# Patient Record
Sex: Male | Born: 1940 | Race: Black or African American | Hispanic: No | Smoking: Former smoker
Health system: Southern US, Community
[De-identification: ages and names within clinical notes are randomized; demographics above are authoritative.]

## PROBLEM LIST (undated history)

## (undated) DIAGNOSIS — H409 Unspecified glaucoma: Secondary | ICD-10-CM

## (undated) DIAGNOSIS — F79 Unspecified intellectual disabilities: Secondary | ICD-10-CM

## (undated) DIAGNOSIS — H3400 Transient retinal artery occlusion, unspecified eye: Secondary | ICD-10-CM

## (undated) DIAGNOSIS — I10 Essential (primary) hypertension: Secondary | ICD-10-CM

## (undated) DIAGNOSIS — D472 Monoclonal gammopathy: Secondary | ICD-10-CM

## (undated) DIAGNOSIS — D518 Other vitamin B12 deficiency anemias: Secondary | ICD-10-CM

## (undated) DIAGNOSIS — N259 Disorder resulting from impaired renal tubular function, unspecified: Secondary | ICD-10-CM

## (undated) DIAGNOSIS — D638 Anemia in other chronic diseases classified elsewhere: Secondary | ICD-10-CM

## (undated) DIAGNOSIS — J984 Other disorders of lung: Secondary | ICD-10-CM

## (undated) HISTORY — DX: Transient retinal artery occlusion, unspecified eye: H34.00

## (undated) HISTORY — DX: Monoclonal gammopathy: D47.2

## (undated) HISTORY — DX: Other disorders of lung: J98.4

## (undated) HISTORY — DX: Essential (primary) hypertension: I10

## (undated) HISTORY — DX: Unspecified glaucoma: H40.9

## (undated) HISTORY — DX: Anemia in other chronic diseases classified elsewhere: D63.8

## (undated) HISTORY — DX: Unspecified intellectual disabilities: F79

## (undated) HISTORY — DX: Disorder resulting from impaired renal tubular function, unspecified: N25.9

## (undated) HISTORY — DX: Other vitamin B12 deficiency anemias: D51.8

---
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
## 1998-11-12 ENCOUNTER — Ambulatory Visit (HOSPITAL_BASED_OUTPATIENT_CLINIC_OR_DEPARTMENT_OTHER): Admission: RE | Admit: 1998-11-12 | Discharge: 1998-11-12 | Payer: Self-pay | Admitting: Oral Surgery

## 2005-03-24 ENCOUNTER — Ambulatory Visit: Payer: Self-pay | Admitting: Internal Medicine

## 2005-04-14 ENCOUNTER — Ambulatory Visit: Payer: Self-pay | Admitting: Internal Medicine

## 2005-04-24 ENCOUNTER — Ambulatory Visit: Payer: Self-pay | Admitting: Internal Medicine

## 2005-04-29 ENCOUNTER — Ambulatory Visit: Payer: Self-pay | Admitting: Hospitalist

## 2005-06-30 ENCOUNTER — Ambulatory Visit: Payer: Self-pay | Admitting: Internal Medicine

## 2005-06-30 ENCOUNTER — Ambulatory Visit (HOSPITAL_COMMUNITY): Admission: RE | Admit: 2005-06-30 | Discharge: 2005-06-30 | Payer: Self-pay | Admitting: Internal Medicine

## 2005-07-21 ENCOUNTER — Ambulatory Visit: Payer: Self-pay | Admitting: Internal Medicine

## 2005-09-01 ENCOUNTER — Ambulatory Visit: Payer: Self-pay | Admitting: Internal Medicine

## 2006-02-17 ENCOUNTER — Ambulatory Visit: Payer: Self-pay | Admitting: Internal Medicine

## 2006-05-08 DIAGNOSIS — F79 Unspecified intellectual disabilities: Secondary | ICD-10-CM

## 2006-05-08 DIAGNOSIS — N184 Chronic kidney disease, stage 4 (severe): Secondary | ICD-10-CM

## 2006-05-08 DIAGNOSIS — I10 Essential (primary) hypertension: Secondary | ICD-10-CM | POA: Insufficient documentation

## 2006-05-08 DIAGNOSIS — K649 Unspecified hemorrhoids: Secondary | ICD-10-CM | POA: Insufficient documentation

## 2006-05-08 DIAGNOSIS — D638 Anemia in other chronic diseases classified elsewhere: Secondary | ICD-10-CM

## 2006-05-08 DIAGNOSIS — H409 Unspecified glaucoma: Secondary | ICD-10-CM | POA: Insufficient documentation

## 2006-05-08 DIAGNOSIS — N259 Disorder resulting from impaired renal tubular function, unspecified: Secondary | ICD-10-CM

## 2006-05-08 HISTORY — DX: Disorder resulting from impaired renal tubular function, unspecified: N25.9

## 2006-05-08 HISTORY — DX: Unspecified intellectual disabilities: F79

## 2006-05-08 HISTORY — DX: Anemia in other chronic diseases classified elsewhere: D63.8

## 2006-05-08 HISTORY — DX: Essential (primary) hypertension: I10

## 2006-05-08 HISTORY — DX: Unspecified glaucoma: H40.9

## 2006-10-27 ENCOUNTER — Telehealth: Payer: Self-pay | Admitting: *Deleted

## 2007-01-05 ENCOUNTER — Ambulatory Visit: Payer: Self-pay | Admitting: Internal Medicine

## 2007-01-05 DIAGNOSIS — R011 Cardiac murmur, unspecified: Secondary | ICD-10-CM

## 2007-01-06 ENCOUNTER — Encounter (INDEPENDENT_AMBULATORY_CARE_PROVIDER_SITE_OTHER): Payer: Self-pay | Admitting: Internal Medicine

## 2007-01-12 ENCOUNTER — Encounter (INDEPENDENT_AMBULATORY_CARE_PROVIDER_SITE_OTHER): Payer: Self-pay | Admitting: Internal Medicine

## 2007-01-12 DIAGNOSIS — D518 Other vitamin B12 deficiency anemias: Secondary | ICD-10-CM

## 2007-01-12 HISTORY — DX: Other vitamin B12 deficiency anemias: D51.8

## 2007-01-14 ENCOUNTER — Telehealth: Payer: Self-pay | Admitting: *Deleted

## 2007-01-14 ENCOUNTER — Encounter: Payer: Self-pay | Admitting: *Deleted

## 2007-01-14 ENCOUNTER — Ambulatory Visit: Payer: Self-pay | Admitting: Internal Medicine

## 2007-01-15 ENCOUNTER — Ambulatory Visit: Payer: Self-pay | Admitting: Hospitalist

## 2007-01-18 ENCOUNTER — Ambulatory Visit: Payer: Self-pay | Admitting: Internal Medicine

## 2007-01-19 ENCOUNTER — Ambulatory Visit: Payer: Self-pay | Admitting: Internal Medicine

## 2007-01-20 ENCOUNTER — Ambulatory Visit: Payer: Self-pay | Admitting: Internal Medicine

## 2007-01-21 ENCOUNTER — Ambulatory Visit: Payer: Self-pay | Admitting: Internal Medicine

## 2007-01-22 ENCOUNTER — Ambulatory Visit: Payer: Self-pay | Admitting: Internal Medicine

## 2007-01-29 ENCOUNTER — Ambulatory Visit: Payer: Self-pay | Admitting: Hospitalist

## 2007-02-05 ENCOUNTER — Ambulatory Visit: Payer: Self-pay | Admitting: Infectious Diseases

## 2007-02-12 ENCOUNTER — Ambulatory Visit: Payer: Self-pay | Admitting: *Deleted

## 2007-02-19 ENCOUNTER — Ambulatory Visit: Payer: Self-pay | Admitting: Infectious Diseases

## 2007-02-26 ENCOUNTER — Ambulatory Visit: Payer: Self-pay | Admitting: Internal Medicine

## 2007-05-19 ENCOUNTER — Encounter (INDEPENDENT_AMBULATORY_CARE_PROVIDER_SITE_OTHER): Payer: Self-pay | Admitting: Internal Medicine

## 2007-05-19 ENCOUNTER — Ambulatory Visit: Payer: Self-pay | Admitting: Internal Medicine

## 2007-05-19 DIAGNOSIS — R259 Unspecified abnormal involuntary movements: Secondary | ICD-10-CM | POA: Insufficient documentation

## 2007-05-19 LAB — CONVERTED CEMR LAB
Calcium: 8.8 mg/dL (ref 8.4–10.5)
Glucose, Bld: 95 mg/dL (ref 70–99)
MCHC: 33.7 g/dL (ref 30.0–36.0)
MCV: 92.8 fL (ref 78.0–100.0)
Platelets: 197 10*3/uL (ref 150–400)
Potassium: 4.3 meq/L (ref 3.5–5.3)
RBC: 3.61 M/uL — ABNORMAL LOW (ref 4.22–5.81)
RDW: 13 % (ref 11.5–15.5)
Sodium: 144 meq/L (ref 135–145)
TSH: 1.296 microintl units/mL (ref 0.350–5.50)
Vitamin B-12: >2000 pg/mL — ABNORMAL HIGH (ref 211–911)

## 2007-06-18 ENCOUNTER — Ambulatory Visit: Payer: Self-pay | Admitting: Internal Medicine

## 2007-07-19 ENCOUNTER — Ambulatory Visit: Payer: Self-pay | Admitting: *Deleted

## 2007-07-22 ENCOUNTER — Encounter (INDEPENDENT_AMBULATORY_CARE_PROVIDER_SITE_OTHER): Payer: Self-pay | Admitting: Internal Medicine

## 2007-07-22 ENCOUNTER — Ambulatory Visit: Payer: Self-pay | Admitting: *Deleted

## 2007-07-22 LAB — CONVERTED CEMR LAB
BUN: 23 mg/dL (ref 6–23)
Chloride: 106 meq/L (ref 96–112)
Cholesterol: 150 mg/dL (ref 0–200)
HDL: 52 mg/dL (ref >39)
Potassium: 4.6 meq/L (ref 3.5–5.3)
Triglycerides: 58 mg/dL (ref <150)
VLDL: 12 mg/dL (ref 0–40)

## 2007-08-05 ENCOUNTER — Ambulatory Visit: Payer: Self-pay | Admitting: Infectious Disease

## 2007-08-17 ENCOUNTER — Ambulatory Visit: Payer: Self-pay | Admitting: Internal Medicine

## 2007-08-17 ENCOUNTER — Encounter (INDEPENDENT_AMBULATORY_CARE_PROVIDER_SITE_OTHER): Payer: Self-pay | Admitting: Internal Medicine

## 2007-08-17 LAB — CONVERTED CEMR LAB
HDL: 48 mg/dL (ref >39)
LDL Cholesterol: 90 mg/dL (ref 0–99)
Total CHOL/HDL Ratio: 3

## 2007-09-01 ENCOUNTER — Encounter (INDEPENDENT_AMBULATORY_CARE_PROVIDER_SITE_OTHER): Payer: Self-pay | Admitting: Internal Medicine

## 2007-09-01 ENCOUNTER — Ambulatory Visit (HOSPITAL_COMMUNITY): Admission: RE | Admit: 2007-09-01 | Discharge: 2007-09-01 | Payer: Self-pay | Admitting: Infectious Disease

## 2007-09-01 ENCOUNTER — Ambulatory Visit: Payer: Self-pay | Admitting: Infectious Disease

## 2007-09-01 DIAGNOSIS — R05 Cough: Secondary | ICD-10-CM

## 2007-09-01 DIAGNOSIS — R059 Cough, unspecified: Secondary | ICD-10-CM | POA: Insufficient documentation

## 2007-09-01 LAB — CONVERTED CEMR LAB
BUN: 22 mg/dL (ref 6–23)
CO2: 24 meq/L (ref 19–32)
Chloride: 109 meq/L (ref 96–112)
Glucose, Bld: 89 mg/dL (ref 70–99)
Lymphocytes Relative: 51 % — ABNORMAL HIGH (ref 12–46)
Lymphs Abs: 1.8 10*3/uL (ref 0.7–4.0)
Monocytes Relative: 5 % (ref 3–12)
Neutrophils Relative %: 40 % — ABNORMAL LOW (ref 43–77)
Platelets: 225 10*3/uL (ref 150–400)
Potassium: 4.7 meq/L (ref 3.5–5.3)
RBC: 3.77 M/uL — ABNORMAL LOW (ref 4.22–5.81)
Sodium: 145 meq/L (ref 135–145)
WBC: 3.5 10*3/uL — ABNORMAL LOW (ref 4.0–10.5)

## 2007-09-15 ENCOUNTER — Ambulatory Visit: Payer: Self-pay | Admitting: *Deleted

## 2007-09-15 ENCOUNTER — Encounter (INDEPENDENT_AMBULATORY_CARE_PROVIDER_SITE_OTHER): Payer: Self-pay | Admitting: Internal Medicine

## 2007-10-15 ENCOUNTER — Ambulatory Visit: Payer: Self-pay | Admitting: Internal Medicine

## 2007-10-15 ENCOUNTER — Encounter (INDEPENDENT_AMBULATORY_CARE_PROVIDER_SITE_OTHER): Payer: Self-pay | Admitting: Internal Medicine

## 2007-10-15 DIAGNOSIS — R911 Solitary pulmonary nodule: Secondary | ICD-10-CM | POA: Insufficient documentation

## 2007-10-15 DIAGNOSIS — J984 Other disorders of lung: Secondary | ICD-10-CM

## 2007-10-15 DIAGNOSIS — D472 Monoclonal gammopathy: Secondary | ICD-10-CM

## 2007-10-15 HISTORY — DX: Other disorders of lung: J98.4

## 2007-10-15 HISTORY — DX: Monoclonal gammopathy: D47.2

## 2007-10-15 LAB — CONVERTED CEMR LAB
BUN: 29 mg/dL — ABNORMAL HIGH (ref 6–23)
CO2: 25 meq/L (ref 19–32)
Chloride: 107 meq/L (ref 96–112)
Potassium: 4.8 meq/L (ref 3.5–5.3)

## 2007-10-16 ENCOUNTER — Ambulatory Visit (HOSPITAL_COMMUNITY): Admission: RE | Admit: 2007-10-16 | Discharge: 2007-10-16 | Payer: Self-pay | Admitting: Internal Medicine

## 2007-10-18 ENCOUNTER — Encounter (INDEPENDENT_AMBULATORY_CARE_PROVIDER_SITE_OTHER): Payer: Self-pay | Admitting: Internal Medicine

## 2007-10-22 ENCOUNTER — Encounter: Admission: RE | Admit: 2007-10-22 | Discharge: 2007-10-22 | Payer: Self-pay | Admitting: Internal Medicine

## 2007-10-26 ENCOUNTER — Ambulatory Visit: Payer: Self-pay | Admitting: Oncology

## 2007-11-10 LAB — CBC WITH DIFFERENTIAL/PLATELET
BASO%: 0.6 % (ref 0.0–2.0)
EOS%: 3.4 % (ref 0.0–7.0)
Eosinophils Absolute: 0.1 10*3/uL (ref 0.0–0.5)
LYMPH%: 48 % (ref 14.0–48.0)
MCH: 32 pg (ref 28.0–33.4)
MCHC: 33.6 g/dL (ref 32.0–35.9)
MCV: 95.4 fL (ref 81.6–98.0)
MONO%: 6.4 % (ref 0.0–13.0)
NEUT#: 1.5 10*3/uL (ref 1.5–6.5)
RBC: 3.63 10*6/uL — ABNORMAL LOW (ref 4.20–5.71)
RDW: 12.3 % (ref 11.2–14.6)

## 2007-11-10 LAB — ERYTHROCYTE SEDIMENTATION RATE: Sed Rate: 16 mm/hr (ref 0–20)

## 2007-11-12 LAB — COMPREHENSIVE METABOLIC PANEL
ALT: 13 U/L (ref 0–53)
AST: 14 U/L (ref 0–37)
Alkaline Phosphatase: 68 U/L (ref 39–117)
Creatinine, Ser: 1.72 mg/dL — ABNORMAL HIGH (ref 0.40–1.50)
Sodium: 146 mEq/L — ABNORMAL HIGH (ref 135–145)
Total Bilirubin: 0.4 mg/dL (ref 0.3–1.2)
Total Protein: 6.4 g/dL (ref 6.0–8.3)

## 2007-11-12 LAB — SPEP & IFE WITH QIG
Alpha-1-Globulin: 4.2 % (ref 2.9–4.9)
Alpha-2-Globulin: 9.3 % (ref 7.1–11.8)
Beta 2: 11.2 % — ABNORMAL HIGH (ref 3.2–6.5)
Gamma Globulin: 13.2 % (ref 11.1–18.8)
IgG (Immunoglobin G), Serum: 1040 mg/dL (ref 694–1618)
IgM, Serum: 38 mg/dL — ABNORMAL LOW (ref 60–263)
M-Spike, %: 0.29 g/dL

## 2007-11-12 LAB — BETA 2 MICROGLOBULIN, SERUM: Beta-2 Microglobulin: 2.16 mg/L — ABNORMAL HIGH (ref 1.01–1.73)

## 2007-11-12 LAB — KAPPA/LAMBDA LIGHT CHAINS: Kappa:Lambda Ratio: 0.59 (ref 0.26–1.65)

## 2007-11-24 ENCOUNTER — Ambulatory Visit: Payer: Self-pay | Admitting: Internal Medicine

## 2007-11-24 ENCOUNTER — Encounter (INDEPENDENT_AMBULATORY_CARE_PROVIDER_SITE_OTHER): Payer: Self-pay | Admitting: Internal Medicine

## 2007-11-24 LAB — CONVERTED CEMR LAB
BUN: 27 mg/dL — ABNORMAL HIGH (ref 6–23)
Glucose, Bld: 96 mg/dL (ref 70–99)
Potassium: 5 meq/L (ref 3.5–5.3)

## 2007-11-24 LAB — UIFE/LIGHT CHAINS/TP QN, 24-HR UR
Albumin, U: DETECTED
Alpha 2, Urine: DETECTED — AB
Beta, Urine: DETECTED — AB
Free Lambda Lt Chains,Ur: 1.2 mg/dL — ABNORMAL HIGH (ref 0.08–1.01)
Gamma Globulin, Urine: DETECTED — AB
Total Protein, Urine-Ur/day: 81 mg/d (ref 10–140)
Volume, Urine: 700 mL

## 2007-12-24 ENCOUNTER — Encounter (INDEPENDENT_AMBULATORY_CARE_PROVIDER_SITE_OTHER): Payer: Self-pay | Admitting: Internal Medicine

## 2007-12-24 ENCOUNTER — Ambulatory Visit: Payer: Self-pay | Admitting: Internal Medicine

## 2007-12-24 DIAGNOSIS — H3400 Transient retinal artery occlusion, unspecified eye: Secondary | ICD-10-CM

## 2007-12-24 HISTORY — DX: Transient retinal artery occlusion, unspecified eye: H34.00

## 2008-01-04 ENCOUNTER — Ambulatory Visit: Payer: Self-pay | Admitting: Surgery

## 2008-01-04 ENCOUNTER — Ambulatory Visit (HOSPITAL_COMMUNITY): Admission: RE | Admit: 2008-01-04 | Discharge: 2008-01-04 | Payer: Self-pay | Admitting: Internal Medicine

## 2008-01-04 ENCOUNTER — Encounter (INDEPENDENT_AMBULATORY_CARE_PROVIDER_SITE_OTHER): Payer: Self-pay | Admitting: Internal Medicine

## 2008-03-09 ENCOUNTER — Encounter (INDEPENDENT_AMBULATORY_CARE_PROVIDER_SITE_OTHER): Payer: Self-pay | Admitting: Internal Medicine

## 2008-03-16 ENCOUNTER — Ambulatory Visit: Payer: Self-pay | Admitting: Internal Medicine

## 2008-03-16 ENCOUNTER — Encounter (INDEPENDENT_AMBULATORY_CARE_PROVIDER_SITE_OTHER): Payer: Self-pay | Admitting: *Deleted

## 2008-03-16 DIAGNOSIS — I499 Cardiac arrhythmia, unspecified: Secondary | ICD-10-CM | POA: Insufficient documentation

## 2008-03-16 LAB — CONVERTED CEMR LAB
BUN: 26 mg/dL — ABNORMAL HIGH (ref 6–23)
Calcium: 9.2 mg/dL (ref 8.4–10.5)
Glucose, Bld: 93 mg/dL (ref 70–99)
Sodium: 141 meq/L (ref 135–145)
TSH: 2.154 microintl units/mL (ref 0.350–4.50)

## 2008-03-31 ENCOUNTER — Ambulatory Visit: Payer: Self-pay | Admitting: Internal Medicine

## 2008-04-12 ENCOUNTER — Encounter (INDEPENDENT_AMBULATORY_CARE_PROVIDER_SITE_OTHER): Payer: Self-pay | Admitting: Internal Medicine

## 2008-04-12 ENCOUNTER — Ambulatory Visit: Payer: Self-pay | Admitting: Internal Medicine

## 2008-04-13 LAB — CONVERTED CEMR LAB
Calcium: 8.8 mg/dL (ref 8.4–10.5)
Chloride: 106 meq/L (ref 96–112)
Creatinine, Ser: 1.96 mg/dL — ABNORMAL HIGH (ref 0.40–1.50)

## 2008-05-03 ENCOUNTER — Ambulatory Visit: Payer: Self-pay | Admitting: Internal Medicine

## 2008-05-03 ENCOUNTER — Encounter (INDEPENDENT_AMBULATORY_CARE_PROVIDER_SITE_OTHER): Payer: Self-pay | Admitting: Internal Medicine

## 2008-05-03 LAB — CONVERTED CEMR LAB
BUN: 26 mg/dL — ABNORMAL HIGH (ref 6–23)
Chloride: 107 meq/L (ref 96–112)
Glucose, Bld: 93 mg/dL (ref 70–99)
Hemoglobin: 11.2 g/dL — ABNORMAL LOW (ref 13.0–17.0)
Potassium: 5.1 meq/L (ref 3.5–5.3)
RBC: 3.63 M/uL — ABNORMAL LOW (ref 4.22–5.81)

## 2008-05-11 ENCOUNTER — Ambulatory Visit (HOSPITAL_COMMUNITY): Admission: RE | Admit: 2008-05-11 | Discharge: 2008-05-11 | Payer: Self-pay | Admitting: Internal Medicine

## 2008-05-23 ENCOUNTER — Ambulatory Visit (HOSPITAL_BASED_OUTPATIENT_CLINIC_OR_DEPARTMENT_OTHER): Payer: 59 | Admitting: Oncology

## 2008-05-24 ENCOUNTER — Encounter (INDEPENDENT_AMBULATORY_CARE_PROVIDER_SITE_OTHER): Payer: Self-pay | Admitting: Internal Medicine

## 2008-05-24 ENCOUNTER — Ambulatory Visit: Payer: Self-pay | Admitting: Internal Medicine

## 2008-05-29 LAB — CONVERTED CEMR LAB
CO2: 26 meq/L (ref 19–32)
Calcium: 8.8 mg/dL (ref 8.4–10.5)
Creatinine, Ser: 1.6 mg/dL — ABNORMAL HIGH (ref 0.40–1.50)
Glucose, Bld: 94 mg/dL (ref 70–99)

## 2008-07-14 ENCOUNTER — Encounter (INDEPENDENT_AMBULATORY_CARE_PROVIDER_SITE_OTHER): Payer: Self-pay | Admitting: Internal Medicine

## 2008-10-03 ENCOUNTER — Emergency Department (HOSPITAL_COMMUNITY): Admission: EM | Admit: 2008-10-03 | Discharge: 2008-10-03 | Payer: Self-pay | Admitting: Family Medicine

## 2009-02-05 ENCOUNTER — Ambulatory Visit: Payer: Self-pay | Admitting: Internal Medicine

## 2009-02-05 LAB — CONVERTED CEMR LAB
ALT: 9 units/L (ref 0–53)
AST: 15 units/L (ref 0–37)
CO2: 27 meq/L (ref 19–32)
Cholesterol: 126 mg/dL (ref 0–200)
Creatinine, Ser: 1.56 mg/dL — ABNORMAL HIGH (ref 0.40–1.50)
HDL: 44 mg/dL (ref >39)
Total Bilirubin: 0.5 mg/dL (ref 0.3–1.2)
Total CHOL/HDL Ratio: 2.9
VLDL: 13 mg/dL (ref 0–40)

## 2009-02-06 ENCOUNTER — Ambulatory Visit: Payer: Self-pay | Admitting: Internal Medicine

## 2009-02-08 ENCOUNTER — Ambulatory Visit (HOSPITAL_COMMUNITY): Admission: RE | Admit: 2009-02-08 | Discharge: 2009-02-08 | Payer: Self-pay | Admitting: Internal Medicine

## 2009-02-19 ENCOUNTER — Ambulatory Visit: Payer: Self-pay | Admitting: Internal Medicine

## 2009-02-19 LAB — CONVERTED CEMR LAB: Crypto Ag: NEGATIVE

## 2009-03-06 ENCOUNTER — Ambulatory Visit: Payer: Self-pay | Admitting: Infectious Disease

## 2009-03-13 ENCOUNTER — Ambulatory Visit: Payer: Self-pay | Admitting: Internal Medicine

## 2009-03-13 DIAGNOSIS — J82 Pulmonary eosinophilia, not elsewhere classified: Secondary | ICD-10-CM

## 2009-03-13 DIAGNOSIS — J8289 Other pulmonary eosinophilia, not elsewhere classified: Secondary | ICD-10-CM | POA: Insufficient documentation

## 2009-04-10 ENCOUNTER — Ambulatory Visit: Payer: Self-pay | Admitting: Internal Medicine

## 2009-04-10 ENCOUNTER — Telehealth (INDEPENDENT_AMBULATORY_CARE_PROVIDER_SITE_OTHER): Payer: Self-pay | Admitting: *Deleted

## 2009-04-12 ENCOUNTER — Encounter: Payer: Self-pay | Admitting: Internal Medicine

## 2009-04-13 ENCOUNTER — Ambulatory Visit: Payer: Self-pay | Admitting: Internal Medicine

## 2009-04-24 ENCOUNTER — Ambulatory Visit: Payer: Self-pay | Admitting: Internal Medicine

## 2009-04-25 ENCOUNTER — Encounter (INDEPENDENT_AMBULATORY_CARE_PROVIDER_SITE_OTHER): Payer: Self-pay | Admitting: Internal Medicine

## 2009-04-25 LAB — CONVERTED CEMR LAB
BUN: 21 mg/dL (ref 6–23)
Chloride: 105 meq/L (ref 96–112)
Creatinine, Ser: 1.56 mg/dL — ABNORMAL HIGH (ref 0.40–1.50)
Glucose, Bld: 106 mg/dL — ABNORMAL HIGH (ref 70–99)

## 2009-04-30 ENCOUNTER — Ambulatory Visit: Payer: Self-pay | Admitting: Internal Medicine

## 2009-05-16 ENCOUNTER — Ambulatory Visit: Payer: Self-pay | Admitting: Internal Medicine

## 2009-05-22 ENCOUNTER — Encounter (INDEPENDENT_AMBULATORY_CARE_PROVIDER_SITE_OTHER): Payer: Self-pay | Admitting: Internal Medicine

## 2009-05-22 ENCOUNTER — Ambulatory Visit (HOSPITAL_COMMUNITY): Admission: RE | Admit: 2009-05-22 | Discharge: 2009-05-22 | Payer: Self-pay | Admitting: Internal Medicine

## 2009-05-22 ENCOUNTER — Ambulatory Visit: Payer: Self-pay | Admitting: Internal Medicine

## 2009-08-24 ENCOUNTER — Encounter (INDEPENDENT_AMBULATORY_CARE_PROVIDER_SITE_OTHER): Payer: Self-pay | Admitting: *Deleted

## 2009-10-12 ENCOUNTER — Ambulatory Visit: Payer: Self-pay | Admitting: Internal Medicine

## 2009-10-12 ENCOUNTER — Telehealth (INDEPENDENT_AMBULATORY_CARE_PROVIDER_SITE_OTHER): Payer: Self-pay | Admitting: *Deleted

## 2009-10-13 ENCOUNTER — Encounter: Payer: Self-pay | Admitting: Internal Medicine

## 2009-10-17 ENCOUNTER — Encounter: Payer: Self-pay | Admitting: Internal Medicine

## 2010-03-29 ENCOUNTER — Ambulatory Visit: Payer: Self-pay | Admitting: Internal Medicine

## 2010-04-15 ENCOUNTER — Ambulatory Visit: Payer: Self-pay | Admitting: Internal Medicine

## 2010-05-19 ENCOUNTER — Encounter: Payer: Self-pay | Admitting: Internal Medicine

## 2010-05-26 LAB — CONVERTED CEMR LAB
Albumin ELP: 58 % (ref 55.8–66.1)
Albumin, U: DETECTED %
Alpha 1, Urine: DETECTED % — AB
Alpha 2, Urine: DETECTED % — AB
Anti Nuclear Antibody(ANA): NEGATIVE
Free Lambda Lt Chains,Ur: 1.54 mg/dL — ABNORMAL HIGH (ref 0.08–1.01)
Gamma Globulin: 12.8 % (ref 11.1–18.8)
IgA: 558 mg/dL — ABNORMAL HIGH (ref 68–378)
IgM, Serum: 37 mg/dL — ABNORMAL LOW (ref 60–263)
Total Protein, Serum Electrophoresis: 6.5 g/dL (ref 6.0–8.3)

## 2010-05-30 NOTE — Assessment & Plan Note (Signed)
Summary: FLU SHOT/CH  Nurse Visit   Allergies: No Known Drug Allergies  Orders Added: 1)  Admin 1st Vaccine [90471] 2)  Flu Vaccine 57yrs + [id number] Flu Vaccine Consent Questions     Do you have a history of severe allergic reactions to this vaccine? no    Any prior history of allergic reactions to egg and/or gelatin? no    Do you have a sensitivity to the preservative Thimersol? no    Do you have a past history of Guillan-Barre Syndrome? no    Do you currently have an acute febrile illness? no    Have you ever had a severe reaction to latex? no    Vaccine information given and explained to patient? yes    Are you currently pregnant? no    Lot Number:[id number]   Exp Date:10/26/2010   Manufacturer: [employer]    Site Given  right Deltoid IM Vaccine 76yrs + [id number] .opcflu

## 2010-05-30 NOTE — Assessment & Plan Note (Signed)
Summary: Pulmonary/ acute ext ov for ? uri with hfa teaching   Copy to:  Dr. Grier Rocher Primary Provider/Referring Provider:  Flo Shanks  CC:  Acute visit.  Pt c/o prod cough with clear/yellow sputum x 1 wk.  He also c/o HA since cough started as well as pain in sides.  .  History of Present Illness: 14 yobm quit smoking "years ago" with chronic cavitary changes in the LUL since at least 09/23/08.  March 13, 2009 cc cough x months to point of choking, minimal yellow mucus.  No h/o hemoptysis. rec stop lisinopril Start benicar 40 mg one daily in place of Lisonopril   April 13, 2009 1 month followup with cxr.  Pt states that cough resolved  w/in 3 days of stopping lisinopril.   April 30, 2009 Acute visit.  Pt c/o prod cough with clear/yellow sputum x 1 wk.  He also c/o HA since cough started as well as pain in sides and anteriorly with coughing only.  Pt denies any significant sore throat, dysphagia, itching, sneezing,  nasal congestion or excess or purulent nasal  secretions,  fever, chills, sweats, unintended wt loss, pleuritic or exertional cp, hempoptysis, change in activity tolerance  orthopnea pnd or leg swelling.  Pt also denies any obvious fluctuation in symptoms with weather or environmental change or other alleviating or aggravating factors except albuterol helps some.   Current Medications (verified): 1)  Anacin 81 Mg  Tbec (Aspirin) .... Take 1 Pill By Mouth Daily. 2)  Ventolin Hfa 108 (90 Base) Mcg/act  Aers (Albuterol Sulfate) .... Take 2 Puffs Inhalation As Needed For Any Respiratory Dificulty. 3)  Travatan Z 0.004 % Soln (Travoprost) .... Place One Drop in Each Eye At Bedtime 4)  Dorzolamide-Timolol 2-0.5 % Soln (Dorzolamide-Timolol) .... Put One Drop Into Each Eye Twice A Day 5)  Benicar 40 Mg  Tabs (Olmesartan Medoxomil) .... One Tablet By Mouth Daily  Allergies (verified): No Known Drug Allergies  Past History:  Past Medical History: Allergic  rhinitis Hypertension    - ACE D/C March 13, 2009 (cough) > much better Renal insufficiency, chronic, mild  Mental retardation Internal hemorrhoids by colonoscopy 2004 Glaucoma Anemia of chronic disease per workup 2005 Cerumen impaction LUL cavitary density since 09/23/08.........................................Marland KitchenWert f/u    - f/u cxr April 13, 2009 no change  Vital Signs:  Patient profile:   70 year old male Weight:      122.38 pounds O2 Sat:      99 % on Room air Temp:     97.9 degrees F oral Pulse rate:   123 / minute BP sitting:   122 / 80  (left arm)  Vitals Entered By: Tilden Dome (April 30, 2009 4:28 PM)  O2 Flow:  Room air CC: Acute visit.  Pt c/o prod cough with clear/yellow sputum x 1 wk.  He also c/o HA since cough started as well as pain in sides.     Physical Exam  Additional Exam:  thin amb bm  childlike affect harsh cough wt 123 March 13, 2009 vs 118 2008  >  128 March 28, 2009 > 122 April 30, 2009   HEENT: edentulous, nl  turbinates, and orophanx. Nl external ear canals without cough reflex NECK :  without JVD/Nodes/TM/ nl carotid upstrokes bilaterally LUNGS: no acc muscle use,  mild  insp and exp rhonchi diffusely CV:  RRR  no s3 or murmur or increase in P2, no edema  ABD:  soft and nontender  with nl excursion in the supine position. No bruits or organomegaly, bowel sounds nl MS:  warm without deformities, calf tenderness, cyanosis or clubbing      Impression & Recommendations:  Problem # 1:  COUGH (ICD-786.2)  I spent extra time with the patient today explaining optimal mdi  technique.  This improved from  25-75%.  Believe there is a mild asthmatic component here triggered by uri  Orders: Est. Patient Level IV [id number])  Problem # 2:  PULMONARY INFILTRATE INCLUDES (EOSINOPHILIA) (ICD-518.3)  Will empirircally rx with 5 days Levaquin then return for cxr  Orders: Est. Patient Level IV [id number])  Medications Added to Medication List  This Visit: 1)  Levaquin 750 Mg Tabs (Levofloxacin) .... One tablet by mouth daily 2)  Prednisone 10 Mg Tabs (Prednisone) .... 4 each am x 2days, 2x2days, 1x2days and stop  Patient Instructions: 1)  Levaquin 750 one daily x 5 days 2)  Proaire  = Ventolin 2 puffs every 4 hours as needed cough, congestion short of breath wheezing 3)  mucinex dm per bottle instead of robitussin when cough is bad 4)  Please schedule a follow-up appointment in 2 weeks with cxr, return sooner if condition worsens Prescriptions: PREDNISONE 10 MG  TABS (PREDNISONE) 4 each am x 2days, 2x2days, 1x2days and stop  #14 x 0   Entered and Authorized by:   Tanda Rockers MD   Signed by:   Tanda Rockers MD on 05/01/2009   Method used:   Electronically to        [facility] (retail)       [place], [place]  [address]       Ph: [phone]       Fax: [phone]   RxID:   [id number] LEVAQUIN 750 MG  TABS (LEVOFLOXACIN) One tablet by mouth daily  #5 x 0   Entered and Authorized by:   Tanda Rockers MD   Signed by:   Tanda Rockers MD on 05/01/2009   Method used:   Electronically to        [facility] (retail)       [address]       [place], [place]  [address]       Ph: [phone]       Fax: [phone]   RxID:   [id number]

## 2010-05-30 NOTE — Letter (Signed)
Summary: Colonoscopy Date Change Letter  [facility]  [place], [place] [address]   Phone: [phone]  Fax: [phone]      August 24, 2009 MRN: [id number]   [place], [place]  [address]   Dear Jared Woodward,   Previously you were recommended to have a repeat colonoscopy around this time. Your chart was recently reviewed by Dr. Norberto Sorenson T. Fuller Plan of [facility]. Follow up colonoscopy is now recommended in May 2014. This revised recommendation is based on current, nationally recognized guidelines for colorectal cancer screening and polyp surveillance. These guidelines are endorsed by the [place] Task Force on Colorectal Cancer as well as numerous other major medical organizations.  Please understand that our recommendation assumes that you do not have any new symptoms such as bleeding, a change in bowel habits, anemia, or significant abdominal discomfort. If you do have any concerning GI symptoms or want to discuss the guideline recommendations, please call to arrange an office visit at your earliest convenience. Otherwise we will keep you in our reminder system and contact you 1-2 months prior to the date listed above to schedule your next colonoscopy.  Thank you,  Norberto Sorenson T. Fuller Plan, M.D.  [facility] [phone]

## 2010-05-30 NOTE — Assessment & Plan Note (Signed)
Summary: Pulmonary/ f/u cavitary mass/ cough   Visit Type:  Follow-up Copy to:  Dr. Grier Rocher Primary Provider/Referring Provider:  Flo Shanks  CC:  Pt here for follow up. Pt c/o seeing a trace of blood on tissue in the mornings when cleaning his nose.  History of Present Illness: 35 yobm quit smoking "years ago" with chronic cavitary changes in the LUL since at least 09/24/2007  March 13, 2009 cc cough x months to point of choking, minimal yellow mucus.  No h/o hemoptysis. rec stop lisinopril Start benicar 40 mg one daily in place of Lisonopril   April 13, 2009 1 month followup with cxr.  Pt states that cough resolved  w/in 3 days of stopping lisinopril.   April 30, 2009 Acute visit.  Pt c/o prod cough with clear/yellow sputum x 1 wk.  He also c/o HA since cough started as well as pain in sides and anteriorly with coughing only. rec Levaquin 750 one daily x 5 days Proaire  = Ventolin 2 puffs every 4 hours as needed cough,   May 16, 2009 Pt here for follow up. Pt c/o seeing a trace of blood on tissue in the mornings when cleaning his nose.  no sob. off inhalers for 24hours.  Pt denies any significant sore throat, dysphagia, itching, sneezing,  nasal congestion or excess secretions,  fever, chills, sweats, unintended wt loss, pleuritic or exertional cp, hempoptysis, change in activity tolerance  orthopnea pnd or leg swelling. Pt also denies any obvious fluctuation in symptoms with weather or environmental change or other alleviating or aggravating factors.       Current Medications (verified): 1)  Anacin 81 Mg  Tbec (Aspirin) .... Take 1 Pill By Mouth Daily. 2)  Ventolin Hfa 108 (90 Base) Mcg/act  Aers (Albuterol Sulfate) .... Take 2 Puffs Inhalation As Needed For Any Respiratory Dificulty. 3)  Travatan Z 0.004 % Soln (Travoprost) .... Place One Drop in Each Eye At Bedtime 4)  Dorzolamide-Timolol 2-0.5 % Soln (Dorzolamide-Timolol) .... Put One Drop Into Each Eye Twice A  Day 5)  Benicar 40 Mg  Tabs (Olmesartan Medoxomil) .... One Tablet By Mouth Daily 6)  Mucinex Dm Maximum Strength 60-1200 Mg Xr12h-Tab (Dextromethorphan-Guaifenesin) .... Take 1 Tablet By Mouth Every Morning  Allergies (verified): No Known Drug Allergies  Past History:  Past Medical History: Allergic rhinitis Hypertension    - ACE D/C March 13, 2009 (cough) > much better Renal insufficiency, chronic, mild  Mental retardation Internal hemorrhoids by colonoscopy 2004 Glaucoma Anemia of chronic disease per workup 2005 Cerumen impaction LUL cavitary density since 09/24/07........................................Marland KitchenWert f/u    - f/u cxr April 13, 2009 no change. no change January19, 2011       Vital Signs:  Patient profile:   70 year old male Height:      67 inches Weight:      126.13 pounds O2 Sat:      98 % on Room air Temp:     97.6 degrees F oral Pulse rate:   56 / minute BP sitting:   132 / 74  (left arm) Cuff size:   regular  Vitals Entered By: Iran Planas CMA (May 16, 2009 9:20 AM)  O2 Flow:  Room air CC: Pt here for follow up. Pt c/o seeing a trace of blood on tissue in the mornings when cleaning his nose Comments Medications reviewed with patient Pt contact number verified Iran Planas CMA  May 16, 2009 9:24 AM    Physical Exam  Additional Exam:  thin amb bm  childlike affect harsh cough wt 123 March 13, 2009 vs 118 2008  >  128 March 28, 2009 > 122 April 30, 2009 > 126 May 16, 2009   HEENT: edentulous, nl  turbinates, and orophanx. Nl external ear canals without cough reflex NECK :  without JVD/Nodes/TM/ nl carotid upstrokes bilaterally LUNGS: no acc muscle use,  mild  insp and exp rhonchi diffusely CV:  RRR  no s3 or murmur or increase in P2, no edema  ABD:  soft and nontender with nl excursion in the supine position. No bruits or organomegaly, bowel sounds nl MS:  warm without deformities, calf tenderness, cyanosis or  clubbing      Impression & Recommendations:  Problem # 1:  PULMONARY INFILTRATE INCLUDES (EOSINOPHILIA) (ICD-518.3)  Stable radiographically for almost 2 years now so rec conservative f/u  Discussed in detail all the  indications, usual  risks and alternatives  relative to the benefits with patient who agrees to proceed with conservative f/u  Orders: Est. Patient Level III [id number])  Problem # 2:  COUGH (ICD-786.2)  The most common causes of chronic cough in immunocompetent adults include: upper airway cough syndrome (UACS), previously referred to as postnasal drip syndrome,  caused by variety of rhinosinus conditions; (2) asthma; (3) GERD; (4) chronic bronchitis from cigarette smoking or other inhaled environmental irritants; (5) nonasthmatic eosinophilic bronchitis; and (6) bronchiectasis. These conditions, singly or in combination, have accounted for up to 94% of the causes of chronic cough in prospective studies.  completely resolved,  maintain off ace, doubt chronic asthma or Beta 2 dependence at this point, use as needed   Each maintenance medication was reviewed in detail including most importantly the difference between maintenance and as needed and under what circumstances the prns are to be used. See instructions for specific recommendations   Orders: Est. Patient Level III [id number])  Medications Added to Medication List This Visit: 1)  Mucinex Dm Maximum Strength 60-1200 Mg Xr12h-tab (Dextromethorphan-guaifenesin) .... Take 1 tablet by mouth every morning  Patient Instructions: 1)  ok to use ventolin if needed 2)  Please schedule a follow-up appointment in 3 months, sooner if needed with any old cxr's from [facility] in hand   Immunization History:  Pneumovax Immunization History:    Pneumovax:  historical (01/26/2009)

## 2010-05-30 NOTE — Assessment & Plan Note (Addendum)
Summary: Pulmonary/ ext f/u with ? asthma from timolol hfa 75% p coaching   Copy to:  Dr. Grier Rocher Primary Provider/Referring Provider:  Raphael Gibney Pokharel  CC:  Cough- the same.  History of Present Illness: 36  yobm quit smoking "years ago" with chronic cavitary changes in the LUL since at least 09/24/2007  March 13, 2009 cc cough x months to point of choking, minimal yellow mucus.  No h/o hemoptysis. rec stop lisinopril Start benicar 40 mg one daily in place of Lisonopril   April 13, 2009 1 month followup with cxr.  Pt states that cough resolved  w/in 3 days of stopping lisinopril.   April 30, 2009 Acute visit.  Pt c/o prod cough with clear/yellow sputum x 1 wk.  He also c/o HA since cough started as well as pain in sides and anteriorly with coughing only. rec Levaquin 750 one daily x 5 days Proaire  = Ventolin 2 puffs every 4 hours as needed cough,   May 16, 2009 Pt here for follow up. Pt c/o seeing a trace of blood on tissue in the mornings when cleaning his nose.  no sob. off inhalers for 24hours rec he just use saba as needed.   October 12, 2009 ov Followup.  Pt states that his breathing is fine/ rec f/u every 6 months rec  April 15, 2010 ov cc cough x one week. not able to spit it out.  rx robitussin dm. on timolol eyedrops. Pt denies any significant sore throat, dysphagia, itching, sneezing,  nasal congestion or excess secretions,  fever, chills, sweats, unintended wt loss, pleuritic or exertional cp, hempoptysis, change in activity tolerance  orthopnea pnd or leg swelling. Pt also denies any obvious fluctuation in symptoms with weather or environmental change or other alleviating or aggravating factors.       Current Medications (verified): 1)  Anacin 81 Mg  Tbec (Aspirin) .... Take 1 Pill By Mouth Daily. 2)  Ventolin Hfa 108 (90 Base) Mcg/act  Aers (Albuterol Sulfate) .... Take 2 Puffs Inhalation As Needed For Any Respiratory Dificulty. 3)  Travatan Z 0.004 % Soln  (Travoprost) .... Place One Drop in Each Eye At Bedtime 4)  Dorzolamide-Timolol 2-0.5 % Soln (Dorzolamide-Timolol) .... Put One Drop Into Each Eye Twice A Day 5)  Benicar 40 Mg  Tabs (Olmesartan Medoxomil) .... One Tablet By Mouth Daily 6)  Mucinex Dm Maximum Strength 60-1200 Mg Xr12h-Tab (Dextromethorphan-Guaifenesin) .... Take 1 Tablet By Mouth Every Morning  Allergies (verified): No Known Drug Allergies  Past History:  Past Medical History: Allergic rhinitis ? asthma    - HFA 75% p coaching April 15, 2010  Hypertension    - ACE D/C March 13, 2009 (chronic cough resolved) Renal insufficiency, chronic, mild  Mental retardation Internal hemorrhoids by colonoscopy 2004 Glaucoma..................................................................................Marland KitchenGroat     - rec change timolol to betoptic April 15, 2010  Anemia of chronic disease per workup 2005 Cerumen impaction LUL cavitary density since 09/24/07.........................................Marland KitchenWert f/u    - f/u cxr April 13, 2009 no change. no change 10/12/2009 > repeat April 15, 2010 no change      Vital Signs:  Patient profile:   70 year old male Weight:      123 pounds BMI:     19.33 O2 Sat:      98 % on Room air Temp:     98.1 degrees F oral Pulse rate:   70 / minute BP sitting:   158 / 84  (left arm)  Vitals Entered  ByTilden Dome (April 15, 2010 9:43 AM)  O2 Flow:  Room air  Physical Exam  Additional Exam:  thin amb bm  childlike  affect wt 123 March 13, 2009 vs 118 2008  >  128 March 28, 2009 >  123 April 15, 2010  HEENT: edentulous, nl  turbinates, and orophanx. Nl external ear canals without cough reflex NECK :  without JVD/Nodes/TM/ nl carotid upstrokes bilaterally LUNGS: no acc muscle use,  mild  insp and exp rhonchi diffusely CV:  RRR  no s3 or murmur or increase in P2, no edema  ABD:  soft and nontender with nl excursion in the supine position. No bruits or organomegaly,  bowel sounds nl MS:  warm without deformities, calf tenderness, cyanosis or clubbing      CXR  Procedure date:  04/15/2010  Findings:      02/08/2009  Findings: Focal bolus/emphysematous changes are seen centered in the left mid to upper lung similar appearance compared to the prior chest CT.  A background of emphysema/hyperexpansion is again noted. No new pulmonary nodules are seen but prominent nipple shadows are noted bilaterally.  The aortic contour and heart size is unchanged. The upper abdomen and osseous structures are unremarkable.  IMPRESSION: Unchanged bulla/emphysematous changes seen in the left mid and upper lung.  No new findings  Impression & Recommendations:  Problem # 1:  PULMONARY NODULE (ICD-518.89)  No change by serial cxrs  Problem # 2:  COUGH (ICD-786.2)    DDX of  difficult airways managment all start with A and  include Adherence, Ace Inhibitors, Acid Reflux, Active Sinus Disease, Alpha 1 Antitripsin deficiency, Anxiety masquerading as Airways dz,  ABPA,  allergy(esp in young), Aspiration (esp in elderly), Adverse effects of DPI,  Active smokers, plus one B  = Beta blocker use (especially Timolol)  I spent extra time with the patient today explaining optimal mdi  technique.  This improved from  50-75% but hopefully won't need saba off timolol.  ? acid reflux:  See instructions for specific recommendations   Orders: Est. Patient Level IV [id number])  Problem # 3:  GLAUCOMA (ICD-365.9)  defer rx to Dr Katy Fitch but rec d/c timolol in favor of betoptic  Orders: Est. Patient Level IV [id number])  Other Orders: T-2 View CXR ([id number])  Patient Instructions: 1)  Call your eye doctor for subsitute for timolol eyedrops (these tend to make you wheeze) 2)  Prilosec before bfast and pepcid 20 mg at bedtime as long as coughing ( reflux is to cough what oxygen is to fire)  3)  GERD (REFLUX)  is a common cause of respiratory symptoms. It commonly presents without  heartburn and can be treated with medication, but also with lifestyle changes including avoidance of late meals, excessive alcohol, smoking cessation, and avoid fatty foods, chocolate, peppermint, colas, red wine, and acidic juices such as orange juice. NO MINT OR MENTHOL PRODUCTS SO NO COUGH DROPS  4)  USE SUGARLESS CANDY INSTEAD (jolley ranchers)  5)  NO OIL BASED VITAMINS  6)  Ventolin can be used every 4 hours if needed but not average more than twice a week (same drug basically as xopenex samples) 7)  Return to office in 3 months, sooner if needed

## 2010-05-30 NOTE — Assessment & Plan Note (Signed)
Summary: Pulmonary/ f/u cavity, no cough off ace   Copy to:  Dr. Grier Rocher Primary Provider/Referring Provider:  Flo Shanks  CC:  Followup.  Pt states that his breathing is well and denies any complaints today.Jared Woodward  History of Present Illness: 70 yobm quit smoking "years ago" with chronic cavitary changes in the LUL since at least 09/24/2007  March 13, 2009 cc cough x months to point of choking, minimal yellow mucus.  No h/o hemoptysis. rec stop lisinopril Start benicar 40 mg one daily in place of Lisonopril   April 13, 2009 1 month followup with cxr.  Pt states that cough resolved  w/in 3 days of stopping lisinopril.   April 30, 2009 Acute visit.  Pt c/o prod cough with clear/yellow sputum x 1 wk.  He also c/o HA since cough started as well as pain in sides and anteriorly with coughing only. rec Levaquin 750 one daily x 5 days Proaire  = Ventolin 2 puffs every 4 hours as needed cough,   May 16, 2009 Pt here for follow up. Pt c/o seeing a trace of blood on tissue in the mornings when cleaning his nose.  no sob. off inhalers for 24hours rec he just use saba as needed.   October 12, 2009 ov Followup.  Pt states that his breathing is well and denies any complaints today. Pt denies any significant sore throat, dysphagia, itching, sneezing,  nasal congestion or excess secretions,  fever, chills, sweats, unintended wt loss, pleuritic or exertional cp, hempoptysis, change in activity tolerance  orthopnea pnd or leg swelling   Current Medications (verified): 1)  Anacin 81 Mg  Tbec (Aspirin) .... Take 1 Pill By Mouth Daily. 2)  Ventolin Hfa 108 (90 Base) Mcg/act  Aers (Albuterol Sulfate) .... Take 2 Puffs Inhalation As Needed For Any Respiratory Dificulty. 3)  Travatan Z 0.004 % Soln (Travoprost) .... Place One Drop in Each Eye At Bedtime 4)  Dorzolamide-Timolol 2-0.5 % Soln (Dorzolamide-Timolol) .... Put One Drop Into Each Eye Twice A Day 5)  Benicar 40 Mg  Tabs (Olmesartan Medoxomil)  .... One Tablet By Mouth Daily 6)  Mucinex Dm Maximum Strength 60-1200 Mg Xr12h-Tab (Dextromethorphan-Guaifenesin) .... Take 1 Tablet By Mouth Every Morning  Allergies (verified): No Known Drug Allergies  Past History:  Past Medical History: Allergic rhinitis Hypertension    - ACE D/C March 13, 2009 (cough) > much better Renal insufficiency, chronic, mild  Mental retardation Internal hemorrhoids by colonoscopy 2004 Glaucoma Anemia of chronic disease per workup 2005 Cerumen impaction LUL cavitary density since 09/24/07.........................................Jared KitchenWert f/u    - f/u cxr April 13, 2009 no change. no change 10/12/2009      Vital Signs:  Patient profile:   70 year old male Weight:      123 pounds O2 Sat:      98 % on Room air Temp:     98.2 degrees F oral Pulse rate:   61 / minute BP sitting:   110 / 72  (left arm)  Vitals Entered By: Tilden Dome (October 12, 2009 11:10 AM)  O2 Flow:  Room air  Physical Exam  Additional Exam:  thin amb bm  childlike  affect wt 123 March 13, 2009 vs 118 2008  >  128 March 28, 2009 > 122 April 30, 2009 > 126 May 16, 2009  > 123 October 15, 2009  HEENT: edentulous, nl  turbinates, and orophanx. Nl external ear canals without cough reflex NECK :  without JVD/Nodes/TM/ nl carotid  upstrokes bilaterally LUNGS: no acc muscle use,  mild  insp and exp rhonchi diffusely CV:  RRR  no s3 or murmur or increase in P2, no edema  ABD:  soft and nontender with nl excursion in the supine position. No bruits or organomegaly, bowel sounds nl MS:  warm without deformities, calf tenderness, cyanosis or clubbing      CXR  Procedure date:  10/12/2009  Findings:      Mild emphysematous changes with again identified area of cavitation versus bullous formation in the lateral left mid lung, approximately 5.8 x 4.1 cm, question minimally larger than on previous exam.  Impression & Recommendations:  Problem # 1:  PULMONARY INFILTRATE  INCLUDES (EOSINOPHILIA) (ICD-518.3)  Stable radiographically for almost 2 years now so rec conservative f/u  Discussed in detail all the  indications, usual  risks and alternatives  relative to the benefits with patient who agrees to proceed with conservative f/u  Problem # 2:  HYPERTENSION (ICD-401.9)  His updated medication list for this problem includes:    Benicar 40 Mg Tabs (Olmesartan medoxomil) ..... One tablet by mouth daily  should be able to treat with one half dose benicar at this point  Other Orders: T-2 View CXR ([id number]) Est. Patient Level III [id number])  Patient Instructions: 1)  ok to take benicar 40 mg one half with option of a generic product like generic cozaar if your insurance will pay  2)  Return to office in 6 months, sooner if needed

## 2010-05-30 NOTE — Progress Notes (Signed)
----   Converted from flag ---- ---- 04/13/2009 10:05 AM, Tanda Rockers MD wrote: check to be sure he's had a f/u cxr, otherwise schedule one ------------------------------  cxr will be obtained at [facility] today [place]  October 12, 2009 11:12 AM

## 2010-05-30 NOTE — Assessment & Plan Note (Signed)
Summary: CHECKUP/SB.   Vital Signs:  Patient profile:   70 year old male Height:      67 inches (170.18 cm) Weight:      125.2 pounds (56.91 kg) BMI:     19.68 Temp:     97.0 degrees F (36.11 degrees C) oral Pulse rate:   63 / minute BP sitting:   137 / 78  (left arm)  Vitals Entered By: Hilda Blades Ditzler RN (May 22, 2009 11:11 AM) Is Patient Diabetic? No Pain Assessment Patient in pain? no      Nutritional Status BMI of < 19 = underweight Nutritional Status Detail appetite good  Have you ever been in a relationship where you felt threatened, hurt or afraid?denies   Does patient need assistance? Functional Status Self care Ambulation Normal Comments Relative with him. Ck-up.   Primary Care Provider:  Raphael Gibney Pokharel   History of Present Illness: This is a  year old man with past medical history of   Allergic rhinitis Hypertension    - ACE D/C March 13, 2009 (cough) > much better Renal insufficiency, chronic, mild  Mental retardation Internal hemorrhoids by colonoscopy 2004 Glaucoma Anemia of chronic disease per workup 2005 Cerumen impaction LUL cavitary density since 09/24/07........................................Marland KitchenWert f/u    - f/u cxr April 13, 2009 no change. no change January19, 2011       He is here for a check up.  Has no complaints.  Depression History:      The patient denies a depressed mood most of the day and a diminished interest in his usual daily activities.         Preventive Screening-Counseling & Management  Alcohol-Tobacco     Smoking Status: quit     Year Quit: long time ago  Caffeine-Diet-Exercise     Does Patient Exercise: yes     Type of exercise: RIDE BIKE     Times/week: 7  Current Medications (verified): 1)  Anacin 81 Mg  Tbec (Aspirin) .... Take 1 Pill By Mouth Daily. 2)  Ventolin Hfa 108 (90 Base) Mcg/act  Aers (Albuterol Sulfate) .... Take 2 Puffs Inhalation As Needed For Any Respiratory Dificulty. 3)  Travatan  Z 0.004 % Soln (Travoprost) .... Place One Drop in Each Eye At Bedtime 4)  Dorzolamide-Timolol 2-0.5 % Soln (Dorzolamide-Timolol) .... Put One Drop Into Each Eye Twice A Day 5)  Benicar 40 Mg  Tabs (Olmesartan Medoxomil) .... One Tablet By Mouth Daily 6)  Mucinex Dm Maximum Strength 60-1200 Mg Xr12h-Tab (Dextromethorphan-Guaifenesin) .... Take 1 Tablet By Mouth Every Morning  Allergies (verified): No Known Drug Allergies  Review of Systems       per hpi  Physical Exam  General:  alert and underweight appearing.   Head:  normocephalic and atraumatic.   Eyes:  vision grossly intact, pupils equal, pupils round, and pupils reactive to light.   Mouth:  pharynx pink and moist.   Lungs:  normal respiratory effort and normal breath sounds.   Heart:  normal rate and no murmur.  irregular rhythem. Pulses:  +1 Extremities:  no edema Neurologic:  alert & oriented X3, cranial nerves II-XII intact, strength normal in all extremities, and gait normal.   Psych:  Oriented X3, memory intact for recent and remote, poor eye contact, slightly anxious, and easily distracted.     Impression & Recommendations:  Problem # 1:  ANEMIA, VITAMIN B12 DEFICIENCY NEC (ICD-281.1) Just had lab work.  Not anemic, MCV is normal.  No neuropathy.   Hgb:  11.2 (05/03/2008)   Hct: 34.3 (05/03/2008)   Platelets: 216 (05/03/2008) RBC: 3.63 (05/03/2008)   RDW: 13.3 (05/03/2008)   WBC: 4.0 (05/03/2008) MCV: 94.5 (05/03/2008)   MCHC: 32.7 (05/03/2008) B12: 328 (05/03/2008)   TSH: 2.064 (02/05/2009)  Problem # 2:  HYPERTENSION (ICD-401.9) BP is close to goal.  No changes.  His updated medication list for this problem includes:    Benicar 40 Mg Tabs (Olmesartan medoxomil) ..... One tablet by mouth daily  BP today: 137/78 Prior BP: 132/74 (05/16/2009)  Prior 10 Yr Risk Heart Disease: 11 % (05/03/2008)  Labs Reviewed: K+: 4.9 (04/25/2009) Creat: : 1.56 (04/25/2009)   Chol: 126 (02/05/2009)   HDL: 44 (02/05/2009)    LDL: 69 (02/05/2009)   TG: 65 (02/05/2009)  Problem # 3:  RENAL INSUFFICIENCY (ICD-588.9) recent labs with stable Cr  Problem # 4:  CARDIAC ARRHYTHMIA (ICD-427.9)  EKG today shows occasional PVC's.  Same as prior.   His updated medication list for this problem includes:    Anacin 81 Mg Tbec (Aspirin) .Marland Kitchen... Take 1 pill by mouth daily.  Orders: 12 Lead EKG (12 Lead EKG)  Problem # 5:  COUGH (ICD-786.2) resolved off of ACE  Problem # 6:  Preventive Health Care (ICD-V70.0)  Complete Medication List: 1)  Anacin 81 Mg Tbec (Aspirin) .... Take 1 pill by mouth daily. 2)  Ventolin Hfa 108 (90 Base) Mcg/act Aers (Albuterol sulfate) .... Take 2 puffs inhalation as needed for any respiratory dificulty. 3)  Travatan Z 0.004 % Soln (Travoprost) .... Place one drop in each eye at bedtime 4)  Dorzolamide-timolol 2-0.5 % Soln (Dorzolamide-timolol) .... Put one drop into each eye twice a day 5)  Benicar 40 Mg Tabs (Olmesartan medoxomil) .... One tablet by mouth daily 6)  Mucinex Dm Maximum Strength 60-1200 Mg Xr12h-tab (Dextromethorphan-guaifenesin) .... Take 1 tablet by mouth every morning  Patient Instructions: 1)  Your blood pressure is great today!  Continue taking benicar as directed. 2)  Please schedule a follow-up appointment in 3 months. Prescriptions: BENICAR 40 MG  TABS (OLMESARTAN MEDOXOMIL) One tablet by mouth daily  #30 x 6   Entered and Authorized by:   Myrtis Ser MD   Signed by:   Myrtis Ser MD on 05/22/2009   Method used:   Electronically to        [facility] (retail)       [address]       [place], [place]  [address]       Ph: [phone]       Fax: [phone]   RxID:   [id number]    Prevention & Chronic Care Immunizations   Influenza vaccine: Fluvax Non-MCR  (02/05/2009)    Tetanus booster: Not documented    Pneumococcal vaccine: Historical  (01/26/2009)    H. zoster vaccine: Not documented  Colorectal  Screening   Hemoccult: Not documented    Colonoscopy: Not documented   Colonoscopy action/deferral: GI referral  (05/22/2009)  Other Screening   PSA: Not documented  Reports requested:   Last colonoscopy report requested.  Smoking status: quit  (05/22/2009)  Lipids   Total Cholesterol: 126  (02/05/2009)   LDL: 69  (02/05/2009)   LDL Direct: Not documented   HDL: 44  (02/05/2009)   Triglycerides: 65  (02/05/2009)  Hypertension   Last Blood Pressure: 137 / 78  (05/22/2009)   Serum creatinine: 1.56  (04/25/2009)   Serum potassium 4.9  (04/25/2009)  Hypertension flowsheet reviewed?: Yes   Progress toward BP goal: Unchanged  Self-Management Support :   Personal Goals (by the next clinic visit) :      Personal blood pressure goal: 130/80  (02/05/2009)   Patient will work on the following items until the next clinic visit to reach self-care goals:     Medications and monitoring: take my medicines every day, bring all of my medications to every visit  (05/22/2009)     Eating: eat more vegetables, use fresh or frozen vegetables, eat fruit for snacks and desserts, limit or avoid alcohol  (05/22/2009)     Activity: take a 30 minute walk every day, park at the far end of the parking lot  (05/22/2009)    Hypertension self-management support: Written self-care plan  (02/19/2009)   Nursing Instructions: GI referral for screening colonoscopy (see order) Request report of last colonoscopy

## 2010-09-10 NOTE — Letter (Signed)
March 31, 2008    Lajean Saver, MD  [facility]  [address] [place],  [address]   RE:  DAIMIAN, BOOTH  MRN:  [id number]  /  DOB:  02-13-1941   Dear Dr. Hart Carwin:   It was a pleasure seeing Mr. Donate today in regards to his PVCs.  I  had the pleasure of seeing him 8 or 9 years ago for PVCs.  He is unaware  of palpitations.  He does have an occasional flutter.  He has no  complaints of chest pain or exercise intolerance.  He has had no syncope  or presyncope.   I should note that his history though is perhaps somewhat unreliable.  Every time when I asked him the question about the duration of anything,  the answer was 30 minutes.   His evaluation in the past included an echo done by [place] 8 years ago, that  was normal.   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   The other issue of which I do not really have an explanation is his  description of transient loss of vision.  He states he was standing  at  the dishwasher at work, he suddenly lost his vision and then it came  back.  We tried a variety of ways of trying to understand the duration  of this and I failed despite multiple attempts.  I was able to assure  myself that he stood standing for the duration of the episode and that  he did not feel the need to sit down with it.   PAST MEDICAL HISTORY:  Quite legion and as noted on your information  includes:  1. Pulmonary nodules.  2. Paraprotein monoclonal.  3. Idiopathic tremor.  4. Anemia.  5. Glaucoma.  6. Mental retardation.  7. Renal insufficiency.  8. Hypertension.   MEDICATIONS:  Currently include Ventolin, lisinopril, Travatan,  dorzolamide, and hydrochlorothiazide.   ALLERGIES:  He has no known drug allergies.   SOCIAL HISTORY:  He lives with his cousin.  He is widowed.  He has no  children, and he works in the [employer] at [employer].   PHYSICAL EXAMINATION:   GENERAL:  He is an elderly African American  gentleman who appears in his stated age of 75.  VITAL SIGNS:  His blood pressure today was 126/60 and his pulse was 46  and regular.  HEENT:  No icterus or xanthoma.  NECK:  Veins were flat.  His carotids were brisk and full bilaterally  without bruits.  BACK:  Without kyphosis or scoliosis.  LUNGS:  Clear.  HEART:  Sounds were regular with 2/6 murmur in the S4.  ABDOMEN:  Soft.  EXTREMITIES:  Femoral pulses were 2+.  Distal pulses were intact.  There  is no clubbing, cyanosis, or edema.  NEUROLOGIC:  Grossly normal.  SKIN:  Warm and dry.   IMPRESSION:  1. Premature ventricular contractions - largely asymptomatic.  2. Transient visual loss.  3. Hypertension.  4. History of glaucoma.   Dr. Hart Carwin, Mr. Mcadory's PVCs appear to be originated from the right  ventricular outflow tract in the absence of symptoms, I would not pursue  any therapy for them.  They may result in an artifactually lowered pulse  as the premature beats may not be counted and that may account for some  of his bradycardia.   As relates to his visual disturbance, I do not  think that it is related  to his ventricular ectopy.  Dr. Pauline Aus may have a better explanation as  to its cause.   If there is anything further I can do, please do not hesitate to contact  me.    Sincerely,      Deboraha Sprang, MD, [facility]  Electronically Signed    SCK/MedQ  DD: 03/31/2008  DT: 04/01/2008  Job #: [id number]   CC:    Herbie Baltimore L. Katy Fitch, M.D.

## 2010-10-03 ENCOUNTER — Other Ambulatory Visit: Payer: Self-pay | Admitting: *Deleted

## 2010-10-03 DIAGNOSIS — I10 Essential (primary) hypertension: Secondary | ICD-10-CM

## 2010-10-03 MED ORDER — OLMESARTAN MEDOXOMIL 40 MG PO TABS: 40.0000 mg | ORAL_TABLET | Freq: Every day | ORAL | Status: DC

## 2010-10-10 ENCOUNTER — Telehealth: Payer: Self-pay | Admitting: *Deleted

## 2010-10-10 NOTE — Telephone Encounter (Signed)
Jared Woodward from [facility] calls to say they did not receive benicar script electronically, given verbally.

## 2011-02-04 ENCOUNTER — Encounter: Payer: Self-pay | Admitting: Internal Medicine

## 2011-02-04 ENCOUNTER — Ambulatory Visit (HOSPITAL_COMMUNITY)
Admission: RE | Admit: 2011-02-04 | Discharge: 2011-02-04 | Disposition: A | Discharge status: Home / Self Care | Payer: 59 | Source: Ambulatory Visit | Attending: Internal Medicine | Admitting: Internal Medicine

## 2011-02-04 ENCOUNTER — Other Ambulatory Visit: Payer: Self-pay | Admitting: Internal Medicine

## 2011-02-04 ENCOUNTER — Ambulatory Visit (INDEPENDENT_AMBULATORY_CARE_PROVIDER_SITE_OTHER): Payer: 59 | Admitting: Internal Medicine

## 2011-02-04 VITALS — BP 148/77 | HR 63 | Temp 97.3°F | Ht 69.0 in | Wt 126.5 lb

## 2011-02-04 DIAGNOSIS — D518 Other vitamin B12 deficiency anemias: Secondary | ICD-10-CM

## 2011-02-04 DIAGNOSIS — D472 Monoclonal gammopathy: Secondary | ICD-10-CM

## 2011-02-04 DIAGNOSIS — J8289 Other pulmonary eosinophilia, not elsewhere classified: Secondary | ICD-10-CM

## 2011-02-04 DIAGNOSIS — I10 Essential (primary) hypertension: Secondary | ICD-10-CM

## 2011-02-04 DIAGNOSIS — Z299 Encounter for prophylactic measures, unspecified: Secondary | ICD-10-CM

## 2011-02-04 DIAGNOSIS — N259 Disorder resulting from impaired renal tubular function, unspecified: Secondary | ICD-10-CM

## 2011-02-04 DIAGNOSIS — J984 Other disorders of lung: Secondary | ICD-10-CM

## 2011-02-04 DIAGNOSIS — H409 Unspecified glaucoma: Secondary | ICD-10-CM

## 2011-02-04 DIAGNOSIS — R911 Solitary pulmonary nodule: Secondary | ICD-10-CM | POA: Insufficient documentation

## 2011-02-04 DIAGNOSIS — Z23 Encounter for immunization: Secondary | ICD-10-CM

## 2011-02-04 DIAGNOSIS — D638 Anemia in other chronic diseases classified elsewhere: Secondary | ICD-10-CM

## 2011-02-04 NOTE — Progress Notes (Deleted)
  Subjective:    Patient ID: Jared Woodward, male    DOB: 18-Jan-1941, 70 y.o.   MRN: [id number]  HPI    Review of Systems     Objective:   Physical Exam        Assessment & Plan:

## 2011-02-04 NOTE — Progress Notes (Signed)
Subjective:   Patient ID: Jared Woodward male   DOB: [birth year] 70 y.o.   MRN: [id number]  HPI: [name] is a 70 y.o. male with PMH significant as outlined below who presented to the clinic for a regular office visit. He was not seen since 03/2009 in the clinic. This my first encounter with the pleasant gentleman. Patient noted he has been doing fine. He denies any headache, chest pain, SOB, abdominal pain , diarrhea or constipation. He has no recent hospitalization or ED visits. He lives with Jared Woodward who was present during this office visit. The patient works at [employer] at [employer]. Patient was a former smoker, quit years ago.    Past Medical History  Diagnosis Date  . 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 ANEMIA OF CHRONIC DISEASE 05/08/2006  . ANEMIA, VITAMIN B12 DEFICIENCY NEC 01/12/2007  . GLAUCOMA 05/08/2006  . AMAUROSIS FUGAX 12/24/2007  . HYPERTENSION 05/08/2006  . PULMONARY NODULE 10/15/2007  . RENAL INSUFFICIENCY 05/08/2006   Current Outpatient Prescriptions  Medication Sig Dispense Refill  . brimonidine (ALPHAGAN) 0.15 % ophthalmic solution 1 drop 3 (three) times daily.        . dorzolamide (TRUSOPT) 2 % ophthalmic solution Place 1 drop into both eyes 2 (two) times daily.        Marland Kitchen latanoprost (XALATAN) 0.005 % ophthalmic solution 1 drop at bedtime.        Marland Kitchen olmesartan (BENICAR) 40 MG tablet Take 1 tablet (40 mg total) by mouth daily.  90 tablet  2   Review of Systems: Constitutional: Denies fever, chills, diaphoresis, appetite change and fatigue.  HEENT: Denies photophobia, eye pain, redness, hearing loss, ear pain, congestion, sore throat, rhinorrhea, sneezing, mouth sores, trouble swallowing, neck pain, neck stiffness and tinnitus.   Respiratory: Denies SOB, DOE, cough, chest tightness,  and wheezing.   Cardiovascular: Denies chest pain, palpitations and leg swelling.  Gastrointestinal: Denies nausea, vomiting, abdominal pain, diarrhea, constipation,  blood in stool and abdominal distention.  Genitourinary: Denies dysuria, urgency, frequency, hematuria, flank pain and difficulty urinating.  Musculoskeletal: Denies myalgias, back pain, joint swelling, arthralgias and gait problem.  Skin: Denies pallor, rash and wound.  Neurological: Denies dizziness, seizures, syncope, weakness, light-headedness, numbness and headaches.  Hematological: Denies adenopathy. Easy bruising, personal or family bleeding history  Psychiatric/Behavioral: Denies suicidal ideation, mood changes, confusion, nervousness, sleep disturbance and agitation  Objective:  Physical Exam: Filed Vitals:   02/04/11 0932 02/04/11 1020  BP: 161/87 148/77  Pulse: 61 63  Temp: 97.3 F (36.3 C)   TempSrc: Oral   Height: 5\' 9"  (1.753 m)   Weight: 126 lb 8 oz (57.38 kg)    Constitutional: Vital signs reviewed.  Patient is a well-developed and well-nourished  in no acute distress and cooperative with exam. Alert and oriented x3.  Head: Normocephalic and atraumatic Ear: TM normal bilaterally Mouth: no erythema or exudates, MMM Eyes: PERRL, EOMI, conjunctivae normal, No scleral icterus.  Neck: Supple, Trachea midline normal ROM, No JVD,  Cardiovascular: RRR, S1 normal, S2 normal, no MRG, pulses symmetric and intact bilaterally Pulmonary/Chest: CTAB, no wheezes, rales, or rhonchi Abdominal: Soft. Non-tender, non-distended, bowel sounds are normal, GU: no CVA tenderness Musculoskeletal: No joint deformities, erythema, or stiffness, ROM full and no nontender Neurological: A&O x3, Strenght is normal and symmetric bilaterally, no focal motor deficit, sensory intact to light touch bilaterally.  Skin: Warm, dry and intact. No rash, cyanosis, or clubbing.  Psychiatric: Normal mood and affect. speech and behavior is  normal.

## 2011-02-04 NOTE — Assessment & Plan Note (Signed)
This was noted as off 09/2007. Patient was referred to Oncology and was seen in 10/2007. We have no reports currently from the office visit. Will obtain records. Furthermore will recheck urine and plasma phoreses today . Patient may need again referral to Oncology.

## 2011-02-04 NOTE — Assessment & Plan Note (Signed)
Patient was noted to have pulmonary nodules and large cavitary left upper lobe lesion since 2009 and followed up by Dr Melvyn Novas ( [place]) and repeated CT and CXRay the last one in 03/2011 which was stable. He will repeat CXRay today and will discuss with radiology if patient need further evaluation. Patient may need refer to the Dr Melvyn Novas again.

## 2011-02-04 NOTE — Assessment & Plan Note (Addendum)
Will check renal function today.   Update: Creatinine function 1.8 (10/9)

## 2011-02-04 NOTE — Assessment & Plan Note (Signed)
Blood pressure elevated. Currently on Benicar. Will check blood work and have the patient come back in 2 weeks for possible changes in management.

## 2011-02-04 NOTE — Progress Notes (Deleted)
  Subjective:    Patient ID: Jared Woodward, male    DOB: 23-Jun-1940, 70 y.o.   MRN: [id number]  HPI    Review of Systems     Objective:   Physical Exam        Assessment & Plan:

## 2011-02-04 NOTE — Progress Notes (Deleted)
Subjective:   Patient ID: Jared Woodward male   DOB: 08/26/1940 70 y.o.   MRN: [id number]  HPI: Jared Woodward is a 70 y.o.  Cousine Jared Woodward lives with her  Dr Clent Jacks   Works at [employer] at [employer]   No past medical history on file. Current Outpatient Prescriptions  Medication Sig Dispense Refill  . brimonidine (ALPHAGAN) 0.15 % ophthalmic solution 1 drop 3 (three) times daily.        . dorzolamide (TRUSOPT) 2 % ophthalmic solution Place 1 drop into both eyes 2 (two) times daily.        Marland Kitchen latanoprost (XALATAN) 0.005 % ophthalmic solution 1 drop at bedtime.        Marland Kitchen olmesartan (BENICAR) 40 MG tablet Take 1 tablet (40 mg total) by mouth daily.  90 tablet  2   No family history on file. History   Social History  . Marital Status: Widowed    Spouse Name: N/A    Number of Children: N/A  . Years of Education: N/A   Social History Main Topics  . Smoking status: Former Research scientist (life sciences)  . Smokeless tobacco: Not on file  . Alcohol Use: Not on file  . Drug Use: Not on file  . Sexually Active: Not on file   Other Topics Concern  . Not on file   Social History Narrative  . No narrative on file   Review of Systems: Constitutional: Denies fever, chills, diaphoresis, appetite change and fatigue.  HEENT: Denies photophobia, eye pain, redness, hearing loss, ear pain, congestion, sore throat, rhinorrhea, sneezing, mouth sores, trouble swallowing, neck pain, neck stiffness and tinnitus.   Respiratory: Denies SOB, DOE, cough, chest tightness,  and wheezing.   Cardiovascular: Denies chest pain, palpitations and leg swelling.  Gastrointestinal: Denies nausea, vomiting, abdominal pain, diarrhea, constipation, blood in stool and abdominal distention.  Genitourinary: Denies dysuria, urgency, frequency, hematuria, flank pain and difficulty urinating.  Musculoskeletal: Denies myalgias, back pain, joint swelling, arthralgias and gait problem.  Skin: Denies pallor, rash and wound.    Neurological: Denies dizziness, seizures, syncope, weakness, light-headedness, numbness and headaches.  Hematological: Denies adenopathy. Easy bruising, personal or family bleeding history  Psychiatric/Behavioral: Denies suicidal ideation, mood changes, confusion, nervousness, sleep disturbance and agitation  Objective:  Physical Exam: Filed Vitals:   02/04/11 0932  BP: 161/87  Pulse: 61  Temp: 97.3 F (36.3 C)  TempSrc: Oral  Height: 5\' 9"  (1.753 m)  Weight: 126 lb 8 oz (57.38 kg)   Constitutional: Vital signs reviewed.  Patient is a well-developed and well-nourished *** in no acute distress and cooperative with exam. Alert and oriented x3.  Head: Normocephalic and atraumatic Ear: TM normal bilaterally Mouth: no erythema or exudates, MMM Eyes: PERRL, EOMI, conjunctivae normal, No scleral icterus.  Neck: Supple, Trachea midline normal ROM, No JVD, mass, thyromegaly, or carotid bruit present.  Cardiovascular: RRR, S1 normal, S2 normal, no MRG, pulses symmetric and intact bilaterally Pulmonary/Chest: CTAB, no wheezes, rales, or rhonchi Abdominal: Soft. Non-tender, non-distended, bowel sounds are normal, no masses, organomegaly, or guarding present.  GU: no CVA tenderness Musculoskeletal: No joint deformities, erythema, or stiffness, ROM full and no nontender Hematology: no cervical, inginal, or axillary adenopathy.  Neurological: A&O x3, Strenght is normal and symmetric bilaterally, cranial nerve II-XII are grossly intact, no focal motor deficit, sensory intact to light touch bilaterally.  Skin: Warm, dry and intact. No rash, cyanosis, or clubbing.  Psychiatric: Normal mood and affect. speech and behavior is normal.  Judgment and thought content normal. Cognition and memory are normal.   Assessment & Plan:

## 2011-02-04 NOTE — Assessment & Plan Note (Signed)
Has not been followed up since 3 years. Will recheck CBC with diff and check Vitamin B12 and Ferritin today and initiate therapy accordingly. Patient had colonoscopy performed in 2004 by Dr Fuller Plan and need follow up in 08/2012.

## 2011-02-04 NOTE — Assessment & Plan Note (Signed)
Vaccination: 02/04/2011 Flu, Tdap Colonoscopy: 2004 > repeat in 2014 per Dr Fuller Plan

## 2011-02-05 ENCOUNTER — Encounter: Payer: Self-pay | Admitting: Internal Medicine

## 2011-02-05 LAB — LIPID PANEL
HDL: 52 mg/dL (ref >39)
LDL Cholesterol: 88 mg/dL (ref 0–99)
Triglycerides: 60 mg/dL (ref <150)
VLDL: 12 mg/dL (ref 0–40)

## 2011-02-05 LAB — COMPREHENSIVE METABOLIC PANEL
ALT: 11 U/L (ref 0–53)
Albumin: 4.1 g/dL (ref 3.5–5.2)
CO2: 28 mEq/L (ref 19–32)
Calcium: 9.3 mg/dL (ref 8.4–10.5)
Chloride: 107 mEq/L (ref 96–112)
Glucose, Bld: 90 mg/dL (ref 70–99)
Sodium: 142 mEq/L (ref 135–145)
Total Bilirubin: 0.5 mg/dL (ref 0.3–1.2)
Total Protein: 6.6 g/dL (ref 6.0–8.3)

## 2011-02-05 LAB — CBC WITH DIFFERENTIAL/PLATELET
Basophils Absolute: 0 10*3/uL (ref 0.0–0.1)
Basophils Relative: 0 % (ref 0–1)
MCHC: 31.4 g/dL (ref 30.0–36.0)
Neutro Abs: 1.9 10*3/uL (ref 1.7–7.7)
Neutrophils Relative %: 53 % (ref 43–77)
RDW: 14.1 % (ref 11.5–15.5)

## 2011-02-06 LAB — UIFE/LIGHT CHAINS/TP QN, 24-HR UR
Free Kappa/Lambda Ratio: 6.78 ratio (ref 2.04–10.37)
Free Lambda Lt Chains,Ur: 0.51 mg/dL (ref 0.02–0.67)
Total Protein, Urine: 4.5 mg/dL

## 2011-02-06 LAB — SPEP & IFE WITH QIG
Beta 2: 11.5 % — ABNORMAL HIGH (ref 3.2–6.5)
Beta Globulin: 4.8 % (ref 4.7–7.2)
IgA: 511 mg/dL — ABNORMAL HIGH (ref 68–379)
IgG (Immunoglobin G), Serum: 887 mg/dL (ref 650–1600)
IgM, Serum: 32 mg/dL — ABNORMAL LOW (ref 41–251)
M-Spike, %: 0.3 g/dL
Total Protein, Serum Electrophoresis: 6.6 g/dL (ref 6.0–8.3)

## 2011-02-10 ENCOUNTER — Other Ambulatory Visit: Payer: Self-pay | Admitting: Internal Medicine

## 2011-02-10 ENCOUNTER — Telehealth: Payer: Self-pay | Admitting: Internal Medicine

## 2011-02-10 DIAGNOSIS — D518 Other vitamin B12 deficiency anemias: Secondary | ICD-10-CM

## 2011-02-10 DIAGNOSIS — D472 Monoclonal gammopathy: Secondary | ICD-10-CM

## 2011-02-10 MED ORDER — VITAMIN B-12 1000 MCG PO TABS: ORAL_TABLET | ORAL | Status: DC

## 2011-02-10 MED ORDER — COBALAMIN COMBINATIONS PO TABS: ORAL_TABLET | ORAL | Status: DC

## 2011-02-10 NOTE — Assessment & Plan Note (Signed)
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 Will refer patient to Oncology for further evaluation and management.

## 2011-02-10 NOTE — Telephone Encounter (Signed)
Informed the sister Raeford Razor about the low Vitamin B12. Patient would prefer to take tablets instead of injection. Send prescription to pharmacy. Further informed the patient about paraproteinemia and that patient would be referred to Oncology.

## 2011-02-18 ENCOUNTER — Encounter: Payer: Self-pay | Admitting: Internal Medicine

## 2011-02-18 ENCOUNTER — Ambulatory Visit (INDEPENDENT_AMBULATORY_CARE_PROVIDER_SITE_OTHER): Payer: 59 | Admitting: Internal Medicine

## 2011-02-18 VITALS — BP 151/81 | HR 61 | Temp 97.3°F | Ht 69.0 in | Wt 124.1 lb

## 2011-02-18 DIAGNOSIS — I10 Essential (primary) hypertension: Secondary | ICD-10-CM

## 2011-02-18 DIAGNOSIS — D472 Monoclonal gammopathy: Secondary | ICD-10-CM

## 2011-02-18 DIAGNOSIS — J984 Other disorders of lung: Secondary | ICD-10-CM

## 2011-02-18 MED ORDER — OLMESARTAN MEDOXOMIL 40 MG PO TABS: 40.0000 mg | ORAL_TABLET | Freq: Every day | ORAL | Status: DC

## 2011-02-18 NOTE — Assessment & Plan Note (Addendum)
Patient has been only taking Benicar 20 mg instead of the 40 mg. Blood pressure slightly elevated. Recommended to increase to 40 mg daily. Check Blood pressure on a regular basis and bring in the recording at the next office visit.

## 2011-02-18 NOTE — Progress Notes (Deleted)
  Subjective:    Patient ID: Jared Woodward, male    DOB: Aug 28, 1940, 70 y.o.   MRN: [id number]  HPI    Review of Systems     Objective:   Physical Exam        Assessment & Plan:

## 2011-02-18 NOTE — Assessment & Plan Note (Signed)
Will refer the patient for Oncology for further evaluation and mangement

## 2011-02-18 NOTE — Progress Notes (Signed)
  Subjective:   Patient ID: Jared Woodward male   DOB: Aug 30, 1940 70 y.o.   MRN: [id number]  HPI: [name] is a 70 y.o. male with PMH significant as outlined below who presented to the clinic for a regular follow up of work. He noted that he has been  doing well . No complains. He started his Vitamin B12 as recommended. He does not want to take any shots.      Past Medical History  Diagnosis Date  . 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 ANEMIA OF CHRONIC DISEASE 05/08/2006  . ANEMIA, VITAMIN B12 DEFICIENCY NEC 01/12/2007  . GLAUCOMA 05/08/2006  . AMAUROSIS FUGAX 12/24/2007  . HYPERTENSION 05/08/2006  . PULMONARY NODULE 10/15/2007  . RENAL INSUFFICIENCY 05/08/2006   Current Outpatient Prescriptions  Medication Sig Dispense Refill  . brimonidine (ALPHAGAN) 0.15 % ophthalmic solution 1 drop 3 (three) times daily.        . dorzolamide (TRUSOPT) 2 % ophthalmic solution Place 1 drop into both eyes 2 (two) times daily.        Marland Kitchen latanoprost (XALATAN) 0.005 % ophthalmic solution 1 drop at bedtime.        Marland Kitchen olmesartan (BENICAR) 40 MG tablet Take 1 tablet (40 mg total) by mouth daily.  90 tablet  2  . vitamin B-12 (CYANOCOBALAMIN) 1000 MCG tablet Take 2 tablet daily per mouth.  60 tablet  3   Review of Systems: Constitutional: Denies fever, chills, diaphoresis, appetite change and fatigue.  Respiratory: Denies SOB, DOE, cough, chest tightness,  and wheezing.   Cardiovascular: Denies chest pain, palpitations and leg swelling.  Gastrointestinal: Denies nausea, vomiting, abdominal pain, diarrhea, constipation, blood in stool and abdominal distention.  Genitourinary: Denies dysuria, urgency, frequency, hematuria, flank pain and difficulty urinating.  Musculoskeletal: Denies myalgias, back pain, joint swelling, arthralgias and gait problem.  Neurological: Denies dizziness, seizures, syncope, weakness, light-headedness, numbness and headaches.    Objective:  Physical Exam: Filed  Vitals:   02/18/11 0928  BP: 151/81  Pulse: 61  Temp: 97.3 F (36.3 C)  TempSrc: Oral  Height: 5\' 9"  (1.753 m)  Weight: 124 lb 1.6 oz (56.291 kg)   Constitutional: Vital signs reviewed.  Patient is a well-developed and well-nourished  in no acute distress and cooperative with exam. Alert and oriented x3.  Cardiovascular: RRR, S1 normal, S2 normal, no MRG, pulses symmetric and intact bilaterally Pulmonary/Chest: CTAB, no wheezes, rales, or rhonchi Abdominal: Soft. Non-tender, non-distended, bowel sounds are normal, no masses, organomegaly, or guarding present.  Musculoskeletal: No joint deformities, erythema, or stiffness, ROM full and no nontender Neurological: A&O x3, Strenght is normal and symmetric bilaterally,  no focal motor deficit, sensory intact to light touch bilaterally.

## 2011-02-18 NOTE — Assessment & Plan Note (Signed)
Repeat chest XRay from 02/04/2011 showed stable lesion. Discussed with Radiology who noted no significant changes continue to monitor on a regular basis. Discussed with Dr Melvyn Novas who recommended repeat CXRay in one year.

## 2011-02-27 ENCOUNTER — Other Ambulatory Visit: Payer: Self-pay | Admitting: Oncology

## 2011-02-27 ENCOUNTER — Ambulatory Visit (HOSPITAL_COMMUNITY)
Admission: RE | Admit: 2011-02-27 | Discharge: 2011-02-27 | Disposition: A | Discharge status: Home / Self Care | Payer: 59 | Source: Ambulatory Visit | Attending: Oncology | Admitting: Oncology

## 2011-02-27 ENCOUNTER — Encounter (HOSPITAL_BASED_OUTPATIENT_CLINIC_OR_DEPARTMENT_OTHER): Payer: 59 | Admitting: Oncology

## 2011-02-27 DIAGNOSIS — N189 Chronic kidney disease, unspecified: Secondary | ICD-10-CM

## 2011-02-27 DIAGNOSIS — I10 Essential (primary) hypertension: Secondary | ICD-10-CM

## 2011-02-27 DIAGNOSIS — Q762 Congenital spondylolisthesis: Secondary | ICD-10-CM | POA: Insufficient documentation

## 2011-02-27 DIAGNOSIS — D649 Anemia, unspecified: Secondary | ICD-10-CM

## 2011-02-27 DIAGNOSIS — M949 Disorder of cartilage, unspecified: Secondary | ICD-10-CM | POA: Insufficient documentation

## 2011-02-27 DIAGNOSIS — D472 Monoclonal gammopathy: Secondary | ICD-10-CM

## 2011-02-27 DIAGNOSIS — M899 Disorder of bone, unspecified: Secondary | ICD-10-CM | POA: Insufficient documentation

## 2011-02-27 LAB — CBC WITH DIFFERENTIAL/PLATELET
Basophils Absolute: 0 10*3/uL (ref 0.0–0.1)
EOS%: 2.3 % (ref 0.0–7.0)
Eosinophils Absolute: 0.1 10*3/uL (ref 0.0–0.5)
HCT: 31 % — ABNORMAL LOW (ref 38.4–49.9)
HGB: 10.6 g/dL — ABNORMAL LOW (ref 13.0–17.1)
MCH: 32.9 pg (ref 27.2–33.4)
MCV: 96.1 fL (ref 79.3–98.0)
MONO%: 5.8 % (ref 0.0–14.0)
NEUT#: 1.5 10*3/uL (ref 1.5–6.5)
NEUT%: 42.7 % (ref 39.0–75.0)

## 2011-02-27 NOTE — Progress Notes (Signed)
Addended by: Hulan Fray on: 02/27/2011 09:54 AM   Modules accepted: Orders

## 2011-03-03 LAB — COMPREHENSIVE METABOLIC PANEL
AST: 15 U/L (ref 0–37)
Albumin: 3.9 g/dL (ref 3.5–5.2)
BUN: 30 mg/dL — ABNORMAL HIGH (ref 6–23)
Calcium: 9.2 mg/dL (ref 8.4–10.5)
Chloride: 109 mEq/L (ref 96–112)
Creatinine, Ser: 1.92 mg/dL — ABNORMAL HIGH (ref 0.50–1.35)
Glucose, Bld: 97 mg/dL (ref 70–99)
Potassium: 4.2 mEq/L (ref 3.5–5.3)

## 2011-03-03 LAB — SPEP & IFE WITH QIG
Albumin ELP: 58.9 % (ref 55.8–66.1)
Alpha-1-Globulin: 4 % (ref 2.9–4.9)
Alpha-2-Globulin: 9.4 % (ref 7.1–11.8)
Beta 2: 11.2 % — ABNORMAL HIGH (ref 3.2–6.5)
Beta Globulin: 4.8 % (ref 4.7–7.2)
Gamma Globulin: 11.7 % (ref 11.1–18.8)
IgM, Serum: 26 mg/dL — ABNORMAL LOW (ref 41–251)

## 2011-03-03 LAB — KAPPA/LAMBDA LIGHT CHAINS
Kappa:Lambda Ratio: 0.96 (ref 0.26–1.65)
Lambda Free Lght Chn: 2.12 mg/dL (ref 0.57–2.63)

## 2011-03-03 NOTE — Progress Notes (Signed)
CC:   [name], Dr. 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  At that time had presented with an IgA kappa subtype.  HISTORY OF PRESENT ILLNESS:  Jared Woodward presents today for a followup visit.  I had initially evaluated him back in July 2009.  This gentleman has a longstanding history of hypertension and chronic renal insufficiency related to that.  He also was found to have a monoclonal protein, indicating possible plasma cell disorder.  At that time, his workup was really unrevealing for anything to suggest a plasma cell disorder.  A skeletal survey was unremarkable and was done in June 2009, which showed a 5.6 mm lucent area which was probably a venous lake rather than any lytic bony lesion.  His protein studies in 2009 showed that he had an M spike of 0.29 g/dL, slightly elevated IgA subtype at 582, he had normal kappa and lambda free light chain, and immunofixation showed an IgA lambda.  At that time, I recommended active surveillance, but Jared Woodward has not shown up in followup.  He was referred again by his primary care physician for reevaluation.  Clinically, Jared Woodward is asymptomatic.  He had not reported any back pain, had not reported any shoulder pain.  Had not had any pathological fractures.  He did have a chest x-ray in October 2012 to follow up on a pulmonary nodule that was really stable for the time being.  REVIEW OF SYSTEMS:  He does not report any headaches, blurry vision, double vision.  Does not report any motor or sensory neuropathy.  Does not report any alteration in mental status.  Does not report any psychiatric issues, depression.  Does not report any fever, chills, sweats.  Does not report any cough, hemoptysis, hematemesis.  No nausea or vomiting.  No abdominal pain.  No hematochezia, no melena.   No genitourinary complaints.  The rest of the review of systems is unremarkable.  PHYSICAL EXAMINATION:  General:  Alert, awake gentleman, appeared in no active distress.  Vital Signs:  Blood pressure is 179/96, pulse 64, respiration 18, weight is 127.3 pounds.  HEENT:  Head is normocephalic, atraumatic.  Pupils equal, round and reactive to light.  Oral mucosa moist and pink.  Neck:  Supple, no lymphadenopathy.  Heart:  Regular rate and rhythm, S1 and S2.  Lungs:  Clear to auscultation, no rhonchi, wheezes or dullness to percussion.  Abdomen:  Soft, nontender, no hepatosplenomegaly.  Extremities:  No edema.  LABORATORY DATA:  Today showed a hemoglobin of 10.6, white cell count of 3.5, platelet count of 191.  His chemistries on February 04, 2011, showed creatinine of 1.82, which is really at baseline, fluctuated from as high as 1.9 two years ago.  His total protein is unchanged at 6.6, which is normal.  Albumin is normal at 4.1, calcium of 9.3.  His repeat urine electrophoresis continued to show evidence of an IgA lambda monoclonal protein.  His free lambda in the urine could not be calculated, with a kappa to lambda ratio of 6.78.  ASSESSMENT AND PLAN:  This is a pleasant 70 year old gentleman with the following issues: 1. IgA lambda monoclonal protein, most likely represents a monoclonal     gammopathy of undetermined significance, rule out plasma cell     disorder or multiple myeloma.  Again, he was initially evaluated     about 3 years ago, and  these parameters have not really changed     over 3 years, which make it less likely a multiple myeloma and make     it more likely to be a plasma cell disorder in the form of     monoclonal gammopathy of undetermined significance, which I think     is what is going on at this time.  However, given the fact that an     IgA subtype is at high risk of progressing into multiple myeloma,     we will continue to monitor him closely from that  standpoint.  I     would also like to repeat his skeletal survey, and if that indeed     is negative, then we will repeat his tumor markers and protein     studies in about 6 months, and certainly, if he has shown any lytic     bony lesions, then we will stage him with a bone marrow biopsy at     this time. 2. Renal insufficiency and mild anemia.  I think they are all related     to longstanding hypertension.  I do not think there is an element     of a plasma cell disorder at this time.    ______________________________ Jared Woodward, M.D. FNS/MEDQ  D:  02/27/2011  T:  02/28/2011  Job:  [id number]

## 2011-05-21 ENCOUNTER — Encounter: Payer: Self-pay | Admitting: Internal Medicine

## 2011-05-21 ENCOUNTER — Ambulatory Visit (INDEPENDENT_AMBULATORY_CARE_PROVIDER_SITE_OTHER): Payer: 59 | Admitting: Internal Medicine

## 2011-05-21 VITALS — BP 152/86 | HR 88 | Temp 98.8°F | Ht 69.0 in | Wt 126.8 lb

## 2011-05-21 DIAGNOSIS — R05 Cough: Secondary | ICD-10-CM

## 2011-05-21 DIAGNOSIS — I10 Essential (primary) hypertension: Secondary | ICD-10-CM

## 2011-05-21 DIAGNOSIS — N259 Disorder resulting from impaired renal tubular function, unspecified: Secondary | ICD-10-CM

## 2011-05-21 DIAGNOSIS — R059 Cough, unspecified: Secondary | ICD-10-CM

## 2011-05-21 DIAGNOSIS — N289 Disorder of kidney and ureter, unspecified: Secondary | ICD-10-CM

## 2011-05-21 MED ORDER — ALBUTEROL SULFATE HFA 108 (90 BASE) MCG/ACT IN AERS: 2.0000 | INHALATION_SPRAY | Freq: Four times a day (QID) | RESPIRATORY_TRACT | Status: DC | PRN

## 2011-05-21 MED ORDER — GUAIFENESIN 100 MG/5ML PO LIQD: 100.0000 mg | Freq: Two times a day (BID) | ORAL | Status: DC | PRN

## 2011-05-21 MED ORDER — DOXYCYCLINE HYCLATE 100 MG PO TABS: 100.0000 mg | ORAL_TABLET | Freq: Two times a day (BID) | ORAL | Status: AC

## 2011-05-21 NOTE — Progress Notes (Signed)
Subjective:   Patient ID: Jared Woodward male   DOB: 1940/06/16 71 y.o.   MRN: [id number]  HPI: [name] is a 71 y.o. male with past medications she's significant as outlined below who presented to the clinic with congestion and cough. Patient reports he has been experiencing initially congestion with nasal discharge since one week and since 3 days he has been coughing with occasionally yellowish phlegm. He further reports about shortness of breath and wheezing . Furthermore the patient complains about headaches for which he has been using Tylenol. Patient denies any fevers or chills, ear pain. Reports his cousin is sick but otherwise no recent travel.     Past Medical History  Diagnosis Date  . 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 ANEMIA OF CHRONIC DISEASE 05/08/2006  . ANEMIA, VITAMIN B12 DEFICIENCY NEC 01/12/2007  . GLAUCOMA 05/08/2006  . AMAUROSIS FUGAX 12/24/2007  . HYPERTENSION 05/08/2006  . PULMONARY NODULE 10/15/2007  . RENAL INSUFFICIENCY 05/08/2006   Current Outpatient Prescriptions  Medication Sig Dispense Refill  . brimonidine (ALPHAGAN) 0.15 % ophthalmic solution 1 drop 3 (three) times daily.        . dorzolamide (TRUSOPT) 2 % ophthalmic solution Place 1 drop into both eyes 2 (two) times daily.        Marland Kitchen latanoprost (XALATAN) 0.005 % ophthalmic solution 1 drop at bedtime.        Marland Kitchen olmesartan (BENICAR) 40 MG tablet Take 1 tablet (40 mg total) by mouth daily.  30 tablet  3  . vitamin B-12 (CYANOCOBALAMIN) 1000 MCG tablet Take 2 tablet daily per mouth.  60 tablet  3   No family history on file. History   Social History  . Marital Status: Widowed    Spouse Name: N/A    Number of Children: N/A  . Years of Education: N/A   Social History Main Topics  . Smoking status: Former Research scientist (life sciences)  . Smokeless tobacco: None  . Alcohol Use: None  . Drug Use: None  . Sexually Active: None   Other Topics Concern  . None   Social History Narrative   Patient lives Jared Woodward who seems to be the main Care Giver. The patient works at  [employer] at [employer]   Review of Systems: Constitutional: Denies fever, chills, diaphoresis, appetite change and fatigue.  HEENT: Denies hearing loss, ear pain, sore throat,  mouth sores, trouble swallowing, neck pain, neck stiffness and tinnitus but noted congestion, , rhinorrhea, sneezing, Respiratory: Noted SOB, DOE, cough, chest tightness,  and wheezing.   Cardiovascular: Denies chest pain, palpitations and leg swelling.  Gastrointestinal: Denies nausea, vomiting, abdominal pain, diarrhea, constipation, Genitourinary: Denies dysuria, urgency, frequency, hematuria, flank pain and difficulty urinating.  Skin: Denies pallor, rash and wound.  Neurological: Denies dizziness,   Objective:  Physical Exam: Filed Vitals:   05/21/11 0844  BP: 164/88  Pulse: 88  Temp: 98.8 F (37.1 C)  TempSrc: Oral  Height: 5\' 9"  (1.753 m)  Weight: 126 lb 12.8 oz (57.516 kg)  SpO2: 100%   Constitutional: Vital signs reviewed.  Patient is a well-developed and well-nourished  in no acute distress and cooperative with exam. Alert and oriented x3.  Head: Normocephalic and atraumatic Ear: TM normal bilaterally Mouth: no erythema or exudates, MMM Eyes: PERRL, EOMI, conjunctivae normal, No scleral icterus.  Neck: Supple,  Cardiovascular: RRR, S1 normal, S2 normal, no MRG, pulses symmetric and intact bilaterally Pulmonary/Chest:  Wheezes throughout, no rales, or rhonchi Abdominal: Soft. Non-tender, non-distended, bowel sounds  are normal,  Hematology: no cervicaladenopathy.  Neurological: A&O x3, Skin: Warm, dry and intact. No rash, cyanosis, or clubbing.

## 2011-05-21 NOTE — Assessment & Plan Note (Signed)
We'll recheck his renal function.

## 2011-05-21 NOTE — Patient Instructions (Signed)
You have prescribed an antibiotic therapy for 5 days Please take the cough syrup only as needed preferable only during the night. If his symptoms worsen new experiencing fevers and chills please call the clinic for further recommendation.

## 2011-05-21 NOTE — Assessment & Plan Note (Signed)
Blood pressure elevated during this office visit most likely due to acute bronchitis. Will continue current regimen and have the patient come back in 2 weeks  for reevaluation.  BP Readings from Last 3 Encounters:  05/21/11 152/86  02/18/11 151/81  02/04/11 148/77

## 2011-05-21 NOTE — Assessment & Plan Note (Addendum)
Likely acute bronchitis. Considering his age and history of prior proteinemia and pulmonary disease I will treat patient with antibiotic therapy and cough syrup for symptom relieve. Considering patient was wheezing I will provide patient with an albuterol inhaler.  Comment the patient to call the clinic if patient's symptoms worsen

## 2011-05-22 LAB — BASIC METABOLIC PANEL
BUN: 21 mg/dL (ref 6–23)
CO2: 30 mEq/L (ref 19–32)
Calcium: 9 mg/dL (ref 8.4–10.5)
Chloride: 104 mEq/L (ref 96–112)
Creat: 1.86 mg/dL — ABNORMAL HIGH (ref 0.50–1.35)
Glucose, Bld: 97 mg/dL (ref 70–99)

## 2011-06-04 ENCOUNTER — Ambulatory Visit (INDEPENDENT_AMBULATORY_CARE_PROVIDER_SITE_OTHER): Payer: 59 | Admitting: Internal Medicine

## 2011-06-04 ENCOUNTER — Encounter: Payer: Self-pay | Admitting: Internal Medicine

## 2011-06-04 DIAGNOSIS — I10 Essential (primary) hypertension: Secondary | ICD-10-CM

## 2011-06-04 DIAGNOSIS — R05 Cough: Secondary | ICD-10-CM

## 2011-06-04 MED ORDER — GUAIFENESIN 100 MG/5ML PO LIQD: 100.0000 mg | Freq: Two times a day (BID) | ORAL | Status: AC | PRN

## 2011-06-04 NOTE — Progress Notes (Signed)
Subjective:   Patient ID: Jared Woodward male   DOB: May 19, 1940 71 y.o.   MRN: [id number]  HPI: Jared Woodward is a 71 y.o. with a PMHx of low literacy, HTN, glaucoma who presented to clinic today for the following:  1) HTN - Patient does not check blood pressure regularly at home. Currently taking benicar daily without missing doses and without issues with the medication. denies headaches, dizziness, lightheadedness, chest pain, shortness of breath.  does not request refills today. He is currently taking robitussin DM for his cough and has not taken his benicar this morning.   2) URI - pt was recently seen at the end of January, at which time he was found to have an acute bronchitis. His cough is slowly improving, is not having fevers, chills, nausea, vomiting, diarrhea, shortness of breath, change in appetite.  Review of Systems: Per HPI.  Current Outpatient Medications: Medication Sig  . albuterol (PROVENTIL HFA) 108 (90 BASE) MCG/ACT inhaler Inhale 2 puffs into the lungs every 6 (six) hours as needed for wheezing.  . brimonidine (ALPHAGAN) 0.15 % ophthalmic solution 1 drop 3 (three) times daily.    . dorzolamide (TRUSOPT) 2 % ophthalmic solution Place 1 drop into both eyes 2 (two) times daily.    Marland Kitchen latanoprost (XALATAN) 0.005 % ophthalmic solution 1 drop at bedtime.    Marland Kitchen olmesartan (BENICAR) 40 MG tablet Take 1 tablet (40 mg total) by mouth daily.  . vitamin B-12 (CYANOCOBALAMIN) 1000 MCG tablet Take 2 tablet daily per mouth.    Allergies: No Known Allergies   Past Medical History  Diagnosis Date  . 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 ANEMIA OF CHRONIC DISEASE 05/08/2006  . ANEMIA, VITAMIN B12 DEFICIENCY NEC 01/12/2007  . GLAUCOMA 05/08/2006  . AMAUROSIS FUGAX 12/24/2007  . HYPERTENSION 05/08/2006  . PULMONARY NODULE 10/15/2007  . RENAL INSUFFICIENCY 05/08/2006    No past surgical history on file.   Objective:   Physical Exam: Filed Vitals:   06/04/11 0855  BP:  157/88  Pulse: 72  Temp: 97.9 F (36.6 C)      General: Vital signs reviewed and noted. Well-developed, well-nourished, in no acute distress; alert, appropriate and cooperative throughout examination.  Head: Normocephalic, atraumatic.  Lungs:  Normal respiratory effort. Clear to auscultation BL without crackles. Mild occasional expiratory wheezing.  Heart: RRR. S1 and S2 normal without gallop, rubs. (+) murmur  Abdomen:  BS normoactive. Soft, Nondistended, non-tender.  No masses or organomegaly.  Extremities: No pretibial edema.    Assessment & Plan:  Case and plan of care discussed with Dr. Milta Deiters.

## 2011-06-04 NOTE — Patient Instructions (Signed)
   Please follow-up at the clinic in 2-3 months with your PCP, at which time we will reevaluate your blood pressure, cough.  There have been changes in your medications  DO NOT take the robitussin that you have at home.  Start taking guaifenesin for cough as needed.  Please follow-up in the clinic sooner if needed.  If you have been started on new medication(s), and you develop symptoms concerning for allergic reaction, including, but not limited to, throat closing, tongue swelling, rash, please stop the medication immediately and call the clinic at [phone], and go to the ER.  If you are diabetic, please bring your meter to your next visit.  If symptoms worsen, or new symptoms arise, please call the clinic or go to the ER.  Please bring all of your medications in a bag to your next visit.

## 2011-06-04 NOTE — Assessment & Plan Note (Addendum)
Patient was diagnosed with an acute bronchitis during last visit visit on 05/21/2011, at which time he was prescribed with 5 day course of doxycycline and guaifenesin. He has completed the antibiotics and is actually taking Robitussin DM for the cough. Continues to have cough. Lung exam reveals mild occasional expiratory wheezes, otherwise not significant.   Recommend change to guaifenesin for cough, do not use DM products to avoid confounding evaluation of his blood pressure.  No additional antibiotics needed  Educated that cough may persist following the bronchitis for several weeks - get alarmed if developing fevers, significant shortness of breath, etc.

## 2011-06-04 NOTE — Assessment & Plan Note (Addendum)
BP Readings from Last 3 Encounters:  06/04/11 157/88  05/21/11 152/86  02/18/11 [id number]    Basic Metabolic Panel:    Component Value Date/Time   NA 140 05/21/2011 0911   K 5.0 05/21/2011 0911   CL 104 05/21/2011 0911   CO2 30 05/21/2011 0911   BUN 21 05/21/2011 0911   CREATININE 1.86* 05/21/2011 0911   CREATININE 1.92* 02/27/2011 0950   GLUCOSE 97 05/21/2011 0911   CALCIUM 9.0 05/21/2011 0911    Assessment: Hypertension Control: not controlled  Progress toward goals: unchanged  Barriers to meeting goals: has not taking his blood pressure medication this morning, and he is currently taking medications that can raise blood pressure (his cough syrup)   Plan:  Continue current medications - his elevation could be confounded by cough syrup usage.  Will encourage to avoid use of cough syrup with phenylephrine  Will reassess with PCP in 2-3 months. If continued elevation, will consider escalation of therapy. Can consider CCB as pt is african [place] (prior worsening renal function with HCTZ, cough with ACE-I, did not like Lasix)

## 2011-08-27 ENCOUNTER — Other Ambulatory Visit: Payer: 59 | Admitting: Lab

## 2011-08-27 ENCOUNTER — Ambulatory Visit: Payer: 59 | Admitting: Oncology

## 2011-11-24 ENCOUNTER — Other Ambulatory Visit: Payer: Self-pay | Admitting: *Deleted

## 2011-11-24 DIAGNOSIS — I10 Essential (primary) hypertension: Secondary | ICD-10-CM

## 2011-11-24 MED ORDER — OLMESARTAN MEDOXOMIL 40 MG PO TABS: 40.0000 mg | ORAL_TABLET | Freq: Every day | ORAL | Status: DC

## 2012-04-02 ENCOUNTER — Other Ambulatory Visit: Payer: Self-pay | Admitting: Internal Medicine

## 2012-04-02 DIAGNOSIS — I10 Essential (primary) hypertension: Secondary | ICD-10-CM

## 2012-04-06 ENCOUNTER — Ambulatory Visit (INDEPENDENT_AMBULATORY_CARE_PROVIDER_SITE_OTHER): Payer: Medicare Other | Admitting: Internal Medicine

## 2012-04-06 ENCOUNTER — Ambulatory Visit (HOSPITAL_COMMUNITY)
Admission: RE | Admit: 2012-04-06 | Discharge: 2012-04-06 | Disposition: A | Discharge status: Home / Self Care | Payer: Self-pay | Source: Ambulatory Visit | Attending: Internal Medicine | Admitting: Internal Medicine

## 2012-04-06 ENCOUNTER — Encounter: Payer: Self-pay | Admitting: Internal Medicine

## 2012-04-06 VITALS — BP 160/95 | HR 69 | Temp 97.8°F | Resp 20 | Ht 69.0 in | Wt 126.1 lb

## 2012-04-06 DIAGNOSIS — D638 Anemia in other chronic diseases classified elsewhere: Secondary | ICD-10-CM

## 2012-04-06 DIAGNOSIS — Z87891 Personal history of nicotine dependence: Secondary | ICD-10-CM | POA: Insufficient documentation

## 2012-04-06 DIAGNOSIS — R911 Solitary pulmonary nodule: Secondary | ICD-10-CM | POA: Insufficient documentation

## 2012-04-06 DIAGNOSIS — N259 Disorder resulting from impaired renal tubular function, unspecified: Secondary | ICD-10-CM

## 2012-04-06 DIAGNOSIS — R0989 Other specified symptoms and signs involving the circulatory and respiratory systems: Secondary | ICD-10-CM | POA: Insufficient documentation

## 2012-04-06 DIAGNOSIS — R05 Cough: Secondary | ICD-10-CM | POA: Insufficient documentation

## 2012-04-06 DIAGNOSIS — I10 Essential (primary) hypertension: Secondary | ICD-10-CM

## 2012-04-06 DIAGNOSIS — R059 Cough, unspecified: Secondary | ICD-10-CM | POA: Insufficient documentation

## 2012-04-06 DIAGNOSIS — J984 Other disorders of lung: Secondary | ICD-10-CM

## 2012-04-06 LAB — CBC
HCT: 35.7 % — ABNORMAL LOW (ref 39.0–52.0)
Hemoglobin: 11.9 g/dL — ABNORMAL LOW (ref 13.0–17.0)
MCHC: 33.3 g/dL (ref 30.0–36.0)
MCV: 92.5 fL (ref 78.0–100.0)
RDW: 13.7 % (ref 11.5–15.5)

## 2012-04-06 MED ORDER — AMLODIPINE BESYLATE 10 MG PO TABS: 10.0000 mg | ORAL_TABLET | Freq: Every day | ORAL | Status: DC

## 2012-04-06 NOTE — Assessment & Plan Note (Signed)
Blood pressure elevated again during this office visit most likely due to cough and she use of cough syrup. Furthermore patient noted that he had to pay $160 for Benicar and would preferred to change it to something else. He just refilled his medication on Saturday. I recommended to complete Benicar and start Norvasc 10 mg daily. I will have the patient come back to reevaluate his blood pressure during the next office visit. We may have to add other medication which unfortunately limited since patient has been experiencing cough with ACE inhibitor and hydrochlorothiazide has caused worsening renal function in the past.

## 2012-04-06 NOTE — Patient Instructions (Signed)
Take the new blood pressure medication : 1/2 tablet a day.

## 2012-04-06 NOTE — Progress Notes (Signed)
Subjective:   Patient ID: Jared Woodward male   DOB: 1940-05-27 71 y.o.   MRN: [id number]  HPI: [name] is a 71 y.o. male with past medical history significant as outlined below who presented to the clinic for regular office visit. Patient reports he has been doing fine except since 1 week he has been experiencing runny nose and productive cough with white count great color phlegm. Denies any fevers or chills. He tried Tussin chest congestion and Delsym without any significant improvement. His sister was present during this office visit who noted that his boyfriend had pneumonia and Mr. [name] was in contact with him.    Past Medical History  Diagnosis Date  . 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 ANEMIA OF CHRONIC DISEASE 05/08/2006  . ANEMIA, VITAMIN B12 DEFICIENCY NEC 01/12/2007  . GLAUCOMA 05/08/2006  . AMAUROSIS FUGAX 12/24/2007  . HYPERTENSION 05/08/2006  . PULMONARY NODULE 10/15/2007  . RENAL INSUFFICIENCY 05/08/2006  . MENTAL RETARDATION 05/08/2006   Current Outpatient Prescriptions  Medication Sig Dispense Refill  . albuterol (PROVENTIL HFA) 108 (90 BASE) MCG/ACT inhaler Inhale 2 puffs into the lungs every 6 (six) hours as needed for wheezing.  1 Inhaler  0  . BENICAR 40 MG tablet TAKE ONE TABLET BY MOUTH EVERY DAY  30 tablet  2  . brimonidine (ALPHAGAN) 0.15 % ophthalmic solution 1 drop 3 (three) times daily.        . dorzolamide (TRUSOPT) 2 % ophthalmic solution Place 1 drop into both eyes 2 (two) times daily.        Marland Kitchen latanoprost (XALATAN) 0.005 % ophthalmic solution 1 drop at bedtime.        . vitamin B-12 (CYANOCOBALAMIN) 1000 MCG tablet Take 2 tablet daily per mouth.  60 tablet  3   No family history on file. History   Social History  . Marital Status: Widowed    Spouse Name: N/A    Number of Children: 0  . Years of Education: 2nd grade   Occupational History  . KITCHEN [employer]   Social History Main Topics  . Smoking status: Former Smoker -- 0.1  packs/day for 5 years    Types: Cigarettes    Quit date: 05/03/1988  . Smokeless tobacco: None  . Alcohol Use: No  . Drug Use: No  . Sexually Active: None   Other Topics Concern  . None   Social History Narrative   Patient lives Mellissa Woodward who seems to be the main Care Giver. The patient works at  [employer] at [employer].Cannot read or write (except his name)   Review of Systems: Constitutional: Denies fever, chills, diaphoresis, appetite change and fatigue.  Respiratory: Noted SOB, DOE, cough, chest tightness,  and wheezing.   Cardiovascular: Denies chest pain, palpitations and leg swelling.  Gastrointestinal: Denies nausea, vomiting, abdominal pain, diarrhea, constipation, blood in stool and abdominal distention.  Genitourinary: Denies dysuria, urgency, frequency, hematuria, flank pain and difficulty urinating.  Skin: Denies pallor, rash and wound.  Neurological: Denies dizziness  and headaches.    Objective:  Physical Exam: Filed Vitals:   04/06/12 1606  BP: 160/95  Pulse: 69  Temp: 97.8 F (36.6 C)  TempSrc: Oral  Resp: 20  Height: 5\' 9"  (1.753 m)  Weight: 126 lb 1.6 oz (57.199 kg)  SpO2: 100%   Constitutional: Vital signs reviewed.  Patient is a well-developed and well-nourished male in no acute distress and cooperative with exam. Alert and oriented x3.  Neck: Supple,  Cardiovascular: RRR, S1 normal, S2 normal,pulses symmetric and intact bilaterally Pulmonary/Chest: Diffuse wheezing throughout. Few rhonchi at the basis.  Abdominal: Soft. Non-tender, non-distended, bowel sounds are normal, Hematology: no cervical adenopathy.  Neurological: A&O x3,

## 2012-04-06 NOTE — Assessment & Plan Note (Signed)
Considering patient's cough and sick contact as well as the need of repeat chest x-ray to followup on pulmonary nodule I will obtain a chest x-ray today. Await results.

## 2012-04-07 LAB — COMPREHENSIVE METABOLIC PANEL
ALT: 10 U/L (ref 0–53)
AST: 18 U/L (ref 0–37)
BUN: 29 mg/dL — ABNORMAL HIGH (ref 6–23)
Calcium: 9 mg/dL (ref 8.4–10.5)
Creat: 1.74 mg/dL — ABNORMAL HIGH (ref 0.50–1.35)
Total Bilirubin: 0.4 mg/dL (ref 0.3–1.2)

## 2012-04-26 ENCOUNTER — Other Ambulatory Visit: Payer: Self-pay | Admitting: Internal Medicine

## 2012-04-26 DIAGNOSIS — J984 Other disorders of lung: Secondary | ICD-10-CM

## 2012-05-04 ENCOUNTER — Encounter: Payer: Self-pay | Admitting: Internal Medicine

## 2012-05-04 ENCOUNTER — Ambulatory Visit (HOSPITAL_COMMUNITY)
Admission: RE | Admit: 2012-05-04 | Discharge: 2012-05-04 | Disposition: A | Discharge status: Home / Self Care | Payer: Medicare Other | Source: Ambulatory Visit | Attending: Internal Medicine | Admitting: Internal Medicine

## 2012-05-04 DIAGNOSIS — R911 Solitary pulmonary nodule: Secondary | ICD-10-CM | POA: Insufficient documentation

## 2012-05-04 DIAGNOSIS — J984 Other disorders of lung: Secondary | ICD-10-CM

## 2012-05-06 ENCOUNTER — Ambulatory Visit (INDEPENDENT_AMBULATORY_CARE_PROVIDER_SITE_OTHER): Payer: Medicare Other | Admitting: *Deleted

## 2012-05-06 DIAGNOSIS — Z23 Encounter for immunization: Secondary | ICD-10-CM

## 2012-07-30 ENCOUNTER — Other Ambulatory Visit: Payer: Self-pay | Admitting: Internal Medicine

## 2012-07-30 DIAGNOSIS — I10 Essential (primary) hypertension: Secondary | ICD-10-CM

## 2012-08-30 ENCOUNTER — Encounter: Payer: Self-pay | Admitting: Gastroenterology

## 2012-08-31 ENCOUNTER — Encounter: Payer: Self-pay | Admitting: Gastroenterology

## 2012-08-31 ENCOUNTER — Ambulatory Visit (INDEPENDENT_AMBULATORY_CARE_PROVIDER_SITE_OTHER): Payer: Medicare Other | Admitting: Internal Medicine

## 2012-08-31 ENCOUNTER — Encounter: Payer: Self-pay | Admitting: Internal Medicine

## 2012-08-31 VITALS — BP 133/73 | HR 74 | Temp 97.3°F | Ht 69.0 in | Wt 131.3 lb

## 2012-08-31 DIAGNOSIS — D638 Anemia in other chronic diseases classified elsewhere: Secondary | ICD-10-CM

## 2012-08-31 DIAGNOSIS — G5600 Carpal tunnel syndrome, unspecified upper limb: Secondary | ICD-10-CM

## 2012-08-31 DIAGNOSIS — Z1211 Encounter for screening for malignant neoplasm of colon: Secondary | ICD-10-CM | POA: Insufficient documentation

## 2012-08-31 DIAGNOSIS — G5601 Carpal tunnel syndrome, right upper limb: Secondary | ICD-10-CM

## 2012-08-31 DIAGNOSIS — G250 Essential tremor: Secondary | ICD-10-CM

## 2012-08-31 DIAGNOSIS — J984 Other disorders of lung: Secondary | ICD-10-CM

## 2012-08-31 DIAGNOSIS — I10 Essential (primary) hypertension: Secondary | ICD-10-CM

## 2012-08-31 DIAGNOSIS — G252 Other specified forms of tremor: Secondary | ICD-10-CM

## 2012-08-31 DIAGNOSIS — N259 Disorder resulting from impaired renal tubular function, unspecified: Secondary | ICD-10-CM

## 2012-08-31 LAB — CBC WITH DIFFERENTIAL/PLATELET
Basophils Absolute: 0 10*3/uL (ref 0.0–0.1)
Eosinophils Absolute: 0.1 10*3/uL (ref 0.0–0.7)
Eosinophils Relative: 2 % (ref 0–5)
Lymphocytes Relative: 43 % (ref 12–46)
Lymphs Abs: 1.6 10*3/uL (ref 0.7–4.0)
MCH: 30.9 pg (ref 26.0–34.0)
MCV: 93.5 fL (ref 78.0–100.0)
Neutrophils Relative %: 50 % (ref 43–77)
Platelets: 221 10*3/uL (ref 150–400)
RBC: 3.53 MIL/uL — ABNORMAL LOW (ref 4.22–5.81)
RDW: 13.8 % (ref 11.5–15.5)
WBC: 3.8 10*3/uL — ABNORMAL LOW (ref 4.0–10.5)

## 2012-08-31 LAB — COMPLETE METABOLIC PANEL WITH GFR
ALT: <8 U/L (ref 0–53)
AST: 12 U/L (ref 0–37)
CO2: 28 mEq/L (ref 19–32)
Creat: 2.12 mg/dL — ABNORMAL HIGH (ref 0.50–1.35)
Sodium: 144 mEq/L (ref 135–145)
Total Bilirubin: 0.3 mg/dL (ref 0.3–1.2)
Total Protein: 6.6 g/dL (ref 6.0–8.3)

## 2012-08-31 LAB — TSH: TSH: 2.37 u[IU]/mL (ref 0.350–4.500)

## 2012-08-31 MED ORDER — PROPRANOLOL HCL 40 MG PO TABS: 40.0000 mg | ORAL_TABLET | Freq: Two times a day (BID) | ORAL | Status: DC

## 2012-08-31 MED ORDER — AMLODIPINE BESYLATE 10 MG PO TABS: ORAL_TABLET | ORAL | Status: DC

## 2012-08-31 NOTE — Assessment & Plan Note (Signed)
I will refer patient for screening colonoscopy.

## 2012-08-31 NOTE — Assessment & Plan Note (Signed)
I will obtain a CBC today

## 2012-08-31 NOTE — Assessment & Plan Note (Signed)
Patient had a pain most consistently with couple tightness syndrome in his right hand. I provided him with a splint. I don't think he needs any pain medication at this point since the pain just occurs on a very irregular basis.

## 2012-08-31 NOTE — Assessment & Plan Note (Signed)
Blood pressure well controlled on Norvasc 10 mg daily. Concerning patient's new diagnosis of essential tremor I will start patient on propanolol 40 mg twice a day and titrate up accordingly. Will reevaluate patient in 4 weeks.

## 2012-08-31 NOTE — Assessment & Plan Note (Signed)
I will start patient on propanolol 40 mg twice a day. I would try to treat up if patient is tolerating it well.

## 2012-08-31 NOTE — Patient Instructions (Signed)
Stop taking amlodipine for your blood pressure  Start taking propanolol 40 mg twice a day for your blood pressure and he'll shakiness of the hands.

## 2012-08-31 NOTE — Progress Notes (Signed)
Subjective:   Patient ID: LUM PFAB male   DOB: [birth year] 72 y.o.   MRN: [id number]  HPI: Jared Woodward is a 72 y.o. male with past medical history significant as outlined below who presented to the clinic for a followup. The patient noted that he has been experiencing some ankle and hand pain.   Right hand: sudden onset of sharp pain in his hand. Most of the pains are in the thumb and index finger and somewhat in the wrist. It seems that  It shoots down from his elbow. No aggravating or alleviating factors. He mostly occurs in the morning hours. Denies any swelling or redness. Denies any trauma to elbow or hand. He further tremor. It occurs while resting but mostly with doing things ( eating, sometimes writing, holding things). He denies any headache, vision changes, weakness, gait instability.  This has been present for a very long time.     Past Medical History  Diagnosis Date  . 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 ANEMIA OF CHRONIC DISEASE 05/08/2006  . ANEMIA, VITAMIN B12 DEFICIENCY NEC 01/12/2007  . GLAUCOMA 05/08/2006  . AMAUROSIS FUGAX 12/24/2007  . HYPERTENSION 05/08/2006  . PULMONARY NODULE 10/15/2007  . RENAL INSUFFICIENCY 05/08/2006  . MENTAL RETARDATION 05/08/2006   Current Outpatient Prescriptions  Medication Sig Dispense Refill  . albuterol (PROVENTIL HFA) 108 (90 BASE) MCG/ACT inhaler Inhale 2 puffs into the lungs every 6 (six) hours as needed for wheezing.  1 Inhaler  0  . amLODipine (NORVASC) 10 MG tablet TAKE ONE TABLET BY MOUTH EVERY DAY  30 tablet  2  . brimonidine (ALPHAGAN) 0.15 % ophthalmic solution 1 drop 3 (three) times daily.        . dorzolamide (TRUSOPT) 2 % ophthalmic solution Place 1 drop into both eyes 2 (two) times daily.        Marland Kitchen latanoprost (XALATAN) 0.005 % ophthalmic solution 1 drop at bedtime.        . vitamin B-12 (CYANOCOBALAMIN) 1000 MCG tablet Take 2 tablet daily per mouth.  60 tablet  3   No current facility-administered  medications for this visit.   No family history on file. History   Social History  . Marital Status: Widowed    Spouse Name: N/A    Number of Children: 0  . Years of Education: 2nd grade   Occupational History  . KITCHEN [employer]   Social History Main Topics  . Smoking status: Former Smoker -- 0.10 packs/day for 5 years    Types: Cigarettes    Quit date: 05/03/1988  . Smokeless tobacco: None  . Alcohol Use: No  . Drug Use: No  . Sexually Active: None   Other Topics Concern  . None   Social History Narrative   Patient lives Jared Woodward who seems to be the main Care Giver. The patient works at  [employer] at [employer].      Cannot read or write (except his name)   Review of Systems: Constitutional: Denies fever, chills, diaphoresis, appetite change and fatigue.  Respiratory: Denies SOB, DOE, cough, chest tightness,  and wheezing.   Cardiovascular: Denies chest pain, palpitations and leg swelling.  Gastrointestinal: Denies nausea, vomiting, abdominal pain, diarrhea, constipation, blood in stool and abdominal distention.  Genitourinary: Denies dysuria, urgency, frequency, hematuria, flank pain and difficulty urinating.  Endocrine: Denies: hot or cold intolerance, sweats, changes in hair or nails, polyuria, polydipsia. Skin: Denies pallor, rash and wound.  Neurological: Denies dizziness, , weakness, light-headedness, numbness and  headaches.    Objective:  Physical Exam: Filed Vitals:   08/31/12 1053  BP: 133/73  Pulse: 74  Temp: 97.3 F (36.3 C)  TempSrc: Oral  Height: 5\' 9"  (1.753 m)  Weight: 131 lb 4.8 oz (59.557 kg)  SpO2: 100%   Constitutional: Vital signs reviewed.  Patient is a well-developed and well-nourished male in no acute distress and cooperative with exam. Alert and oriented x3.  Eyes: PERRL, EOMI, conjunctivae normal, No scleral icterus.  Neck: Supple,  Cardiovascular: RRR, S1 normal, S2 normal, no MRG, pulses symmetric and intact  bilaterally Pulmonary/Chest: CTAB, no wheezes, rales, or rhonchi Abdominal: Soft. Non-tender, non-distended, bowel sounds are normal,  Musculoskeletal: No joint deformities, erythema, or stiffness, ROM full and no nontender. Tremor present with action  Hematology: no cervical adenopathy.  Neurological: A&O x3, Strength is normal and symmetric bilaterally, cranial nerve II-XII are grossly intact, no focal motor deficit, sensory intact to light touch bilaterally.  Skin: Warm, dry and intact. No rash, cyanosis, or clubbing.

## 2012-08-31 NOTE — Assessment & Plan Note (Signed)
I will obtain a basic metabolic panel today to monitor renal function.

## 2012-09-01 ENCOUNTER — Other Ambulatory Visit: Payer: Self-pay | Admitting: Internal Medicine

## 2012-09-01 DIAGNOSIS — D472 Monoclonal gammopathy: Secondary | ICD-10-CM

## 2012-09-01 NOTE — Progress Notes (Signed)
Case discussed with Dr. Newt Lukes at the time of the visit. We reviewed the resident's history and exam and pertinent patient test results. I agree with the assessment, diagnosis and plan of care documented in the resident's note.  Anemia is stable but renal function has worsened slightly.  This may be progression of underlying chronic kidney disease but will be sure to recheck at the follow-up visit within 4 weeks.  If the renal function remains above baseline further evaluation will be required.

## 2012-09-01 NOTE — Addendum Note (Signed)
Addended by: Orson Gear on: 09/01/2012 01:48 PM   Modules accepted: Orders

## 2012-09-02 LAB — PHOSPHORUS: Phosphorus: 3.2 mg/dL (ref 2.3–4.6)

## 2012-09-03 LAB — VITAMIN D 25 HYDROXY (VIT D DEFICIENCY, FRACTURES): Vit D, 25-Hydroxy: 20 ng/mL — ABNORMAL LOW (ref 30–89)

## 2012-09-15 ENCOUNTER — Ambulatory Visit (AMBULATORY_SURGERY_CENTER): Payer: Medicare Other | Admitting: *Deleted

## 2012-09-15 VITALS — Ht 69.0 in | Wt 131.2 lb

## 2012-09-15 DIAGNOSIS — Z1211 Encounter for screening for malignant neoplasm of colon: Secondary | ICD-10-CM

## 2012-09-15 MED ORDER — MOVIPREP 100 G PO SOLR: ORAL | Status: DC

## 2012-09-27 ENCOUNTER — Other Ambulatory Visit: Payer: Self-pay | Admitting: Internal Medicine

## 2012-09-27 DIAGNOSIS — E559 Vitamin D deficiency, unspecified: Secondary | ICD-10-CM

## 2012-09-27 MED ORDER — VITAMIN D 600 IU CAPSULE SWOG S0812: 600.0000 [IU] | ORAL_CAPSULE | Freq: Every day | ORAL | Status: DC

## 2012-09-28 ENCOUNTER — Encounter: Payer: Self-pay | Admitting: Gastroenterology

## 2012-09-28 ENCOUNTER — Ambulatory Visit (AMBULATORY_SURGERY_CENTER): Payer: Medicare Other | Admitting: Gastroenterology

## 2012-09-28 VITALS — BP 134/61 | HR 42 | Temp 96.1°F | Resp 18 | Ht 69.0 in | Wt 131.0 lb

## 2012-09-28 DIAGNOSIS — Z1211 Encounter for screening for malignant neoplasm of colon: Secondary | ICD-10-CM

## 2012-09-28 MED ORDER — SODIUM CHLORIDE 0.9 % IV SOLN: 500.0000 mL | INTRAVENOUS | Status: DC

## 2012-09-28 NOTE — Progress Notes (Signed)
Patient did not experience any of the following events: a burn prior to discharge; a fall within the facility; wrong site/side/patient/procedure/implant event; or a hospital transfer or hospital admission upon discharge from the facility. (G8907) Patient did not have preoperative order for IV antibiotic SSI prophylaxis. (G8918)  

## 2012-09-28 NOTE — Patient Instructions (Addendum)

## 2012-09-28 NOTE — Op Note (Signed)
[name], [id number]   COLONOSCOPY PROCEDURE REPORT  PATIENT: Jared Woodward, Jared Woodward.  MR#: [id number] BIRTHDATE: [birth year] , 71  yrs. old GENDER: Male ENDOSCOPIST: Ladene Artist, MD, [facility] REFERRED BY: PROCEDURE DATE:  09/28/2012 PROCEDURE:   Colonoscopy, screening ASA CLASS:   Class II INDICATIONS:average risk screening and Last colonoscopy performed 10 years ago. MEDICATIONS: MAC sedation, administered by CRNA and propofol (Diprivan) 150mg  IV DESCRIPTION OF PROCEDURE:   After the risks benefits and alternatives of the procedure were thoroughly explained, informed consent was obtained.  A digital rectal exam revealed no abnormalities of the rectum.   The LB [id number]  endoscope was introduced through the anus and advanced to the cecum, which was identified by both the appendix and ileocecal valve. No adverse events experienced.   The quality of the prep was excellent, using MoviPrep  The instrument was then slowly withdrawn as the colon was fully examined.  COLON FINDINGS: A normal appearing cecum, ileocecal valve, and appendiceal orifice were identified.  The ascending, hepatic flexure, transverse, splenic flexure, descending, sigmoid colon and rectum appeared unremarkable.  No polyps or cancers were seen. Retroflexed views revealed small internal hemorrhoids. The time to cecum=1 minutes 32 seconds.  Withdrawal time=8 minutes 13 seconds. The scope was withdrawn and the procedure completed.  COMPLICATIONS: There were no complications.  ENDOSCOPIC IMPRESSION: 1.  Normal colon 2.  Small internal hemorrhoids  RECOMMENDATIONS: 1.  Continue to follow colorectal cancer screening guidelines for "routine risk" patients with a repeat colonoscopy in 10 years. There is no need for routine screending FOBT (stool) testing for at least 5 years.  eSigned:  Ladene Artist, MD, [facility] 09/28/2012 10:28 AM

## 2012-09-29 ENCOUNTER — Telehealth: Payer: Self-pay

## 2012-09-29 NOTE — Telephone Encounter (Signed)
  Follow up Call-  Call back number 09/28/2012  Post procedure Call Back phone  # [phone] HOME   [phone] CELL  Permission to leave phone message Yes     Patient questions:  Do you have a fever, pain , or abdominal swelling? no Pain Score  0 *  Have you tolerated food without any problems? yes  Have you been able to return to your normal activities? yes  Do you have any questions about your discharge instructions: Diet   no Medications  no Follow up visit  no  Do you have questions or concerns about your Care? no  Actions: * If pain score is 4 or above: No action needed, pain <4.

## 2012-10-04 ENCOUNTER — Ambulatory Visit (HOSPITAL_BASED_OUTPATIENT_CLINIC_OR_DEPARTMENT_OTHER): Payer: Medicare Other | Admitting: Internal Medicine

## 2012-10-04 ENCOUNTER — Encounter: Payer: Self-pay | Admitting: Internal Medicine

## 2012-10-04 VITALS — BP 166/83 | HR 52 | Temp 98.3°F | Ht 69.0 in | Wt 130.9 lb

## 2012-10-04 DIAGNOSIS — I10 Essential (primary) hypertension: Secondary | ICD-10-CM

## 2012-10-04 NOTE — Progress Notes (Signed)
Subjective:   Patient ID: Jared Woodward male   DOB: [birth year] 72 y.o.   MRN: [id number]  HPI: Jared Woodward is a 72 y.o. male with past medical history significant as outlined below who presented to the clinic for a followup. Patient was evaluated during the last office visit for tremors and was started on Propranolol. Patient reports his stamina has been significantly improved. He noted he has been doing fine. Denies any chest pain, dizziness, shortness of breath. His sister is present during this office visit. She is the main caretaker who reported that he had been not taking his medication for the last 2-3 days.    Past Medical History  Diagnosis Date  . 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 ANEMIA OF CHRONIC DISEASE 05/08/2006  . ANEMIA, VITAMIN B12 DEFICIENCY NEC 01/12/2007  . GLAUCOMA 05/08/2006  . AMAUROSIS FUGAX 12/24/2007  . HYPERTENSION 05/08/2006  . PULMONARY NODULE 10/15/2007  . RENAL INSUFFICIENCY 05/08/2006  . MENTAL RETARDATION 05/08/2006   Current Outpatient Prescriptions  Medication Sig Dispense Refill  . albuterol (PROVENTIL HFA) 108 (90 BASE) MCG/ACT inhaler Inhale 2 puffs into the lungs every 6 (six) hours as needed for wheezing.  1 Inhaler  0  . aspirin 81 MG tablet Take 81 mg by mouth daily.      . brimonidine (ALPHAGAN) 0.15 % ophthalmic solution 1 drop 3 (three) times daily.        . dorzolamide (TRUSOPT) 2 % ophthalmic solution Place 1 drop into both eyes 2 (two) times daily.        . Investigational vitamin D 600 UNITS capsule SWOG S0812 Take 1 capsule (600 Units total) by mouth daily. Take with food.  30 capsule  2  . latanoprost (XALATAN) 0.005 % ophthalmic solution 1 drop at bedtime.        . propranolol (INDERAL) 40 MG tablet Take 1 tablet (40 mg total) by mouth 2 (two) times daily.  60 tablet  2  . vitamin B-12 (CYANOCOBALAMIN) 1000 MCG tablet Take 2 tablet daily per mouth.  60 tablet  3   No current facility-administered medications for this  visit.   Family History  Problem Relation Age of Onset  . Colon cancer Neg Hx   . Colon polyps Neg Hx   . Stomach cancer Neg Hx   . Rectal cancer Neg Hx    History   Social History  . Marital Status: Widowed    Spouse Name: N/A    Number of Children: 0  . Years of Education: 2nd grade   Occupational History  . KITCHEN [employer]   Social History Main Topics  . Smoking status: Former Smoker -- 0.10 packs/day for 5 years    Types: Cigarettes    Quit date: 05/03/1988  . Smokeless tobacco: Never Used  . Alcohol Use: No  . Drug Use: No  . Sexually Active: None   Other Topics Concern  . None   Social History Narrative   Patient lives Jared Woodward who seems to be the main Care Giver. The patient works at  [employer] at [employer].      Cannot read or write (except his name)   Review of Systems: Constitutional: Denies fever, chills, diaphoresis, appetite change and fatigue.    Respiratory: Denies SOB, DOE, cough, chest tightness,  and wheezing.   Cardiovascular: Denies chest pain, palpitations and leg swelling.  Gastrointestinal: Denies nausea, vomiting, abdominal pain, diarrhea, constipation, blood in stool and abdominal distention.  Genitourinary: Denies dysuria, urgency,  frequency, hematuria, flank pain and difficulty urinating.  Neurological: Denies dizziness, syncope, weakness, light-headedness, numbness and headaches.    Objective:  Physical Exam: Filed Vitals:   10/04/12 1555  BP: 166/83  Pulse: 52  Temp: 98.3 F (36.8 C)  TempSrc: Oral  Height: 5\' 9"  (1.753 m)  Weight: 130 lb 14.4 oz (59.376 kg)  SpO2: 100%   Constitutional: Vital signs reviewed.  Patient is a well-developed and well-nourished male n no acute distress and cooperative with exam. Alert and oriented x3. .  Neck: Supple,  Cardiovascular: RRR, S1 normal, S2 normal, no MRG, pulses symmetric and intact bilaterally Pulmonary/Chest: normal respiratory effort, CTAB, no wheezes, rales, or  rhonchi Abdominal: Soft. Non-tender, non-distended, bowel sounds are normal, no masses, organomegaly, or guarding present.  Neurological: A&O x3, Strength is normal and symmetric bilaterally, cranial nerve II-XII are grossly intact, no focal motor deficit, sensory intact to light touch bilaterally.

## 2012-10-05 NOTE — Progress Notes (Signed)
Case discussed with Dr. Newt Lukes immediately after the resident saw the patient.  We reviewed the resident's history and exam and pertinent patient test results.  I agree with the assessment, diagnosis and plan of care documented in the resident's note.

## 2012-10-05 NOTE — Assessment & Plan Note (Signed)
Refer patient again to nephrology for further evaluation and management.

## 2012-10-05 NOTE — Assessment & Plan Note (Signed)
Blood pressure elevated during this office visit. Patient was started on propranolol during the last office visit but he has been not taking it for a few days. Prior to 08/31/12 patient was on amlodipine which controlled his blood pressure when he well. I will continue propranolol at this point. Reevaluate during the next office visit. Monitor for bradycardia.( During this office visit HR of 52)  BP Readings from Last 3 Encounters:  10/04/12 166/83  09/28/12 134/61  08/31/12 133/73  .

## 2012-10-18 ENCOUNTER — Ambulatory Visit (INDEPENDENT_AMBULATORY_CARE_PROVIDER_SITE_OTHER): Payer: PRIVATE HEALTH INSURANCE | Admitting: Internal Medicine

## 2012-10-18 ENCOUNTER — Encounter: Payer: Self-pay | Admitting: Internal Medicine

## 2012-10-18 VITALS — BP 157/90 | HR 62 | Temp 96.7°F | Ht 69.0 in | Wt 127.0 lb

## 2012-10-18 DIAGNOSIS — I10 Essential (primary) hypertension: Secondary | ICD-10-CM

## 2012-10-18 DIAGNOSIS — D472 Monoclonal gammopathy: Secondary | ICD-10-CM

## 2012-10-18 MED ORDER — AMLODIPINE BESYLATE 5 MG PO TABS: 5.0000 mg | ORAL_TABLET | Freq: Every day | ORAL | Status: DC

## 2012-10-18 NOTE — Progress Notes (Signed)
HPI The patient is a 72 y.o. male with a history of HTN, mild cognitive impairment, CKD, presenting for a BP re-check.  The patient has a history of HTN.  The patient was previously controlled on amlodipine.  The patient was taken off amlodipine when he was started on propranolol (for essential tremor), out of concern for hypotension.  The patient's BP is still elevated today.  The patient has a history of CKD, thought to be due to long-standing HTN.  He was referred to [place], but has not yet had an appointment made.  The patient has a history of MGUS, followed by Oncology.  The patient has not seen Oncology in > 1 year.   ROS: General: no fevers, chills, changes in weight, changes in appetite Skin: no rash HEENT: no blurry vision, hearing changes, sore throat Pulm: no dyspnea, coughing, wheezing CV: no chest pain, palpitations, shortness of breath Abd: no abdominal pain, nausea/vomiting, diarrhea/constipation GU: no dysuria, hematuria, polyuria Ext: no arthralgias, myalgias Neuro: no weakness, numbness, or tingling  Filed Vitals:   10/18/12 1038  BP: 157/90  Pulse: 62  Temp: 96.7 F (35.9 C)    PEX General: alert, cooperative, and in no apparent distress HEENT: pupils equal round and reactive to light, vision grossly intact, oropharynx clear and non-erythematous  Neck: supple, no lymphadenopathy Lungs: clear to ascultation bilaterally, normal work of respiration, no wheezes, rales, ronchi Heart: regular rate and rhythm, no murmurs, gallops, or rubs Abdomen: soft, non-tender, non-distended, normal bowel sounds Extremities: no cyanosis, clubbing, or edema Neurologic: alert & oriented X3, cranial nerves II-XII intact, strength grossly intact, sensation intact to light touch  Current Outpatient Prescriptions on File Prior to Visit  Medication Sig Dispense Refill  . aspirin 81 MG tablet Take 81 mg by mouth daily.      . brimonidine (ALPHAGAN) 0.15 % ophthalmic solution  1 drop 3 (three) times daily.        . dorzolamide (TRUSOPT) 2 % ophthalmic solution Place 1 drop into both eyes 2 (two) times daily.        . Investigational vitamin D 600 UNITS capsule SWOG S0812 Take 1 capsule (600 Units total) by mouth daily. Take with food.  30 capsule  2  . latanoprost (XALATAN) 0.005 % ophthalmic solution 1 drop at bedtime.        . propranolol (INDERAL) 40 MG tablet Take 1 tablet (40 mg total) by mouth 2 (two) times daily.  60 tablet  2  . vitamin B-12 (CYANOCOBALAMIN) 1000 MCG tablet Take 2 tablet daily per mouth.  60 tablet  3  . albuterol (PROVENTIL HFA) 108 (90 BASE) MCG/ACT inhaler Inhale 2 puffs into the lungs every 6 (six) hours as needed for wheezing.  1 Inhaler  0   No current facility-administered medications on file prior to visit.    Assessment/Plan

## 2012-10-18 NOTE — Assessment & Plan Note (Signed)
The patient has a history of CKD, most recent Cr = 2.12.  CKD is thought to be due to HTN, though the patient does have a history of MGUS.  He was referred to Nephrology, but has not yet been to an appointment -re-refer to nephrology

## 2012-10-18 NOTE — Assessment & Plan Note (Signed)
BP Readings from Last 3 Encounters:  10/18/12 157/90  10/04/12 166/83  09/28/12 134/61    Lab Results  Component Value Date   NA 144 08/31/2012   K 4.8 08/31/2012   CREATININE 2.12* 08/31/2012    Assessment: Blood pressure control: mildly elevated Progress toward BP goal:  unchanged Comments: The patient's BP is still elevated.  We will not increase his BB at this time out of concern for bradycaria.  We will re-add Amlodipine, at a lower dose than he was previously taking.  Plan: Medications:  Continue propranolol.  Start amlodipine 5. Educational resources provided:   Actor tools provided:   Other plans: Recheck in 1 month

## 2012-10-18 NOTE — Patient Instructions (Signed)
General Instructions: Your blood pressure is still elevated today.  We are adding the medication Amlodipine, 1 tablet once per day.  Please return for a follow-up visit in 1 month.   Treatment Goals:  Goals (1 Years of Data) as of 10/18/12         As of Today 10/04/12 09/28/12 09/28/12 09/28/12     Blood Pressure    . Blood Pressure < 140/90  157/90 166/83 134/61 141/80 131/51      Progress Toward Treatment Goals:  Treatment Goal 10/18/2012  Blood pressure at goal    Self Care Goals & Plans:  Self Care Goal 10/18/2012  Manage my medications take my medicines as prescribed; bring my medications to every visit; refill my medications on time  Monitor my health -  Eat healthy foods eat foods that are low in salt; eat baked foods instead of fried foods  Be physically active take a walk every day       Care Management & Community Referrals:  Referral 10/18/2012  Referrals made for care management support none needed

## 2012-10-18 NOTE — Progress Notes (Signed)
Case discussed with Dr. Brown at the time of the visit.  We reviewed the resident's history and exam and pertinent patient test results.  I agree with the assessment, diagnosis, and plan of care documented in the resident's note. 

## 2012-10-18 NOTE — Assessment & Plan Note (Signed)
The patient has a history of MGUS.  He is followed by Oncology, but has not been seen there in > 1 year.  Patient is amenable to following up. -make appointment with oncology for f/u

## 2012-10-27 NOTE — Addendum Note (Signed)
Addended by: Yvonna Alanis E on: 10/27/2012 12:15 PM   Modules accepted: Orders

## 2012-11-18 ENCOUNTER — Ambulatory Visit (INDEPENDENT_AMBULATORY_CARE_PROVIDER_SITE_OTHER): Payer: Medicare Other | Admitting: Internal Medicine

## 2012-11-18 ENCOUNTER — Encounter: Payer: Self-pay | Admitting: Internal Medicine

## 2012-11-18 VITALS — BP 143/75 | HR 58 | Temp 98.0°F | Ht 69.0 in | Wt 125.0 lb

## 2012-11-18 DIAGNOSIS — I10 Essential (primary) hypertension: Secondary | ICD-10-CM

## 2012-11-18 DIAGNOSIS — E559 Vitamin D deficiency, unspecified: Secondary | ICD-10-CM

## 2012-11-18 DIAGNOSIS — G250 Essential tremor: Secondary | ICD-10-CM

## 2012-11-18 DIAGNOSIS — R059 Cough, unspecified: Secondary | ICD-10-CM

## 2012-11-18 DIAGNOSIS — D518 Other vitamin B12 deficiency anemias: Secondary | ICD-10-CM

## 2012-11-18 DIAGNOSIS — G252 Other specified forms of tremor: Secondary | ICD-10-CM

## 2012-11-18 DIAGNOSIS — R05 Cough: Secondary | ICD-10-CM

## 2012-11-18 DIAGNOSIS — D472 Monoclonal gammopathy: Secondary | ICD-10-CM

## 2012-11-18 DIAGNOSIS — E538 Deficiency of other specified B group vitamins: Secondary | ICD-10-CM

## 2012-11-18 DIAGNOSIS — N183 Chronic kidney disease, stage 3 unspecified: Secondary | ICD-10-CM

## 2012-11-18 MED ORDER — PROPRANOLOL HCL 40 MG PO TABS: 40.0000 mg | ORAL_TABLET | Freq: Two times a day (BID) | ORAL | Status: DC

## 2012-11-18 MED ORDER — VITAMIN D (ERGOCALCIFEROL) 1.25 MG (50000 UNIT) PO CAPS: 50000.0000 [IU] | ORAL_CAPSULE | ORAL | Status: DC

## 2012-11-18 MED ORDER — ALBUTEROL SULFATE HFA 108 (90 BASE) MCG/ACT IN AERS: 2.0000 | INHALATION_SPRAY | Freq: Four times a day (QID) | RESPIRATORY_TRACT | Status: DC | PRN

## 2012-11-18 NOTE — Assessment & Plan Note (Signed)
appt with [facility] 12/06/12 at 10:30 am

## 2012-11-18 NOTE — Assessment & Plan Note (Signed)
Repeat vitamin B12 level today. Patient taking 500 mg bid

## 2012-11-18 NOTE — Assessment & Plan Note (Signed)
Called Oncology so patient can have f/u soon [phone]. Oncology to call the patient

## 2012-11-18 NOTE — Assessment & Plan Note (Signed)
Taking vitamin D 5000 IU. Will increase to 50K IU since vitamin D level was 20 08/31/12 Repeat vitamin D level at follow up

## 2012-11-18 NOTE — Patient Instructions (Addendum)
General Instructions: Check your blood pressure regularly and record it and bring to each visit along with medications Follow up with [facility] 12/06/12 at 10:30 am.  We will place a referral for you to follow up with Oncology [phone].  Call them if they do not call you with an appointment  We will check your labs today and send you a letter in the mail with results  Follow up in 4-6 months   DASH Diet The DASH diet stands for "Dietary Approaches to Stop Hypertension." It is a healthy eating plan that has been shown to reduce high blood pressure (hypertension) in as little as 14 days, while also possibly providing other significant health benefits. These other health benefits include reducing the risk of breast cancer after menopause and reducing the risk of type 2 diabetes, heart disease, colon cancer, and stroke. Health benefits also include weight loss and slowing kidney failure in patients with chronic kidney disease.  DIET GUIDELINES  Limit salt (sodium). Your diet should contain less than 1500 mg of sodium daily.  Limit refined or processed carbohydrates. Your diet should include mostly whole grains. Desserts and added sugars should be used sparingly.  Include small amounts of heart-healthy fats. These types of fats include nuts, oils, and tub margarine. Limit saturated and trans fats. These fats have been shown to be harmful in the body. CHOOSING FOODS  The following food groups are based on a 2000 calorie diet. See your Registered Dietitian for individual calorie needs. Grains and Grain Products (6 to 8 servings daily)  Eat More Often: Whole-wheat bread, brown rice, whole-grain or wheat pasta, quinoa, popcorn without added fat or salt (air popped).  Eat Less Often: White bread, white pasta, white rice, cornbread. Vegetables (4 to 5 servings daily)  Eat More Often: Fresh, frozen, and canned vegetables. Vegetables may be raw, steamed, roasted, or grilled with a minimal amount of  fat.  Eat Less Often/Avoid: Creamed or fried vegetables. Vegetables in a cheese sauce. Fruit (4 to 5 servings daily)  Eat More Often: All fresh, canned (in natural juice), or frozen fruits. Dried fruits without added sugar. One hundred percent fruit juice ( cup [237 mL] daily).  Eat Less Often: Dried fruits with added sugar. Canned fruit in light or heavy syrup. [employer], Fish, and Poultry (2 servings or less daily. One serving is 3 to 4 oz [85-114 g]).  Eat More Often: Ninety percent or leaner ground beef, tenderloin, sirloin. Round cuts of beef, chicken breast, [place] breast. All fish. Grill, bake, or broil your meat. Nothing should be fried.  Eat Less Often/Avoid: Fatty cuts of meat, [place], or chicken leg, thigh, or wing. Fried cuts of meat or fish. Dairy (2 to 3 servings)  Eat More Often: Low-fat or fat-free milk, low-fat plain or light yogurt, reduced-fat or part-skim cheese.  Eat Less Often/Avoid: Milk (whole, 2%).Whole milk yogurt. Full-fat cheeses. Nuts, Seeds, and Legumes (4 to 5 servings per week)  Eat More Often: All without added salt.  Eat Less Often/Avoid: Salted nuts and seeds, canned beans with added salt. Fats and Sweets (limited)  Eat More Often: Vegetable oils, tub margarines without trans fats, sugar-free gelatin. Mayonnaise and salad dressings.  Eat Less Often/Avoid: Coconut oils, palm oils, butter, stick margarine, cream, half and half, cookies, candy, pie. FOR MORE INFORMATION The Dash Diet Eating Plan: www.dashdiet.org Document Released: 04/03/2011 Document Revised: 07/07/2011 Document Reviewed: 04/03/2011 [facility] Patient Information 2014 [place], [place].   Hypertension As your heart beats, it forces blood  through your arteries. This force is your blood pressure. If the pressure is too high, it is called hypertension (HTN) or high blood pressure. HTN is dangerous because you may have it and not know it. High blood pressure may mean that your heart has  to work harder to pump blood. Your arteries may be narrow or stiff. The extra work puts you at risk for heart disease, stroke, and other problems.  Blood pressure consists of two numbers, a higher number over a lower, 110/72, for example. It is stated as "110 over 72." The ideal is below 120 for the top number (systolic) and under 80 for the bottom (diastolic). Write down your blood pressure today. You should pay close attention to your blood pressure if you have certain conditions such as:  Heart failure.  Prior heart attack.  Diabetes  Chronic kidney disease.  Prior stroke.  Multiple risk factors for heart disease. To see if you have HTN, your blood pressure should be measured while you are seated with your arm held at the level of the heart. It should be measured at least twice. A one-time elevated blood pressure reading (especially in the Emergency Department) does not mean that you need treatment. There may be conditions in which the blood pressure is different between your right and left arms. It is important to see your caregiver soon for a recheck. Most people have essential hypertension which means that there is not a specific cause. This type of high blood pressure may be lowered by changing lifestyle factors such as:  Stress.  Smoking.  Lack of exercise.  Excessive weight.  Drug/tobacco/alcohol use.  Eating less salt. Most people do not have symptoms from high blood pressure until it has caused damage to the body. Effective treatment can often prevent, delay or reduce that damage. TREATMENT  When a cause has been identified, treatment for high blood pressure is directed at the cause. There are a large number of medications to treat HTN. These fall into several categories, and your caregiver will help you select the medicines that are best for you. Medications may have side effects. You should review side effects with your caregiver. If your blood pressure stays high after  you have made lifestyle changes or started on medicines,   Your medication(s) may need to be changed.  Other problems may need to be addressed.  Be certain you understand your prescriptions, and know how and when to take your medicine.  Be sure to follow up with your caregiver within the time frame advised (usually within two weeks) to have your blood pressure rechecked and to review your medications.  If you are taking more than one medicine to lower your blood pressure, make sure you know how and at what times they should be taken. Taking two medicines at the same time can result in blood pressure that is too low. SEEK IMMEDIATE MEDICAL CARE IF:  You develop a severe headache, blurred or changing vision, or confusion.  You have unusual weakness or numbness, or a faint feeling.  You have severe chest or abdominal pain, vomiting, or breathing problems. MAKE SURE YOU:   Understand these instructions.  Will watch your condition.  Will get help right away if you are not doing well or get worse. Document Released: 04/14/2005 Document Revised: 07/07/2011 Document Reviewed: 12/03/2007 [facility] Patient Information 2014 [place].    Treatment Goals:  Goals (1 Years of Data) as of 11/18/12         As  of Today 10/18/12 10/04/12 09/28/12 09/28/12     Blood Pressure    . Blood Pressure < 140/90  143/75 157/90 166/83 134/61 141/80      Progress Toward Treatment Goals:  Treatment Goal 11/18/2012  Blood pressure improved    Self Care Goals & Plans:  Self Care Goal 11/18/2012  Manage my medications take my medicines as prescribed; bring my medications to every visit; refill my medications on time; follow the sick day instructions if I am sick  Monitor my health keep track of my blood pressure; keep track of my weight  Eat healthy foods eat more vegetables; eat fruit for snacks and desserts; eat foods that are low in salt; eat baked foods instead of fried foods; eat smaller portions;  drink diet soda or water instead of juice or soda  Be physically active take a walk every day; find an activity I enjoy  Meeting treatment goals maintain the current self-care plan       Care Management & Community Referrals:  Referral 11/18/2012  Referrals made for care management support none needed  Referrals made to community resources none

## 2012-11-18 NOTE — Assessment & Plan Note (Signed)
Improved with Propranolol 40 mg bid. Will continue. Rx refill today

## 2012-11-18 NOTE — Assessment & Plan Note (Signed)
BP Readings from Last 3 Encounters:  11/18/12 143/75  10/18/12 157/90  10/04/12 166/83    Lab Results  Component Value Date   NA 144 08/31/2012   K 4.8 08/31/2012   CREATININE 2.12* 08/31/2012    Assessment: Blood pressure control: controlled Progress toward BP goal:  improved (at goal) Comments: none  Plan: Medications:  continue current medications Educational resources provided: brochure;handout;video Self management tools provided: instructions for home blood pressure monitoring Other plans: BMET today

## 2012-11-18 NOTE — Progress Notes (Signed)
  Subjective:    Patient ID: Jared Woodward, male    DOB: Mar 20, 1941, 72 y.o.   MRN: [id number]  HPI Comments: 72 y.o with paraproteinemia (monoclonal), anemia, b12 deficiency, anemia of chronic disease, mild mental retardation, amurosis fugax history, glaucoma, HTN (BP 143/75 today), cardiac arrhythmia, pulmonary infiltrate/nodule, CKD stage 3, idiopathic tremor, systolic murmur, benign essential tremor, carpel tunnel syndrome, vitamin D deficiency.  He presents for follow up for HTN which is improved after addition of Norvasc.  He is doing well.  Propanolol is helping with essential tremor.  He needs a Rx refill of Propranolol and Albuterol inhaler  SH: patient lives with his cousin. He rides a bicycle for exercise.  FH: patient denies      Review of Systems  Respiratory: Negative for shortness of breath.   Cardiovascular: Negative for chest pain and leg swelling.  Neurological: Negative for dizziness and light-headedness.       Objective:   Physical Exam  Nursing note and vitals reviewed. Constitutional: He is oriented to person, place, and time. Vital signs are normal. He appears well-developed and well-nourished. He is cooperative. No distress.  HENT:  Head: Normocephalic and atraumatic.  Mouth/Throat: Oropharynx is clear and moist and mucous membranes are normal. He has dentures. No oropharyngeal exudate.  Eyes: Conjunctivae are normal. Pupils are equal, round, and reactive to light. Right eye exhibits no discharge. Left eye exhibits no discharge. No scleral icterus.  Cardiovascular: Normal rate and regular rhythm.   Murmur heard.  Systolic murmur is present  No lower extremity edema  Pulmonary/Chest: Effort normal and breath sounds normal.  Musculoskeletal: He exhibits no edema.  Neurological: He is alert and oriented to person, place, and time. Gait normal.  Skin: Skin is warm, dry and intact. No rash noted. He is not diaphoretic.  Psychiatric: He has a normal mood and  affect. His speech is normal and behavior is normal. Judgment and thought content normal. Cognition and memory are normal.          Assessment & Plan:  Follow up in 4-6 months, repeat vitamin D level and f/u HTN

## 2012-11-19 ENCOUNTER — Encounter: Payer: Self-pay | Admitting: Internal Medicine

## 2012-11-19 LAB — BASIC METABOLIC PANEL
BUN: 29 mg/dL — ABNORMAL HIGH (ref 6–23)
CO2: 31 mEq/L (ref 19–32)
Chloride: 104 mEq/L (ref 96–112)
Creat: 2.26 mg/dL — ABNORMAL HIGH (ref 0.50–1.35)
Glucose, Bld: 94 mg/dL (ref 70–99)
Potassium: 4.3 mEq/L (ref 3.5–5.3)

## 2012-11-19 NOTE — Progress Notes (Signed)
Case discussed with Dr. McLean soon after the resident saw the patient.  We reviewed the resident's history and exam and pertinent patient test results.  I agree with the assessment, diagnosis, and plan of care documented in the resident's note. 

## 2012-11-22 ENCOUNTER — Telehealth: Payer: Self-pay | Admitting: Oncology

## 2012-11-22 NOTE — Telephone Encounter (Signed)
LM FOR PT TO RETURN CALL.  °

## 2012-11-30 ENCOUNTER — Encounter: Payer: Self-pay | Admitting: *Deleted

## 2012-12-06 ENCOUNTER — Encounter: Payer: Self-pay | Admitting: Cardiology

## 2012-12-07 ENCOUNTER — Telehealth: Payer: Self-pay | Admitting: Oncology

## 2012-12-07 NOTE — Telephone Encounter (Signed)
S/W MS. [name] IN RE TO NP APPT 09/05 @ 10:30 W/DR. SHADAD REFERRING DR. Karlyn Agee DX- MGUS

## 2012-12-14 ENCOUNTER — Telehealth: Payer: Self-pay | Admitting: Oncology

## 2012-12-14 NOTE — Telephone Encounter (Signed)
C/D 12/14/12 for appt. 12/31/12

## 2012-12-17 ENCOUNTER — Other Ambulatory Visit: Payer: Self-pay | Admitting: Oncology

## 2012-12-17 DIAGNOSIS — D472 Monoclonal gammopathy: Secondary | ICD-10-CM

## 2012-12-29 ENCOUNTER — Telehealth: Payer: Self-pay | Admitting: Oncology

## 2012-12-29 NOTE — Telephone Encounter (Signed)
returned pt call and advised on 9.5.14 appt...pt ok and aware

## 2012-12-31 ENCOUNTER — Other Ambulatory Visit (HOSPITAL_BASED_OUTPATIENT_CLINIC_OR_DEPARTMENT_OTHER): Payer: PRIVATE HEALTH INSURANCE | Admitting: Lab

## 2012-12-31 ENCOUNTER — Encounter: Payer: Self-pay | Admitting: Oncology

## 2012-12-31 ENCOUNTER — Ambulatory Visit: Payer: Medicare Other

## 2012-12-31 ENCOUNTER — Telehealth: Payer: Self-pay | Admitting: Oncology

## 2012-12-31 ENCOUNTER — Ambulatory Visit (HOSPITAL_BASED_OUTPATIENT_CLINIC_OR_DEPARTMENT_OTHER): Payer: PRIVATE HEALTH INSURANCE | Admitting: Oncology

## 2012-12-31 VITALS — BP 119/66 | HR 50 | Temp 98.0°F | Resp 17 | Ht 69.0 in | Wt 121.6 lb

## 2012-12-31 DIAGNOSIS — D472 Monoclonal gammopathy: Secondary | ICD-10-CM

## 2012-12-31 DIAGNOSIS — D649 Anemia, unspecified: Secondary | ICD-10-CM

## 2012-12-31 DIAGNOSIS — N289 Disorder of kidney and ureter, unspecified: Secondary | ICD-10-CM

## 2012-12-31 LAB — COMPREHENSIVE METABOLIC PANEL (CC13)
Albumin: 3.3 g/dL — ABNORMAL LOW (ref 3.5–5.0)
BUN: 35.6 mg/dL — ABNORMAL HIGH (ref 7.0–26.0)
CO2: 27 mEq/L (ref 22–29)
Calcium: 9.2 mg/dL (ref 8.4–10.4)
Chloride: 106 mEq/L (ref 98–109)
Glucose: 93 mg/dl (ref 70–140)
Potassium: 4 mEq/L (ref 3.5–5.1)

## 2012-12-31 LAB — CBC WITH DIFFERENTIAL/PLATELET
Basophils Absolute: 0 10*3/uL (ref 0.0–0.1)
Eosinophils Absolute: 0.1 10*3/uL (ref 0.0–0.5)
HGB: 10.6 g/dL — ABNORMAL LOW (ref 13.0–17.1)
MCV: 94.7 fL (ref 79.3–98.0)
MONO%: 5 % (ref 0.0–14.0)
NEUT#: 1.8 10*3/uL (ref 1.5–6.5)
RDW: 13.8 % (ref 11.0–14.6)

## 2012-12-31 NOTE — Telephone Encounter (Signed)
Gave pt appt for lab and MD for September 2015

## 2012-12-31 NOTE — Progress Notes (Signed)
Checked in new pt with no financial concerns. °

## 2012-12-31 NOTE — Progress Notes (Signed)
Hematology and Oncology Follow Up Visit  AMIERE MOHER [id number] 1941/01/24 72 y.o. 12/31/2012 11:24 AM Clinton Gallant, MDSadek, Alinda Sierras, MD   Principle Diagnosis: This is a 72 year old gentleman with monoclonal gammopathy of undetermined significance, rule out plasma cell disorder, diagnosed in 2009.  At that time had presented with an IgA kappa subtype.  Current therapy: Observation and surveillance.  Interim History:  Mr. Samuels presents today for a followup visit.  I had initially evaluated him back in July 2009 and then in 2012 for the same issue.  This gentleman has a longstanding history of hypertension and chronic renal insufficiency related to that.  He also was found to have a monoclonal protein, indicating possible plasma cell disorder.  At that time, his workup was really unrevealing for anything to suggest a plasma cell disorder.  A skeletal survey was unremarkable and was done in June 2009 which showed a 5.6 mm lucent area which was probably a venous lake rather than any lytic bony lesion. This was repeated in 2012 and unchanged.  His protein studies in 2009 showed that he had an M spike of 0.29 g/dL, slightly elevated IgA subtype at 582, he had normal kappa and lambda free light chain, and immunofixation showed an IgA lambda.  At that time, I recommended active surveillance, but Mr. Rarey has not shown up in followup.  He was referred again by his primary care physician for reevaluation. Since his last visit, really no major changes in his health. He 8 slight worsening in his renal function but have been following up with nephrology and really no new complaints.  Medications: I have reviewed the patient's current medications.  Current Outpatient Prescriptions  Medication Sig Dispense Refill  . albuterol (PROVENTIL HFA) 108 (90 BASE) MCG/ACT inhaler Inhale 2 puffs into the lungs every 6 (six) hours as needed for wheezing.  1 Inhaler  2  . amLODipine (NORVASC) 5 MG tablet Take 1 tablet (5  mg total) by mouth daily.  30 tablet  11  . aspirin 81 MG tablet Take 81 mg by mouth daily.      . chlorthalidone (HYGROTON) 25 MG tablet       . dorzolamide (TRUSOPT) 2 % ophthalmic solution Place 1 drop into both eyes 2 (two) times daily.        . propranolol (INDERAL) 40 MG tablet Take 1 tablet (40 mg total) by mouth 2 (two) times daily.  60 tablet  3  . thiamine 250 MG tablet Take 250 mg by mouth daily.      . TRAVATAN Z 0.004 % SOLN ophthalmic solution       . vitamin B-12 (CYANOCOBALAMIN) 1000 MCG tablet 500 mcg 2 (two) times daily. Take 2 tablet daily per mouth.      . Vitamin D, Ergocalciferol, (DRISDOL) 50000 UNITS CAPS Take 1 capsule (50,000 Units total) by mouth every 7 (seven) days.  4 capsule  3   No current facility-administered medications for this visit.     Allergies:  Allergies  Allergen Reactions  . Lisinopril Cough    Past Medical History, Surgical history, Social history, and Family History were reviewed and updated.  Review of Systems:  Remaining ROS negative. Physical Exam: Blood pressure 119/66, pulse 50, temperature 98 F (36.7 C), temperature source Oral, resp. rate 17, height 5\' 9"  (1.753 m), weight 121 lb 9.6 oz (55.157 kg), SpO2 100.00%. ECOG: 1 General appearance: alert, cooperative and appears stated age Head: Normocephalic, without obvious abnormality, atraumatic Neck: no adenopathy, no  carotid bruit, no JVD, supple, symmetrical, trachea midline and thyroid not enlarged, symmetric, no tenderness/mass/nodules Lymph nodes: Cervical, supraclavicular, and axillary nodes normal. Heart:regular rate and rhythm, S1, S2 normal, no murmur, click, rub or gallop Lung:chest clear, no wheezing, rales, normal symmetric air entry Abdomin: soft, non-tender, without masses or organomegaly EXT:no erythema, induration, or nodules   Lab Results: Lab Results  Component Value Date   WBC 3.3* 12/31/2012   HGB 10.6* 12/31/2012   HCT 31.0* 12/31/2012   MCV 94.7 12/31/2012    PLT 179 12/31/2012     Chemistry      Component Value Date/Time   NA 141 12/31/2012 1041   NA 143 11/18/2012 1031   K 4.0 12/31/2012 1041   K 4.3 11/18/2012 1031   CL 104 11/18/2012 1031   CO2 27 12/31/2012 1041   CO2 31 11/18/2012 1031   BUN 35.6* 12/31/2012 1041   BUN 29* 11/18/2012 1031   CREATININE 2.4* 12/31/2012 1041   CREATININE 2.26* 11/18/2012 1031   CREATININE 1.92* 02/27/2011 0950      Component Value Date/Time   CALCIUM 9.2 12/31/2012 1041   CALCIUM 9.2 11/18/2012 1031   ALKPHOS 57 12/31/2012 1041   ALKPHOS 67 08/31/2012 1145   AST 14 12/31/2012 1041   AST 12 08/31/2012 1145   ALT 10 12/31/2012 1041   ALT <8 08/31/2012 1145   BILITOT 0.46 12/31/2012 1041   BILITOT 0.3 08/31/2012 1145      Impression and Plan:  This is a pleasant 72 year old gentleman with the following issues: 1. IgA lambda monoclonal protein, most likely represents a monoclonal gammopathy of undetermined significance, rule out plasma cell disorder or multiple myeloma.  Again, he was initially evaluated about 5 years ago, and these parameters have not really changed over 5 years, which make it less likely a multiple myeloma and make it more likely to be a plasma cell disorder in the form of monoclonal gammopathy of undetermined significance, which I think is what is going on at this time.  However, given the fact that an IgA subtype is at high risk of progressing into multiple myeloma, we will continue to monitor him closely from that standpoint.  I would also like to repeat his protein studies in about 12 months, and certainly, if he has any major changes then we will stage him with a bone marrow biopsy at this time. 2. Renal insufficiency and mild anemia.  I think they are all related to longstanding hypertension.  I do not think there is an element of a plasma cell disorder at this time. 3. Follow up: in 12 months.   [facility], MD 9/5/201411:24 AM

## 2013-01-04 LAB — SPEP & IFE WITH QIG
Beta 2: 10.7 % — ABNORMAL HIGH (ref 3.2–6.5)
Gamma Globulin: 11.3 % (ref 11.1–18.8)
IgA: 380 mg/dL — ABNORMAL HIGH (ref 68–379)
IgG (Immunoglobin G), Serum: 683 mg/dL (ref 650–1600)
IgM, Serum: 18 mg/dL — ABNORMAL LOW (ref 41–251)

## 2013-01-26 ENCOUNTER — Encounter (HOSPITAL_COMMUNITY): Payer: Self-pay | Admitting: *Deleted

## 2013-01-26 ENCOUNTER — Emergency Department (INDEPENDENT_AMBULATORY_CARE_PROVIDER_SITE_OTHER)
Admission: EM | Admit: 2013-01-26 | Discharge: 2013-01-26 | Disposition: A | Discharge status: Home / Self Care | Payer: PRIVATE HEALTH INSURANCE | Source: Home / Self Care | Attending: Emergency Medicine | Admitting: Emergency Medicine

## 2013-01-26 ENCOUNTER — Emergency Department (INDEPENDENT_AMBULATORY_CARE_PROVIDER_SITE_OTHER): Payer: PRIVATE HEALTH INSURANCE

## 2013-01-26 DIAGNOSIS — I10 Essential (primary) hypertension: Secondary | ICD-10-CM

## 2013-01-26 DIAGNOSIS — M25579 Pain in unspecified ankle and joints of unspecified foot: Secondary | ICD-10-CM

## 2013-01-26 DIAGNOSIS — M19019 Primary osteoarthritis, unspecified shoulder: Secondary | ICD-10-CM

## 2013-01-26 DIAGNOSIS — M25572 Pain in left ankle and joints of left foot: Secondary | ICD-10-CM

## 2013-01-26 MED ORDER — CLONIDINE HCL 0.1 MG PO TABS
0.2000 mg | ORAL_TABLET | Freq: Once | ORAL | Status: AC
  Administered 2013-01-26: 0.2 mg via ORAL

## 2013-01-26 MED ORDER — CLONIDINE HCL 0.2 MG PO TABS: 0.2000 mg | ORAL_TABLET | Freq: Two times a day (BID) | ORAL | Status: DC

## 2013-01-26 MED ORDER — HYDROCODONE-ACETAMINOPHEN 5-325 MG PO TABS
1.0000 | ORAL_TABLET | Freq: Once | ORAL | Status: AC
  Administered 2013-01-26: 1 via ORAL

## 2013-01-26 MED ORDER — HYDROCODONE-ACETAMINOPHEN 5-325 MG PO TABS
ORAL_TABLET | ORAL | Status: AC
  Filled 2013-01-26: qty 1

## 2013-01-26 MED ORDER — DICLOFENAC SODIUM 1 % TD GEL: TRANSDERMAL | Status: DC

## 2013-01-26 MED ORDER — TRAMADOL HCL 50 MG PO TABS: 100.0000 mg | ORAL_TABLET | Freq: Three times a day (TID) | ORAL | Status: DC | PRN

## 2013-01-26 MED ORDER — CLONIDINE HCL 0.1 MG PO TABS
ORAL_TABLET | ORAL | Status: AC
  Filled 2013-01-26: qty 2
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
## 2013-01-26 NOTE — ED Notes (Signed)
Pt  Reports  Pain l  Ankle  Which he reports     Started recently       Pt  denys  Any  specefic  Injury   -  He  Ambulated  To  Room  With a  Steady  Fluid  Gait

## 2013-03-03 ENCOUNTER — Other Ambulatory Visit: Payer: Self-pay

## 2013-03-18 ENCOUNTER — Ambulatory Visit (INDEPENDENT_AMBULATORY_CARE_PROVIDER_SITE_OTHER): Payer: PRIVATE HEALTH INSURANCE | Admitting: Internal Medicine

## 2013-03-18 ENCOUNTER — Encounter: Payer: Self-pay | Admitting: Internal Medicine

## 2013-03-18 VITALS — BP 128/77 | HR 63 | Temp 96.8°F | Ht 68.5 in | Wt 119.1 lb

## 2013-03-18 DIAGNOSIS — G252 Other specified forms of tremor: Secondary | ICD-10-CM

## 2013-03-18 DIAGNOSIS — E559 Vitamin D deficiency, unspecified: Secondary | ICD-10-CM

## 2013-03-18 DIAGNOSIS — D472 Monoclonal gammopathy: Secondary | ICD-10-CM

## 2013-03-18 DIAGNOSIS — I10 Essential (primary) hypertension: Secondary | ICD-10-CM

## 2013-03-18 DIAGNOSIS — M25519 Pain in unspecified shoulder: Secondary | ICD-10-CM

## 2013-03-18 DIAGNOSIS — R059 Cough, unspecified: Secondary | ICD-10-CM

## 2013-03-18 DIAGNOSIS — R05 Cough: Secondary | ICD-10-CM

## 2013-03-18 DIAGNOSIS — G8929 Other chronic pain: Secondary | ICD-10-CM

## 2013-03-18 DIAGNOSIS — G250 Essential tremor: Secondary | ICD-10-CM

## 2013-03-18 MED ORDER — DORZOLAMIDE HCL 2 % OP SOLN: 1.0000 [drp] | Freq: Two times a day (BID) | OPHTHALMIC | Status: DC

## 2013-03-18 MED ORDER — ALBUTEROL SULFATE HFA 108 (90 BASE) MCG/ACT IN AERS: 2.0000 | INHALATION_SPRAY | Freq: Four times a day (QID) | RESPIRATORY_TRACT | Status: DC | PRN

## 2013-03-18 MED ORDER — PROPRANOLOL HCL 40 MG PO TABS: 40.0000 mg | ORAL_TABLET | Freq: Two times a day (BID) | ORAL | Status: DC

## 2013-03-18 MED ORDER — TRAMADOL HCL 50 MG PO TABS: 100.0000 mg | ORAL_TABLET | Freq: Three times a day (TID) | ORAL | Status: DC | PRN

## 2013-03-18 MED ORDER — CHLORTHALIDONE 25 MG PO TABS: 25.0000 mg | ORAL_TABLET | Freq: Every day | ORAL | Status: DC

## 2013-03-18 MED ORDER — AMLODIPINE BESYLATE 5 MG PO TABS: 5.0000 mg | ORAL_TABLET | Freq: Every day | ORAL | Status: DC

## 2013-03-18 NOTE — Assessment & Plan Note (Signed)
well- controlled. Will continue current medication BP Readings from Last 3 Encounters:  03/18/13 128/77  01/26/13 112/71  12/31/12 119/66

## 2013-03-18 NOTE — Patient Instructions (Signed)
LIFESTYLE TIPS TO HELP WITH YOUR BLOOD PRESSURE CONTROL  WEIGHT REDUCTION:  Strategies: A healthy weight loss program includes:  A calorie restricted diet based on individual calorie needs.   Increased physical activity (exercise).  An exercise program is just as important as the right low-calorie diet.    An unhealthy weight loss program includes:  Fasting.   Fad diets.   Supplements and drugs.  These choices do not succeed in long-term weight control.   Home Care Instructions: To help you make the needed dietary changes:   Exercise and perform physical activity as directed by your caregiver.   Keep a daily record of everything you eat. There are many free websites to help you with this. It may be helpful to measure your foods so you can determine if you are eating the correct portion sizes.   Use low-calorie cookbooks or take special cooking classes.   Avoid alcohol. Drink more water and drinks with no calories.   Take vitamins and supplements only as recommended by your caregiver.   Weight loss support groups, Registered Dieticians, counselors, and stress reduction education can also be very helpful.   ________________________________________________________________________  DASH DIET:  The DASH diet stands for "Dietary Approaches to Stop Hypertension." It is a healthy eating plan that has been shown to reduce high blood pressure (hypertension) in as little as 14 days, while also possibly providing other significant health benefits. These other health benefits include reducing the risk of breast cancer after menopause and reducing the risk of type 2 diabetes, heart disease, colon cancer, and stroke. Health benefits also include weight loss and slowing kidney failure in patients with chronic kidney disease.   Diet guidelines: Limit salt (sodium). Your diet should contain less than 1500 mg of sodium daily.  Limit refined or processed carbohydrates. Your diet should  include mostly whole grains. Desserts and added sugars should be used sparingly.  Include small amounts of heart-healthy fats. These types of fats include nuts, oils, and tub margarine. Limit saturated and trans fats. These fats have been shown to be harmful in the body.   Choosing Foods: The following food groups are based on a 2000 calorie diet. See your Registered Dietitian for individual calorie needs.  Grains and Grain Products (6 to 8 servings daily)  Eat More Often: Whole-wheat bread, brown rice, whole-grain or wheat pasta, quinoa, popcorn without added fat or salt (air popped).  Eat Less Often: White bread, white pasta, white rice, cornbread.  Vegetables (4 to 5 servings daily)  Eat More Often: Fresh, frozen, and canned vegetables. Vegetables may be raw, steamed, roasted, or grilled with a minimal amount of fat.  Eat Less Often/Avoid: Creamed or fried vegetables. Vegetables in a cheese sauce.  Fruit (4 to 5 servings daily)  Eat More Often: All fresh, canned (in natural juice), or frozen fruits. Dried fruits without added sugar. One hundred percent fruit juice ( cup [237 mL] daily).  Eat Less Often: Dried fruits with added sugar. Canned fruit in light or heavy syrup.  [employer], Fish, and Poultry (2 servings or less daily. One serving is 3 to 4 oz [85-114 g]).  Eat More Often: Ninety percent or leaner ground beef, tenderloin, sirloin. Round cuts of beef, chicken breast, [place] breast. All fish. Grill, bake, or broil your meat. Nothing should be fried.  Eat Less Often/Avoid: Fatty cuts of meat, [place], or chicken leg, thigh, or wing. Fried cuts of meat or fish.  Dairy (2 to 3 servings)  Eat More  Often: Low-fat or fat-free milk, low-fat plain or light yogurt, reduced-fat or part-skim cheese.  Eat Less Often/Avoid: Milk (whole, 2%, skim, or chocolate). Whole milk yogurt. Full-fat cheeses.  Nuts, Seeds, and Legumes (4 to 5 servings per week)  Eat More Often: All without added salt.  Eat  Less Often/Avoid: Salted nuts and seeds, canned beans with added salt.  Fats and Sweets (limited)  Eat More Often: Vegetable oils, tub margarines without trans fats, sugar-free gelatin. Mayonnaise and salad dressings.  Eat Less Often/Avoid: Coconut oils, palm oils, butter, stick margarine, cream, half and half, cookies, candy, pie.   ________________________________________________________________________  Smoking Cessation Tips 1-800-QUIT-NOW  This document explains the best ways for you to quit smoking and new treatments to help. It lists new medicines that can double or triple your chances of quitting and quitting for good. It also considers ways to avoid relapses and concerns you may have about quitting, including weight gain.   Nicotine: A Powerful Addiction If you have tried to quit smoking, you know how hard it can be. It is hard because nicotine is a very addictive drug. For some people, it can be as addictive as heroin or cocaine. Usually, people make 2 or 3 tries, or more, before finally being able to quit. Each time you try to quit, you can learn about what helps and what hurts. Quitting takes hard work and a lot of effort, but you can quit smoking.   Quitting smoking is one of the most important things you will ever do You will live longer, feel better, and live better.  The impact on your body of quitting smoking is felt almost immediately:   Five keys to quitting: Studies have shown that these 5 steps will help you quit smoking and quit for good. You have the best chances of quitting if you use them together:   1. GET READY  Set a quit date.  Change your environment.  Get rid of ALL cigarettes, ashtrays, matches, and lighters in your home, car, and place of work.  Do not let people smoke in your home.  Review your past attempts to quit. Think about what worked and what did not.  Once you quit, do not smoke. NOT EVEN A PUFF!   2. GET SUPPORT AND ENCOURAGEMENT  Tell your  family, friends, and coworkers that you are going to quit and need their support. Ask them not to smoke around you.  Get individual, group, or telephone counseling and support.  Many smokers find one or more of the many self-help books available useful in helping them quit and stay off tobacco.   3. LEARN NEW SKILLS AND BEHAVIORS  Try to distract yourself from urges to smoke. Talk to someone, go for a walk, or occupy your time with a task.  When you first try to quit, change your routine. Take a different route to work. Drink tea instead of coffee. Eat breakfast in a different place.  Do something to reduce your stress. Take a hot bath, exercise, or read a book.  Plan something enjoyable to do every day. Reward yourself for not smoking.  Explore interactive web-based programs that specialize in helping you quit.   4. GET MEDICINE AND USE IT CORRECTLY .  Medicines can help you stop smoking and decrease the urge to smoke. Combining medicine with the above behavioral methods and support can quadruple your chances of successfully quitting smoking.  Talk with your doctor about these options.  5. BE PREPARED FOR RELAPSE  OR DIFFICULT SITUATIONS  Most relapses occur within the first 3 months after quitting. Do not be discouraged if you start smoking again. Remember, most people try several times before they finally quit.  You may have symptoms of withdrawal because your body is used to nicotine. You may crave cigarettes, be irritable, feel very hungry, cough often, get headaches, or have difficulty concentrating.  The withdrawal symptoms are only temporary. They are strongest when you first quit, but they will go away within 10 to 14 days.   Quitting takes hard work and a lot of effort, but you can quit smoking.   FOR MORE INFORMATION  Smokefree.gov (Inrails.tn) provides free, accurate, evidence-based information and professional assistance to help support the immediate and long-term  needs of people trying to quit smoking.  Document Released: 04/08/2001 Document Re-Released: 10/02/2009  [facility] Patient Information 2011 [place].

## 2013-03-18 NOTE — Progress Notes (Signed)
  Subjective:    Patient ID: Jared Woodward, male    DOB: 1940/11/09, 72 y.o.   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 Alen Blew, hypertension, CKD stage III who presents for evaluation of his hypertension. He is accompanied by his cousin who is his main support figure given his MMR. He brings all his medication and that was reviewed with the patient as in the chart. He has no symptoms of dizziness, syncope, chest pain, shortness of breath, palpitations, or lower extremity edema. He is otherwise feeling well and has no other complaints.  Patient's tremor is well controlled with propranolol.   He has chronic left shoulder pain after trying accident about 8 years ago that is well-controlled with Tylenol.she has not noticed any new weakness or new increase or exacerbation of chronic shoulder pain. Review of Systems Otherwise negative unless listed in history of present illness    Objective:   Physical Exam  Constitutional: He appears well-developed and well-nourished.  Cardiovascular: Normal rate.   Murmur (systolic) heard. Pulmonary/Chest: Effort normal and breath sounds normal. No respiratory distress. He has no wheezes. He has no rales.  Abdominal: Soft. Bowel sounds are normal. There is no tenderness.  Musculoskeletal: Normal range of motion. He exhibits no edema and no tenderness.    Filed Vitals:   03/18/13 1321  BP: 128/77  Pulse: 63  Temp: 96.8 F (36 C)         Assessment & Plan:  1. HTN: well- controlled. Will continue current medication BP Readings from Last 3 Encounters:  03/18/13 128/77  01/26/13 112/71  12/31/12 119/66   2. Health maintenance: zoster vaccine   3. Chronic shoulder pain: patient's primary caregiver states that sometimes Tylenol is not enough for the patient and this has been an ongoing problem and would like to retry some tramadol to help with symptoms.  Patient does not have any warning symptoms or significant neurological or musculoskeletal deficits on physical exam today. -Tramadol  4. CKD stage III:  per primary caregiver patient has had a recent evaluation and follows up with [facility].  Pt was discussed with Dr. Eppie Gibson

## 2013-03-19 NOTE — Progress Notes (Signed)
Case discussed with Dr. Algis Liming soon after the resident saw the patient.  We reviewed the resident's history and exam and pertinent patient test results.  I agree with the assessment, diagnosis and plan of care documented in the resident's note.

## 2013-03-22 LAB — VITAMIN D 1,25 DIHYDROXY: Vitamin D 1, 25 (OH)2 Total: 58 pg/mL (ref 18–72)

## 2013-05-09 ENCOUNTER — Other Ambulatory Visit: Payer: Self-pay | Admitting: *Deleted

## 2013-05-09 DIAGNOSIS — G250 Essential tremor: Secondary | ICD-10-CM

## 2013-05-10 MED ORDER — PROPRANOLOL HCL 40 MG PO TABS: 40.0000 mg | ORAL_TABLET | Freq: Two times a day (BID) | ORAL | Status: DC

## 2013-09-16 ENCOUNTER — Encounter: Payer: Self-pay | Admitting: Internal Medicine

## 2013-09-16 ENCOUNTER — Ambulatory Visit (INDEPENDENT_AMBULATORY_CARE_PROVIDER_SITE_OTHER): Payer: Medicare Other | Admitting: Internal Medicine

## 2013-09-16 VITALS — BP 118/77 | HR 57 | Temp 97.2°F | Ht 68.0 in | Wt 113.4 lb

## 2013-09-16 DIAGNOSIS — I10 Essential (primary) hypertension: Secondary | ICD-10-CM | POA: Diagnosis not present

## 2013-09-16 DIAGNOSIS — M1710 Unilateral primary osteoarthritis, unspecified knee: Secondary | ICD-10-CM

## 2013-09-16 DIAGNOSIS — M170 Bilateral primary osteoarthritis of knee: Secondary | ICD-10-CM

## 2013-09-16 DIAGNOSIS — IMO0002 Reserved for concepts with insufficient information to code with codable children: Secondary | ICD-10-CM | POA: Diagnosis present

## 2013-09-16 NOTE — Patient Instructions (Signed)
Thank you for bringing your medicines today. This helps [place] keep you safe from mistakes.  It was nice to see you today and glad you are doing well.   Some things to try for your arthritis:  - glucosamine tablets -heat -tylenol before exercising Osteoarthritis Osteoarthritis is a disease that causes soreness and swelling (inflammation) of a joint. It occurs when the cartilage at the affected joint wears down. Cartilage acts as a cushion, covering the ends of bones where they meet to form a joint. Osteoarthritis is the most common form of arthritis. It often occurs in older people. The joints affected most often by this condition include those in the:  Ends of the fingers.  Thumbs.  Neck.  Lower back.  Knees.  Hips. CAUSES  Over time, the cartilage that covers the ends of bones begins to wear away. This causes bone to rub on bone, producing pain and stiffness in the affected joints.  RISK FACTORS Certain factors can increase your chances of having osteoarthritis, including:  Older age.  Excessive body weight.  Overuse of joints. SIGNS AND SYMPTOMS   Pain, swelling, and stiffness in the joint.  Over time, the joint may lose its normal shape.  Small deposits of bone (osteophytes) may grow on the edges of the joint.  Bits of bone or cartilage can break off and float inside the joint space. This may cause more pain and damage. DIAGNOSIS  Your health care provider will do a physical exam and ask about your symptoms. Various tests may be ordered, such as:  X-rays of the affected joint.  An MRI scan.  Blood tests to rule out other types of arthritis.  Joint fluid tests. This involves using a needle to draw fluid from the joint and examining the fluid under a microscope. TREATMENT  Goals of treatment are to control pain and improve joint function. Treatment plans may include:  A prescribed exercise program that allows for rest and joint relief.  A weight control  plan.  Pain relief techniques, such as:  Properly applied heat and cold.  Electric pulses delivered to nerve endings under the skin (transcutaneous electrical nerve stimulation, TENS).  Massage.  Certain nutritional supplements.  Medicines to control pain, such as:  Acetaminophen.  Nonsteroidal anti-inflammatory drugs (NSAIDs), such as naproxen.  Narcotic or central-acting agents, such as tramadol.  Corticosteroids. These can be given orally or as an injection.  Surgery to reposition the bones and relieve pain (osteotomy) or to remove loose pieces of bone and cartilage. Joint replacement may be needed in advanced states of osteoarthritis. HOME CARE INSTRUCTIONS   Only take over-the-counter or prescription medicines as directed by your health care provider. Take all medicines exactly as instructed.  Maintain a healthy weight. Follow your health care provider's instructions for weight control. This may include dietary instructions.  Exercise as directed. Your health care provider can recommend specific types of exercise. These may include:  Strengthening exercises These are done to strengthen the muscles that support joints affected by arthritis. They can be performed with weights or with exercise bands to add resistance.  Aerobic activities These are exercises, such as brisk walking or low-impact aerobics, that get your heart pumping.  Range-of-motion activities These keep your joints limber.  Balance and agility exercises These help you maintain daily living skills.  Rest your affected joints as directed by your health care provider.  Follow up with your health care provider as directed. SEEK MEDICAL CARE IF:   Your skin turns red.  You develop a rash in addition to your joint pain.  You have worsening joint pain. SEEK IMMEDIATE MEDICAL CARE IF:  You have a significant loss of weight or appetite.  You have a fever along with joint or muscle aches.  You have  night sweats. [place] of Arthritis and Musculoskeletal and Skin Diseases: www.niams.SouthExposed.es [employer] on Aging: http://kim-miller.com/ American College of Rheumatology: www.rheumatology.org Document Released: 04/14/2005 Document Revised: 02/02/2013 Document Reviewed: 12/20/2012 [facility] Patient Information 2014 [place], [place].

## 2013-09-16 NOTE — Progress Notes (Signed)
   Subjective:    Patient ID: BEKA MIKULAK, male    DOB: 30-Dec-1940, 73 y.o.   MRN: [id number]  HPI Mr. Nigg is a 73yo M pmh as listed below presents for knee pain while riding a bike.   He complains of bilateral knee pain that is ache in nature and happens when he is going up hills but sometimes when he walks or after a long day or first thing in the morning. He describes some stiffness less than 5 min in the morning that is relieved by movement. The ache relieved by asa cream and tylenol. He denied any trauma to the knee, had no trouble with ambulation, no limitation by ache for ADLs, no warm/hot/swollen joints, no migrating joint pain, no other joints that are stiff or swollen at anytime, no fevers or chills.   In terms of his other issues he is taking his medications without difficulty and having no side effects. He doesn't smoke and is supported by his cousin. Pt feels happy and content.   Review of Systems  Constitutional: Negative for fever, activity change, fatigue and unexpected weight change.  Respiratory: Negative for chest tightness and shortness of breath.   Cardiovascular: Negative for chest pain, palpitations and leg swelling.  Gastrointestinal: Negative for nausea, vomiting, abdominal pain, diarrhea, constipation and blood in stool.  Musculoskeletal: Positive for arthralgias (bilateral knee pain). Negative for back pain, gait problem, joint swelling, myalgias, neck pain and neck stiffness.  Skin: Negative for rash and wound.  Neurological: Negative for weakness.       Objective:   Physical Exam Filed Vitals:   09/16/13 1535  BP: 118/77  Pulse: 57  Temp: 97.2 F (36.2 C)   General: sitting in chair, very pleasant Cardiac: RRR, no rubs, murmurs or gallops Pulm: clear to auscultation bilaterally, moving normal volumes of air Abd: soft, nontender, nondistended, BS present Ext: warm and well perfused, no pedal edema MSK: knees FROM with some palpable crepitus, no  effusions or erythema, negative valgus and varus testing, normal ambulation, 5/5 LE strength Neuro: alert and oriented X3, cranial nerves II-XII grossly intact    Assessment & Plan:  Please see problem oriented charting  Pt discussed with Dr. Stann Mainland

## 2013-09-17 DIAGNOSIS — M170 Bilateral primary osteoarthritis of knee: Secondary | ICD-10-CM | POA: Insufficient documentation

## 2013-09-17 NOTE — Assessment & Plan Note (Signed)
BP Readings from Last 3 Encounters:  09/16/13 118/77  03/18/13 128/77  01/26/13 112/71   Pt is maintaining excellent control without complications or orthostasis. Will continue current medications.

## 2013-09-17 NOTE — Assessment & Plan Note (Signed)
Pt seems to have classic symptoms of OA. No other concern for infection or rheumatologic arthritis at this time. Pt not having limited mobility or warning signs to warrant emergent imaging.  -cont symptomatic management with tylenol, exercise, and ASA topical cream -information on OA given -pt wanted to try glucosamine tablets

## 2013-09-20 NOTE — Progress Notes (Signed)
Case discussed with Dr. Sadek soon after the resident saw the patient.  We reviewed the resident's history and exam and pertinent patient test results.  I agree with the assessment, diagnosis, and plan of care documented in the resident's note. 

## 2013-10-07 ENCOUNTER — Other Ambulatory Visit: Payer: Self-pay | Admitting: Internal Medicine

## 2013-11-19 ENCOUNTER — Telehealth: Payer: Self-pay | Admitting: Oncology

## 2013-11-19 NOTE — Telephone Encounter (Signed)
due to FS PAL moved 9/4 appt to 9/10. lmonvm for pt and mailed schedule.

## 2013-12-30 ENCOUNTER — Other Ambulatory Visit: Payer: Medicare Other

## 2013-12-30 ENCOUNTER — Ambulatory Visit: Payer: Medicare Other | Admitting: Oncology

## 2014-01-04 ENCOUNTER — Other Ambulatory Visit: Payer: Self-pay | Admitting: Oncology

## 2014-01-04 DIAGNOSIS — D472 Monoclonal gammopathy: Secondary | ICD-10-CM

## 2014-01-05 ENCOUNTER — Telehealth: Payer: Self-pay | Admitting: Oncology

## 2014-01-05 ENCOUNTER — Encounter: Payer: Self-pay | Admitting: Oncology

## 2014-01-05 ENCOUNTER — Other Ambulatory Visit (HOSPITAL_BASED_OUTPATIENT_CLINIC_OR_DEPARTMENT_OTHER): Payer: PRIVATE HEALTH INSURANCE

## 2014-01-05 ENCOUNTER — Ambulatory Visit (HOSPITAL_BASED_OUTPATIENT_CLINIC_OR_DEPARTMENT_OTHER): Payer: PRIVATE HEALTH INSURANCE | Admitting: Oncology

## 2014-01-05 VITALS — BP 120/68 | HR 58 | Temp 98.4°F | Resp 19 | Ht 68.0 in | Wt 114.0 lb

## 2014-01-05 DIAGNOSIS — N289 Disorder of kidney and ureter, unspecified: Secondary | ICD-10-CM

## 2014-01-05 DIAGNOSIS — D649 Anemia, unspecified: Secondary | ICD-10-CM

## 2014-01-05 DIAGNOSIS — D472 Monoclonal gammopathy: Secondary | ICD-10-CM

## 2014-01-05 LAB — CBC WITH DIFFERENTIAL/PLATELET
BASO%: 0.3 % (ref 0.0–2.0)
Basophils Absolute: 0 10*3/uL (ref 0.0–0.1)
EOS%: 2 % (ref 0.0–7.0)
Eosinophils Absolute: 0.1 10*3/uL (ref 0.0–0.5)
HCT: 29.7 % — ABNORMAL LOW (ref 38.4–49.9)
HGB: 9.7 g/dL — ABNORMAL LOW (ref 13.0–17.1)
LYMPH%: 40.1 % (ref 14.0–49.0)
MCH: 32.2 pg (ref 27.2–33.4)
MCHC: 32.8 g/dL (ref 32.0–36.0)
MCV: 98.1 fL — ABNORMAL HIGH (ref 79.3–98.0)
MONO#: 0.2 10*3/uL (ref 0.1–0.9)
MONO%: 5.3 % (ref 0.0–14.0)
NEUT#: 1.8 10*3/uL (ref 1.5–6.5)
NEUT%: 52.3 % (ref 39.0–75.0)
Platelets: 192 10*3/uL (ref 140–400)
RBC: 3.02 10*6/uL — ABNORMAL LOW (ref 4.20–5.82)
RDW: 13.2 % (ref 11.0–14.6)
WBC: 3.4 10*3/uL — ABNORMAL LOW (ref 4.0–10.3)
lymph#: 1.4 10*3/uL (ref 0.9–3.3)

## 2014-01-05 LAB — COMPREHENSIVE METABOLIC PANEL (CC13)
ALT: 8 U/L (ref 0–55)
AST: 13 U/L (ref 5–34)
Albumin: 3.2 g/dL — ABNORMAL LOW (ref 3.5–5.0)
Alkaline Phosphatase: 50 U/L (ref 40–150)
Anion Gap: 7 mEq/L (ref 3–11)
BUN: 25.1 mg/dL (ref 7.0–26.0)
CO2: 29 mEq/L (ref 22–29)
Calcium: 8.8 mg/dL (ref 8.4–10.4)
Chloride: 105 mEq/L (ref 98–109)
Creatinine: 2.4 mg/dL — ABNORMAL HIGH (ref 0.7–1.3)
Glucose: 113 mg/dl (ref 70–140)
Potassium: 3.6 mEq/L (ref 3.5–5.1)
Sodium: 142 mEq/L (ref 136–145)
Total Bilirubin: 0.49 mg/dL (ref 0.20–1.20)
Total Protein: 6 g/dL — ABNORMAL LOW (ref 6.4–8.3)

## 2014-01-05 NOTE — Telephone Encounter (Signed)
, °

## 2014-01-05 NOTE — Progress Notes (Signed)
Patient requesting to know how to "gain some weight." States he eats but is not gaining weight on. States he would like to speak with a nutritionist if possible.  Informed Dr. Alen Blew of patient's request. LVMOM with Ernestene Kiel, nutritionist to call patient.

## 2014-01-05 NOTE — Progress Notes (Signed)
Hematology and Oncology Follow Up Visit  Jared Woodward [id number] 1941/01/14 73 y.o. 01/05/2014 3:23 PM Jared Woodward, MDSadek, Jared Sierras, MD   Principle Diagnosis: This is a 73 year-old gentleman with monoclonal gammopathy of undetermined significance, rule out plasma cell disorder, diagnosed in 2009.  At that time had presented with an IgA kappa subtype.Skeletal survey was unremarkable and was done in June 2009 which showed a 5.6 mm lucent area which was probably a venous lake rather than any lytic bony lesion. This was repeated in 2012 and unchanged. His protein studies in 2009 showed that he had an M spike of 0.29 g/dL, slightly elevated IgA subtype at 582, he had normal kappa and lambda free light chain, and immunofixation showed an IgA lambda.   Current therapy: Observation and surveillance.  Interim History:  [name] presents today for a followup visit. This gentleman has a longstanding history of hypertension and chronic renal insufficiency related to that.  He also was found to have a monoclonal protein, indicating possible plasma cell disorder.  Since his last visit, really no major changes in his health. He has not reported any pathological fractures or any recurrent infections. He does report weight loss but his appetite has been reasonable. He continues to have decline in his renal function but have been following up with nephrology and really no new complaints. Is not reported headaches or blurred vision. Has not reported any syncope or seizures. He does not report any chest pain or difficulty breathing. Does not report any nausea or vomiting or abdominal pain. Does not report any frequency urgency or hesitancy. Rest of his review of systems unremarkable.  Medications: I have reviewed the patient's current medications.  Current Outpatient Prescriptions  Medication Sig Dispense Refill  . albuterol (PROVENTIL HFA) 108 (90 BASE) MCG/ACT inhaler Inhale 2 puffs into the lungs every 6 (six)  hours as needed for wheezing.  1 Inhaler  2  . amLODipine (NORVASC) 5 MG tablet Take 1 tablet (5 mg total) by mouth daily.  90 tablet  11  . aspirin 81 MG tablet Take 81 mg by mouth daily.      . chlorthalidone (HYGROTON) 25 MG tablet Take 1 tablet (25 mg total) by mouth daily.  90 tablet  2  . diclofenac sodium (VOLTAREN) 1 % GEL Apply a small amount to shoulder or ankle twice daily  100 g  0  . dorzolamide (TRUSOPT) 2 % ophthalmic solution Place 1 drop into both eyes 2 (two) times daily.  10 mL  4  . FLUVIRIN INJ injection       . propranolol (INDERAL) 40 MG tablet Take 1 tablet (40 mg total) by mouth 2 (two) times daily.  180 tablet  1  . thiamine 250 MG tablet Take 250 mg by mouth daily.      . TRAVATAN Z 0.004 % SOLN ophthalmic solution       . vitamin B-12 (CYANOCOBALAMIN) 1000 MCG tablet 500 mcg 2 (two) times daily. Take 2 tablet daily per mouth.      . Vitamin D, Ergocalciferol, (DRISDOL) 50000 UNITS CAPS Take 1 capsule (50,000 Units total) by mouth every 7 (seven) days.  4 capsule  3   No current facility-administered medications for this visit.     Allergies:  Allergies  Allergen Reactions  . Lisinopril Cough    Past Medical History, Surgical history, Social history, and Family History were reviewed and updated.  Review of Systems:  Remaining ROS negative. Physical Exam: Blood pressure 120/68,  pulse 58, temperature 98.4 F (36.9 C), temperature source Oral, resp. rate 19, height 5' 8"  (1.727 m), weight 114 lb (51.71 kg). ECOG: 1 General appearance: alert Head: Normocephalic, without obvious abnormality, atraumatic Neck: no adenopathy. Lymph nodes: Cervical, supraclavicular, and axillary nodes normal. Heart:regular rate and rhythm, S1, S2 normal, no murmur, click, rub or gallop Lung:chest clear, no wheezing, rales, normal symmetric air entry Abdomin: soft, non-tender, without masses or organomegaly EXT:no erythema, induration, or nodules   Lab Results: Lab Results   Component Value Date   WBC 3.4* 01/05/2014   HGB 9.7* 01/05/2014   HCT 29.7* 01/05/2014   MCV 98.1* 01/05/2014   PLT 192 01/05/2014     Chemistry      Component Value Date/Time   NA 141 12/31/2012 1041   NA 143 11/18/2012 1031   K 4.0 12/31/2012 1041   K 4.3 11/18/2012 1031   CL 104 11/18/2012 1031   CO2 27 12/31/2012 1041   CO2 31 11/18/2012 1031   BUN 35.6* 12/31/2012 1041   BUN 29* 11/18/2012 1031   CREATININE 2.4* 12/31/2012 1041   CREATININE 2.26* 11/18/2012 1031   CREATININE 1.92* 02/27/2011 0950      Component Value Date/Time   CALCIUM 9.2 12/31/2012 1041   CALCIUM 9.2 11/18/2012 1031   ALKPHOS 57 12/31/2012 1041   ALKPHOS 67 08/31/2012 1145   AST 14 12/31/2012 1041   AST 12 08/31/2012 1145   ALT 10 12/31/2012 1041   ALT <8 08/31/2012 1145   BILITOT 0.46 12/31/2012 1041   BILITOT 0.3 08/31/2012 1145      Impression and Plan:  This is a pleasant 73 year old gentleman with the following issues: 1. IgA lambda monoclonal protein, most likely represents a monoclonal gammopathy of undetermined significance, rule out plasma cell disorder or multiple myeloma.  His protein studies have not really changed over 5 years, which make it less likely a multiple myeloma and make it more likely to be  monoclonal gammopathy of undetermined significance.  However, given the fact that an IgA subtype is at high risk of progressing into multiple myeloma. His protein studies have been repeated today and will be repeated annually. 2. Renal insufficiency and mild anemia.  I think they are all related to longstanding hypertension.  I do not think there is an element of a plasma cell disorder at this time. 3. Follow up: in 12 months.   Zola Button, MD 9/10/20153:23 PM

## 2014-01-06 ENCOUNTER — Encounter: Payer: Self-pay | Admitting: Nutrition

## 2014-01-06 ENCOUNTER — Telehealth: Payer: Self-pay | Admitting: Nutrition

## 2014-01-06 NOTE — Progress Notes (Signed)
Patient's cousin called and cancelled nutrition appointment for Monday, Sept 14.  He does not have transportation.  She did not want to reschedule.

## 2014-01-06 NOTE — Telephone Encounter (Signed)
Lft msg for pt advising of nut w/BN for 09/14 at10:30am...Marland KitchenMarland KitchenKJ

## 2014-01-09 ENCOUNTER — Encounter: Payer: Medicare Other | Admitting: Nutrition

## 2014-01-09 LAB — SPEP & IFE WITH QIG
ALBUMIN ELP: 60.4 % (ref 55.8–66.1)
ALPHA-1-GLOBULIN: 3.8 % (ref 2.9–4.9)
Alpha-2-Globulin: 9.5 % (ref 7.1–11.8)
BETA 2: 9.9 % — AB (ref 3.2–6.5)
Beta Globulin: 5.5 % (ref 4.7–7.2)
Gamma Globulin: 10.9 % — ABNORMAL LOW (ref 11.1–18.8)
IGA: 395 mg/dL — AB (ref 68–379)
IGM, SERUM: 17 mg/dL — AB (ref 41–251)
IgG (Immunoglobin G), Serum: 710 mg/dL (ref 650–1600)
M-Spike, %: 0.2 g/dL
TOTAL PROTEIN, SERUM ELECTROPHOR: 5.5 g/dL — AB (ref 6.0–8.3)

## 2014-01-09 LAB — KAPPA/LAMBDA LIGHT CHAINS
Kappa free light chain: 1.42 mg/dL (ref 0.33–1.94)
Kappa:Lambda Ratio: 0.74 (ref 0.26–1.65)
LAMBDA FREE LGHT CHN: 1.91 mg/dL (ref 0.57–2.63)

## 2014-01-12 ENCOUNTER — Ambulatory Visit (INDEPENDENT_AMBULATORY_CARE_PROVIDER_SITE_OTHER): Payer: Medicare Other | Admitting: *Deleted

## 2014-01-12 DIAGNOSIS — Z23 Encounter for immunization: Secondary | ICD-10-CM

## 2014-04-13 ENCOUNTER — Other Ambulatory Visit: Payer: Self-pay | Admitting: Internal Medicine

## 2014-05-22 ENCOUNTER — Other Ambulatory Visit: Payer: Self-pay | Admitting: Internal Medicine

## 2014-05-25 ENCOUNTER — Other Ambulatory Visit: Payer: Self-pay | Admitting: Internal Medicine

## 2014-06-13 ENCOUNTER — Other Ambulatory Visit: Payer: Self-pay | Admitting: Internal Medicine

## 2014-07-07 ENCOUNTER — Other Ambulatory Visit: Payer: Self-pay | Admitting: Internal Medicine

## 2014-09-06 ENCOUNTER — Encounter: Payer: Self-pay | Admitting: *Deleted

## 2014-10-04 ENCOUNTER — Other Ambulatory Visit: Payer: Self-pay | Admitting: Internal Medicine

## 2014-10-23 ENCOUNTER — Other Ambulatory Visit: Payer: Self-pay

## 2014-11-16 DIAGNOSIS — H4011X3 Primary open-angle glaucoma, severe stage: Secondary | ICD-10-CM | POA: Diagnosis not present

## 2014-11-16 DIAGNOSIS — Z961 Presence of intraocular lens: Secondary | ICD-10-CM | POA: Diagnosis not present

## 2014-11-16 DIAGNOSIS — H4011X2 Primary open-angle glaucoma, moderate stage: Secondary | ICD-10-CM | POA: Diagnosis not present

## 2014-11-27 ENCOUNTER — Other Ambulatory Visit: Payer: Self-pay | Admitting: Internal Medicine

## 2014-12-05 NOTE — Telephone Encounter (Signed)
Pt aware.

## 2014-12-08 DIAGNOSIS — D649 Anemia, unspecified: Secondary | ICD-10-CM | POA: Diagnosis not present

## 2014-12-08 DIAGNOSIS — N184 Chronic kidney disease, stage 4 (severe): Secondary | ICD-10-CM | POA: Diagnosis not present

## 2014-12-08 DIAGNOSIS — I10 Essential (primary) hypertension: Secondary | ICD-10-CM | POA: Diagnosis not present

## 2014-12-08 DIAGNOSIS — R252 Cramp and spasm: Secondary | ICD-10-CM | POA: Diagnosis not present

## 2014-12-12 ENCOUNTER — Emergency Department (INDEPENDENT_AMBULATORY_CARE_PROVIDER_SITE_OTHER)
Admission: EM | Admit: 2014-12-12 | Discharge: 2014-12-12 | Disposition: A | Discharge status: Home / Self Care | Payer: Self-pay | Source: Home / Self Care

## 2014-12-12 ENCOUNTER — Encounter (HOSPITAL_COMMUNITY): Payer: Self-pay | Admitting: Emergency Medicine

## 2014-12-12 DIAGNOSIS — N183 Chronic kidney disease, stage 3 unspecified: Secondary | ICD-10-CM

## 2014-12-12 DIAGNOSIS — N390 Urinary tract infection, site not specified: Secondary | ICD-10-CM | POA: Diagnosis not present

## 2014-12-12 DIAGNOSIS — R319 Hematuria, unspecified: Secondary | ICD-10-CM | POA: Diagnosis not present

## 2014-12-12 LAB — POCT I-STAT, CHEM 8
BUN: 32 mg/dL — ABNORMAL HIGH (ref 6–20)
CALCIUM ION: 1.27 mmol/L (ref 1.13–1.30)
CHLORIDE: 99 mmol/L — AB (ref 101–111)
Creatinine, Ser: 2.7 mg/dL — ABNORMAL HIGH (ref 0.61–1.24)
Glucose, Bld: 102 mg/dL — ABNORMAL HIGH (ref 65–99)
HCT: 35 % — ABNORMAL LOW (ref 39.0–52.0)
Hemoglobin: 11.9 g/dL — ABNORMAL LOW (ref 13.0–17.0)
Potassium: 4.7 mmol/L (ref 3.5–5.1)
SODIUM: 137 mmol/L (ref 135–145)
TCO2: 27 mmol/L (ref 0–100)

## 2014-12-12 LAB — POCT URINALYSIS DIP (DEVICE)
BILIRUBIN URINE: NEGATIVE
GLUCOSE, UA: NEGATIVE mg/dL
Ketones, ur: NEGATIVE mg/dL
NITRITE: NEGATIVE
PH: 5.5 (ref 5.0–8.0)
Protein, ur: 30 mg/dL — AB
Specific Gravity, Urine: 1.025 (ref 1.005–1.030)
Urobilinogen, UA: 0.2 mg/dL (ref 0.0–1.0)

## 2014-12-12 MED ORDER — CIPROFLOXACIN HCL 250 MG PO TABS: 250.0000 mg | ORAL_TABLET | Freq: Two times a day (BID) | ORAL | Status: DC

## 2014-12-12 MED ORDER — PHENAZOPYRIDINE HCL 200 MG PO TABS: 200.0000 mg | ORAL_TABLET | Freq: Three times a day (TID) | ORAL | Status: DC

## 2014-12-12 NOTE — Discharge Instructions (Signed)
Chronic Kidney Disease °Chronic kidney disease occurs when the kidneys are damaged over a long period. The kidneys are two organs that lie on either side of the spine between the middle of the back and the front of the abdomen. The kidneys:  °· Remove wastes and extra water from the blood.   °· Produce important hormones. These help keep bones strong, regulate blood pressure, and help create red blood cells.   °· Balance the fluids and chemicals in the blood and tissues. °A small amount of kidney damage may not cause problems, but a large amount of damage may make it difficult or impossible for the kidneys to work the way they should. If steps are not taken to slow down the kidney damage or stop it from getting worse, the kidneys may stop working permanently. Most of the time, chronic kidney disease does not go away. However, it can often be controlled, and those with the disease can usually live normal lives. °CAUSES  °The most common causes of chronic kidney disease are diabetes and high blood pressure (hypertension). Chronic kidney disease may also be caused by:  °· Diseases that cause the kidneys' filters to become inflamed.   °· Diseases that affect the immune system.   °· Genetic diseases.   °· Medicines that damage the kidneys, such as anti-inflammatory medicines.   °· Poisoning or exposure to toxic substances.   °· A reoccurring kidney or urinary infection.   °· A problem with urine flow. This may be caused by:   °¨ Cancer.   °¨ Kidney stones.   °¨ An enlarged prostate in males. °SIGNS AND SYMPTOMS  °Because the kidney damage in chronic kidney disease occurs slowly, symptoms develop slowly and may not be obvious until the kidney damage becomes severe. A person may have a kidney disease for years without showing any symptoms. Symptoms can include:  °· Swelling (edema) of the legs, ankles, or feet.   °· Tiredness (lethargy).   °· Nausea or vomiting.   °· Confusion.   °· Problems with urination, such as:    °¨ Decreased urine production.   °¨ Frequent urination, especially at night.   °¨ Frequent accidents in children who are potty trained.   °· Muscle twitches and cramps.   °· Shortness of breath.  °· Weakness.   °· Persistent itchiness.   °· Loss of appetite. °· Metallic taste in the mouth. °· Trouble sleeping. °· Slowed development in children. °· Short stature in children. °DIAGNOSIS  °Chronic kidney disease may be detected and diagnosed by tests, including blood, urine, imaging, or kidney biopsy tests.  °TREATMENT  °Most chronic kidney diseases cannot be cured. Treatment usually involves relieving symptoms and preventing or slowing the progression of the disease. Treatment may include:  °· A special diet. You may need to avoid alcohol and foods that are salty and high in potassium.   °· Medicines. These may:   °¨ Lower blood pressure.   °¨ Relieve anemia.   °¨ Relieve swelling.   °¨ Protect the bones. °HOME CARE INSTRUCTIONS  °· Follow your prescribed diet.   °· Take medicines only as directed by your health care provider. Do not take any new medicines (prescription, over-the-counter, or nutritional supplements) unless approved by your health care provider. Many medicines can worsen your kidney damage or need to have the dose adjusted.   °· Quit smoking if you smoke. Talk to your health care provider about a smoking cessation program.   °· Keep all follow-up visits as directed by your health care provider. °SEEK IMMEDIATE MEDICAL CARE IF: °· Your symptoms get worse or you develop new symptoms.   °· You develop symptoms of end-stage kidney disease. These   include:   Headaches.   Abnormally dark or light skin.   Numbness in the hands or feet.   Easy bruising.   Frequent hiccups.   Menstruation stops.   You have a fever.   You have decreased urine production.   You havepain or bleeding when urinating. MAKE SURE YOU:  Understand these instructions.  Will watch your condition.  Will  get help right away if you are not doing well or get worse. FOR MORE INFORMATION   American Association of Kidney Patients: BombTimer.gl  National Kidney Foundation: www.kidney.[place]: https://mathis.com/  [facility]: www.lifeoptions.org and www.kidneyschool.org Document Released: 01/22/2008 Document Revised: 08/29/2013 Document Reviewed: 12/12/2011 [facility] Patient Information 2015 [place], [place]. This information is not intended to replace advice given to you by your health care provider. Make sure you discuss any questions you have with your health care provider.   Drink plenty of water. Take meds as directed. Follow up with PCP for recheck after completing antibiotic, sooner if worse. Also follow up with labs as your kidney function is worsening-call PCP for further management.

## 2014-12-12 NOTE — ED Provider Notes (Signed)
CSN: [id number]     Arrival date & time 12/12/14  1713 History   None    Chief Complaint  Patient presents with  . Urinary Tract Infection   (Consider location/radiation/quality/duration/timing/severity/associated sxs/prior Treatment) HPI Comments: Pt has hx of CKD, not sexually active per pt report, pt has mild MR.  Patient is a 74 y.o. male presenting with dysuria. The history is provided by the patient. No language interpreter was used.  Dysuria This is a new problem. The current episode started 2 days ago. The problem occurs constantly. The problem has not changed since onset.Pertinent negatives include no chest pain, no abdominal pain, no headaches and no shortness of breath. Exacerbated by: urination. Nothing relieves the symptoms. Treatments tried: cranberry juice. The treatment provided no relief.    Past Medical History  Diagnosis Date  . 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 ANEMIA OF CHRONIC DISEASE 05/08/2006  . ANEMIA, VITAMIN B12 DEFICIENCY NEC 01/12/2007  . GLAUCOMA 05/08/2006  . AMAUROSIS FUGAX 12/24/2007  . HYPERTENSION 05/08/2006  . PULMONARY NODULE 10/15/2007  . RENAL INSUFFICIENCY 05/08/2006  . MENTAL RETARDATION 05/08/2006   Past Surgical History  Procedure Laterality Date  . Tonsillectomy  1956   Family History  Problem Relation Age of Onset  . Colon cancer Neg Hx   . Colon polyps Neg Hx   . Stomach cancer Neg Hx   . Rectal cancer Neg Hx    Social History  Substance Use Topics  . Smoking status: Former Smoker -- 0.10 packs/day for 5 years    Types: Cigarettes    Quit date: 05/03/1988  . Smokeless tobacco: Never Used  . Alcohol Use: No    Review of Systems  Constitutional: Negative for fever, chills, activity change and appetite change.  HENT: Negative.   Eyes: Negative.   Respiratory: Negative for shortness of breath.   Cardiovascular: Negative for chest pain.  Gastrointestinal: Negative for abdominal pain.  Genitourinary: Positive for dysuria.  Negative for urgency, frequency, hematuria, flank pain, decreased urine volume, discharge, penile swelling, scrotal swelling, enuresis, difficulty urinating, genital sores, penile pain and testicular pain.  Musculoskeletal: Negative for back pain.  Allergic/Immunologic: Negative.   Neurological: Negative for headaches.  Hematological: Negative.   Psychiatric/Behavioral: Negative.   All other systems reviewed and are negative.   Allergies  Lisinopril  Home Medications   Prior to Admission medications   Medication Sig Start Date End Date Taking? Authorizing Provider  albuterol (PROVENTIL HFA) 108 (90 BASE) MCG/ACT inhaler Inhale 2 puffs into the lungs every 6 (six) hours as needed for wheezing. 03/18/13 03/18/14  Jerrye Noble, MD  amLODipine (NORVASC) 5 MG tablet TAKE ONE TABLET BY MOUTH ONCE DAILY 05/23/14   Jerrye Noble, MD  amLODipine (NORVASC) 5 MG tablet TAKE ONE TABLET BY MOUTH ONCE DAILY 12/05/14   [name], MD  aspirin 81 MG tablet Take 81 mg by mouth daily.    Historical Provider, MD  chlorthalidone (HYGROTON) 25 MG tablet TAKE ONE TABLET BY MOUTH ONCE DAILY 10/04/14   Jerrye Noble, MD  ciprofloxacin (CIPRO) 250 MG tablet Take 1 tablet (250 mg total) by mouth every 12 (twelve) hours. [id number]   Tori Milks, NP  diclofenac sodium (VOLTAREN) 1 % GEL Apply a small amount to shoulder or ankle twice daily 01/26/13   Harden Mo, MD  dorzolamide (TRUSOPT) 2 % ophthalmic solution Place 1 drop into both eyes 2 (two) times daily. 03/18/13   Jerrye Noble, MD  FLUVIRIN INJ injection  01/24/13  Historical Provider, MD  phenazopyridine (PYRIDIUM) 200 MG tablet Take 1 tablet (200 mg total) by mouth 3 (three) times daily. [id number]   Tori Milks, NP  propranolol (INDERAL) 40 MG tablet TAKE ONE TABLET BY MOUTH TWICE DAILY 10/04/14   Jerrye Noble, MD  thiamine 250 MG tablet Take 250 mg by mouth daily.    Historical Provider, MD  TRAVATAN Z 0.004 % SOLN ophthalmic solution  12/30/12    Historical Provider, MD  vitamin B-12 (CYANOCOBALAMIN) 1000 MCG tablet 500 mcg 2 (two) times daily. Take 2 tablet daily per mouth. 02/10/11   Rosalia Hammers, MD  Vitamin D, Ergocalciferol, (DRISDOL) 50000 UNITS CAPS Take 1 capsule (50,000 Units total) by mouth every 7 (seven) days. 11/18/12   Nino Glow McLean-Scocozza, MD   BP 115/72 mmHg  Pulse 63  Temp(Src) 97.5 F (36.4 C) (Oral)  Resp 16  SpO2 97% Physical Exam  Constitutional: He is oriented to person, place, and time. He appears well-nourished. No distress.  HENT:  Head: Normocephalic.  Eyes: Pupils are equal, round, and reactive to light.  Neck: Normal range of motion.  Cardiovascular: Normal rate and regular rhythm.   Pulmonary/Chest: Effort normal and breath sounds normal.  Abdominal: Soft. Normal appearance and bowel sounds are normal. He exhibits no distension. There is no tenderness.  Musculoskeletal: Normal range of motion.  Neurological: He is alert and oriented to person, place, and time. GCS eye subscore is 4. GCS verbal subscore is 5. GCS motor subscore is 6.  Skin: Skin is warm and dry. He is not diaphoretic.  Psychiatric: He has a normal mood and affect. His speech is normal and behavior is normal.  Nursing note and vitals reviewed.   ED Course  Procedures (including critical care time) Labs Review Labs Reviewed  POCT URINALYSIS DIP (DEVICE) - Abnormal; Notable for the following:    Hgb urine dipstick SMALL (*)    Protein, ur 30 (*)    Leukocytes, UA LARGE (*)    All other components within normal limits  POCT I-STAT, CHEM 8 - Abnormal; Notable for the following:    Chloride 99 (*)    BUN 32 (*)    Creatinine, Ser 2.70 (*)    Glucose, Bld 102 (*)    Hemoglobin 11.9 (*)    HCT 35.0 (*)    All other components within normal limits    Imaging Review No results found.   MDM   1. CKD (chronic kidney disease) stage 3, GFR 30-59 ml/min   2. Urinary tract infection with hematuria, site unspecified     Drink plenty of water. Take meds as directed. Follow up with PCP for recheck after completing antibiotic, sooner if worse. Also follow up with labs as your kidney function is worsening-call PCP for further management. Pt adn caregiver verbalized understanding to this provider.   Tori Milks, NP [phone]

## 2014-12-12 NOTE — ED Notes (Signed)
C/o uti States he has stinging when urinating Used cranberry juice as tx

## 2015-01-09 ENCOUNTER — Other Ambulatory Visit: Payer: Self-pay | Admitting: Oncology

## 2015-01-09 ENCOUNTER — Ambulatory Visit (HOSPITAL_BASED_OUTPATIENT_CLINIC_OR_DEPARTMENT_OTHER): Payer: Medicare Other | Admitting: Oncology

## 2015-01-09 ENCOUNTER — Telehealth: Payer: Self-pay | Admitting: Oncology

## 2015-01-09 ENCOUNTER — Other Ambulatory Visit (HOSPITAL_BASED_OUTPATIENT_CLINIC_OR_DEPARTMENT_OTHER): Payer: Medicare Other

## 2015-01-09 VITALS — BP 125/73 | HR 54 | Temp 97.8°F | Resp 17 | Ht 68.0 in | Wt 108.9 lb

## 2015-01-09 DIAGNOSIS — D649 Anemia, unspecified: Secondary | ICD-10-CM | POA: Diagnosis not present

## 2015-01-09 DIAGNOSIS — N289 Disorder of kidney and ureter, unspecified: Secondary | ICD-10-CM

## 2015-01-09 DIAGNOSIS — D472 Monoclonal gammopathy: Secondary | ICD-10-CM

## 2015-01-09 LAB — CBC WITH DIFFERENTIAL/PLATELET
BASO%: 0.5 % (ref 0.0–2.0)
Basophils Absolute: 0 10*3/uL (ref 0.0–0.1)
EOS ABS: 0.1 10*3/uL (ref 0.0–0.5)
EOS%: 3.4 % (ref 0.0–7.0)
HCT: 29.7 % — ABNORMAL LOW (ref 38.4–49.9)
HEMOGLOBIN: 9.8 g/dL — AB (ref 13.0–17.1)
LYMPH%: 41.5 % (ref 14.0–49.0)
MCH: 31.8 pg (ref 27.2–33.4)
MCHC: 33.1 g/dL (ref 32.0–36.0)
MCV: 95.8 fL (ref 79.3–98.0)
MONO#: 0.2 10*3/uL (ref 0.1–0.9)
MONO%: 6.2 % (ref 0.0–14.0)
NEUT%: 48.4 % (ref 39.0–75.0)
NEUTROS ABS: 1.5 10*3/uL (ref 1.5–6.5)
PLATELETS: 192 10*3/uL (ref 140–400)
RBC: 3.1 10*6/uL — ABNORMAL LOW (ref 4.20–5.82)
RDW: 13.7 % (ref 11.0–14.6)
WBC: 3.2 10*3/uL — AB (ref 4.0–10.3)
lymph#: 1.3 10*3/uL (ref 0.9–3.3)

## 2015-01-09 LAB — COMPREHENSIVE METABOLIC PANEL (CC13)
ALBUMIN: 3.3 g/dL — AB (ref 3.5–5.0)
ALK PHOS: 42 U/L (ref 40–150)
ALT: 7 U/L (ref 0–55)
ANION GAP: 4 meq/L (ref 3–11)
AST: 12 U/L (ref 5–34)
BILIRUBIN TOTAL: 0.27 mg/dL (ref 0.20–1.20)
BUN: 30.7 mg/dL — ABNORMAL HIGH (ref 7.0–26.0)
CO2: 31 mEq/L — ABNORMAL HIGH (ref 22–29)
Calcium: 9.1 mg/dL (ref 8.4–10.4)
Chloride: 107 mEq/L (ref 98–109)
Creatinine: 2.2 mg/dL — ABNORMAL HIGH (ref 0.7–1.3)
EGFR: 33 mL/min/{1.73_m2} — AB (ref >90)
GLUCOSE: 108 mg/dL (ref 70–140)
Potassium: 4.6 mEq/L (ref 3.5–5.1)
Sodium: 142 mEq/L (ref 136–145)
TOTAL PROTEIN: 6 g/dL — AB (ref 6.4–8.3)

## 2015-01-09 NOTE — Telephone Encounter (Signed)
Gave adn printed appt sched and avs fo rpt for sept 2017

## 2015-01-09 NOTE — Progress Notes (Signed)
Hematology and Oncology Follow Up Visit  Jared Woodward [id number] March 14, 1941 74 y.o. 01/09/2015 10:18 AM Jared Woodward, Jared Woodward, Jared Rochester, MD   Principle Diagnosis: This is a 74 year-old gentleman with monoclonal gammopathy of undetermined significance diagnosed in 2009.  At that time had presented with an IgA kappa subtype with an M spike of 0.39 g/dL and an IgA level of 558.Skeletal survey was unremarkable and was done in June 2009 which showed a 5.6 mm lucent area which was probably a venous lake rather than any lytic bony lesion. This was repeated in 2012 and unchanged.   Current therapy: Observation and surveillance.  Interim History:  Jared Woodward presents today for a followup visit. Since his last visit, he reports no major complaints. He has had some slight weight loss over the last 2 years close to 10 pounds. He is using nutritional supplements to boost his weight. He does not report any bone pain or pathological fractures. He does not report any recurrent infections. She was seen in the emergency department last month for a urinary tract infection which have resolved.  He does not report any headaches or blurred vision. Has not reported any syncope or seizures. He does not report any chest pain or difficulty breathing. Does not report any nausea or vomiting or abdominal pain. Does not report any frequency urgency or hesitancy. Rest of his review of systems unremarkable.  Medications: I have reviewed the patient's current medications.  Current Outpatient Prescriptions  Medication Sig Dispense Refill  . amLODipine (NORVASC) 5 MG tablet TAKE ONE TABLET BY MOUTH ONCE DAILY 90 tablet 0  . aspirin 81 MG tablet Take 81 mg by mouth daily.    . chlorthalidone (HYGROTON) 25 MG tablet TAKE ONE TABLET BY MOUTH ONCE DAILY 90 tablet 0  . diclofenac sodium (VOLTAREN) 1 % GEL Apply a small amount to shoulder or ankle twice daily 100 g 0  . dorzolamide (TRUSOPT) 2 % ophthalmic solution Place 1 drop into  both eyes 2 (two) times daily. 10 mL 4  . FLUVIRIN INJ injection     . phenazopyridine (PYRIDIUM) 200 MG tablet Take 1 tablet (200 mg total) by mouth 3 (three) times daily. 6 tablet 0  . propranolol (INDERAL) 40 MG tablet TAKE ONE TABLET BY MOUTH TWICE DAILY 180 tablet 0  . thiamine 250 MG tablet Take 250 mg by mouth daily.    . TRAVATAN Z 0.004 % SOLN ophthalmic solution     . vitamin B-12 (CYANOCOBALAMIN) 1000 MCG tablet 500 mcg 2 (two) times daily. Take 2 tablet daily per mouth.    . Vitamin D, Ergocalciferol, (DRISDOL) 50000 UNITS CAPS Take 1 capsule (50,000 Units total) by mouth every 7 (seven) days. 4 capsule 3  . albuterol (PROVENTIL HFA) 108 (90 BASE) MCG/ACT inhaler Inhale 2 puffs into the lungs every 6 (six) hours as needed for wheezing. 1 Inhaler 2   No current facility-administered medications for this visit.     Allergies:  Allergies  Allergen Reactions  . Lisinopril Cough    Past Medical History, Surgical history, Social history, and Family History were reviewed and updated.   Physical Exam: Blood pressure 125/73, pulse 54, temperature 97.8 F (36.6 C), temperature source Oral, resp. rate 17, height '5\' 8"'  (1.727 m), weight 108 lb 14.4 oz (49.397 kg), SpO2 100 %. ECOG: 1 General appearance: alert overweight gentleman without distress. Head: Normocephalic, without obvious abnormality Neck: no adenopathy. Lymph nodes: Cervical, supraclavicular, and axillary nodes normal. Heart:regular rate and rhythm, S1,  S2 normal, no murmur, click, rub or gallop Lung:chest clear, no wheezing, rales, normal symmetric air entry Abdomin: soft, non-tender, without masses or organomegaly EXT:no erythema, induration, or nodules   Lab Results: Lab Results  Component Value Date   WBC 3.2* 01/09/2015   HGB 9.8* 01/09/2015   HCT 29.7* 01/09/2015   MCV 95.8 01/09/2015   PLT 192 01/09/2015     Chemistry      Component Value Date/Time   NA 142 01/09/2015 0934   NA 137 12/12/2014 1922    K 4.6 01/09/2015 0934   K 4.7 12/12/2014 1922   CL 99* 12/12/2014 1922   CO2 31* 01/09/2015 0934   CO2 31 11/18/2012 1031   BUN 30.7* 01/09/2015 0934   BUN 32* 12/12/2014 1922   CREATININE 2.2* 01/09/2015 0934   CREATININE 2.70* 12/12/2014 1922   CREATININE 2.26* 11/18/2012 1031      Component Value Date/Time   CALCIUM 9.1 01/09/2015 0934   CALCIUM 9.2 11/18/2012 1031   ALKPHOS 42 01/09/2015 0934   ALKPHOS 67 08/31/2012 1145   AST 12 01/09/2015 0934   AST 12 08/31/2012 1145   ALT 7 01/09/2015 0934   ALT <8 08/31/2012 1145   BILITOT 0.27 01/09/2015 0934   BILITOT 0.3 08/31/2012 1145      Results for Jared Woodward, Jared Woodward (MRN [id number]) as of 01/09/2015 09:59  Ref. Range 12/31/2012 10:41 01/05/2014 14:42  M-SPIKE, % Latest Units: g/dL 0.24 0.20  SPE Interp. Unknown * *  IgG (Immunoglobin G), Serum Latest Ref Range: [id number] mg/dL 683 710  IgA Latest Ref Range: 68-379 mg/dL 380 (H) 395 (H)  IgM, Serum Latest Ref Range: 41-251 mg/dL 18 (L) 17 (L)  Total Protein, Serum Electrophoresis Latest Ref Range: 6.0-8.3 g/dL 5.9 (L) 5.5 (L)  Kappa free light chain Latest Ref Range: 0.33-1.94 mg/dL 2.48 (H) 1.42  Lambda Free Lght Chn Latest Ref Range: 0.57-2.63 mg/dL 2.44 1.91  Kappa:Lambda Ratio Latest Ref Range: 0.26-1.65  1.02 0.74    Impression and Plan:  This is a pleasant 74 year old gentleman with the following issues: 1. IgA lambda monoclonal protein, most likely represents a monoclonal gammopathy of undetermined significance, rule out plasma cell disorder or multiple myeloma.  His protein studies from September 2015 were reviewed and compared to previous counts. His M spike have actually declined and his IgA level also declined. This make it less likely we are dealing with multiple myeloma and make it more likely to be  monoclonal gammopathy of undetermined significance.  However, given the fact that an IgA subtype is at high risk of progressing into multiple myeloma. His protein  studies have been repeated today and will be repeated annually. 2. Renal insufficiency and mild anemia.  I think they are all related to longstanding hypertension.  I do not think there is an element of a plasma cell disorder at this time. 3. Follow up: in 12 months.   [facility], MD 9/13/201610:18 AM

## 2015-01-11 LAB — SPEP & IFE WITH QIG
ABNORMAL PROTEIN BAND1: 0.2 g/dL
Albumin ELP: 3.5 g/dL — ABNORMAL LOW (ref 3.8–4.8)
Alpha-1-Globulin: 0.3 g/dL (ref 0.2–0.3)
Alpha-2-Globulin: 0.6 g/dL (ref 0.5–0.9)
Beta 2: 0.6 g/dL — ABNORMAL HIGH (ref 0.2–0.5)
Beta Globulin: 0.3 g/dL — ABNORMAL LOW (ref 0.4–0.6)
Gamma Globulin: 0.7 g/dL — ABNORMAL LOW (ref 0.8–1.7)
IGA: 383 mg/dL — AB (ref 68–379)
IGG (IMMUNOGLOBIN G), SERUM: 839 mg/dL (ref 650–1600)
IGM, SERUM: 27 mg/dL — AB (ref 41–251)
TOTAL PROTEIN, SERUM ELECTROPHOR: 6 g/dL — AB (ref 6.1–8.1)

## 2015-01-11 LAB — KAPPA/LAMBDA LIGHT CHAINS
KAPPA LAMBDA RATIO: 1.19 (ref 0.26–1.65)
Kappa free light chain: 2.51 mg/dL — ABNORMAL HIGH (ref 0.33–1.94)
LAMBDA FREE LGHT CHN: 2.11 mg/dL (ref 0.57–2.63)

## 2015-01-15 ENCOUNTER — Ambulatory Visit (INDEPENDENT_AMBULATORY_CARE_PROVIDER_SITE_OTHER): Payer: Medicare Other | Admitting: Internal Medicine

## 2015-01-15 ENCOUNTER — Encounter: Payer: Self-pay | Admitting: Internal Medicine

## 2015-01-15 VITALS — BP 94/66 | HR 55 | Temp 98.1°F | Ht 68.0 in | Wt 109.5 lb

## 2015-01-15 DIAGNOSIS — M170 Bilateral primary osteoarthritis of knee: Secondary | ICD-10-CM | POA: Diagnosis not present

## 2015-01-15 DIAGNOSIS — I10 Essential (primary) hypertension: Secondary | ICD-10-CM

## 2015-01-15 DIAGNOSIS — D638 Anemia in other chronic diseases classified elsewhere: Secondary | ICD-10-CM | POA: Diagnosis not present

## 2015-01-15 NOTE — Progress Notes (Signed)
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 His BP is 94/66 and PR 55. He denies changes in dietary patterns. He is compliant with his medications (managed by his cousin). He denies syncope or fall.   Dizziness - Do orthostatics. If positive, we can DC Chlorthalidone and reassess in 1 week.  BMP today.   Anemia - Review of labs indicates that HgB dropped from 11.9 to 9.8 in the past month. Colonoscopy 2014 (normal colon, internal hemorroids). Repeat CBC today. Stool cards could be positive due to internal hemorrhoids as well.  Case seen with Dr Melburn Hake. Plan discussed with Dr Melburn Hake. Madilyn Fireman MD MPH 01/15/2015 2:50 PM

## 2015-01-15 NOTE — Assessment & Plan Note (Signed)
He got a CBC last week that showed stable anemia between 9-11 with a normal MCV. His ferritin a few years back was 300 suggesting this was likely anemia of chronic disease. He denied any melena and his last colonoscopy was in 2014 which showed a normal colon with small internal hemorrhoids.  I offered him a CBC, iron studies, and stool cards today but his caretaker denied because she's trying to sort out his insurance. She said she'll be able to get it figured out and would like to get these tests next week.

## 2015-01-15 NOTE — Progress Notes (Signed)
Patient ID: Jared Woodward, male   DOB: 1940/05/14, 74 y.o.   MRN: [id number]   Subjective:   Patient ID: Jared Woodward male   DOB: 1941/01/18 74 y.o.   MRN: [id number]  HPI: [name] is a 74 y.o. is here for follow-up.  He complains of dizziness when he stands up and cramping in his legs, worse at night. He says he's been taking his amlodipine, propanolol, and chlorthalidone as prescribed and has been eating well. He denies polyuria, chest pain, or shortness of breath.  Please see the assessment and plan for the status of the patient's chronic medical conditions.  Past Medical History  Diagnosis Date  . 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 ANEMIA OF CHRONIC DISEASE 05/08/2006  . ANEMIA, VITAMIN B12 DEFICIENCY NEC 01/12/2007  . GLAUCOMA 05/08/2006  . AMAUROSIS FUGAX 12/24/2007  . HYPERTENSION 05/08/2006  . PULMONARY NODULE 10/15/2007  . RENAL INSUFFICIENCY 05/08/2006  . MENTAL RETARDATION 05/08/2006   Current Outpatient Prescriptions  Medication Sig Dispense Refill  . albuterol (PROVENTIL HFA) 108 (90 BASE) MCG/ACT inhaler Inhale 2 puffs into the lungs every 6 (six) hours as needed for wheezing. 1 Inhaler 2  . amLODipine (NORVASC) 5 MG tablet TAKE ONE TABLET BY MOUTH ONCE DAILY 90 tablet 0  . aspirin 81 MG tablet Take 81 mg by mouth daily.    . chlorthalidone (HYGROTON) 25 MG tablet TAKE ONE TABLET BY MOUTH ONCE DAILY 90 tablet 0  . diclofenac sodium (VOLTAREN) 1 % GEL Apply a small amount to shoulder or ankle twice daily 100 g 0  . dorzolamide (TRUSOPT) 2 % ophthalmic solution Place 1 drop into both eyes 2 (two) times daily. 10 mL 4  . FLUVIRIN INJ injection     . phenazopyridine (PYRIDIUM) 200 MG tablet Take 1 tablet (200 mg total) by mouth 3 (three) times daily. 6 tablet 0  . propranolol (INDERAL) 40 MG tablet TAKE ONE TABLET BY MOUTH TWICE DAILY 180 tablet 0  . thiamine 250 MG tablet Take 250 mg by mouth daily.    . TRAVATAN Z 0.004 % SOLN ophthalmic solution     .  vitamin B-12 (CYANOCOBALAMIN) 1000 MCG tablet 500 mcg 2 (two) times daily. Take 2 tablet daily per mouth.    . Vitamin D, Ergocalciferol, (DRISDOL) 50000 UNITS CAPS Take 1 capsule (50,000 Units total) by mouth every 7 (seven) days. 4 capsule 3   No current facility-administered medications for this visit.   Family History  Problem Relation Age of Onset  . Colon cancer Neg Hx   . Colon polyps Neg Hx   . Stomach cancer Neg Hx   . Rectal cancer Neg Hx    Social History   Social History  . Marital Status: Widowed    Spouse Name: N/A  . Number of Children: 0  . Years of Education: 2nd grade   Occupational History  . KITCHEN [employer]   Social History Main Topics  . Smoking status: Former Smoker -- 0.10 packs/day for 5 years    Types: Cigarettes    Quit date: 05/03/1988  . Smokeless tobacco: Never Used  . Alcohol Use: No  . Drug Use: No  . Sexual Activity: Not Asked   Other Topics Concern  . None   Social History Narrative   Patient lives [name] who seems to be the main Care Giver. The patient works at  [employer] at [employer].      Cannot read or write (except his name)  Review of Systems  Constitutional: Positive for weight loss. Negative for fever, chills, malaise/fatigue and diaphoresis.  HENT: Negative for sore throat.   Eyes: Negative for blurred vision, double vision and pain.  Respiratory: Negative for cough, shortness of breath and wheezing.   Cardiovascular: Negative for chest pain, palpitations and orthopnea.  Gastrointestinal: Negative for heartburn, nausea, vomiting, abdominal pain, diarrhea, blood in stool and melena.  Genitourinary: Negative for dysuria and urgency.  Musculoskeletal: Positive for back pain and joint pain. Negative for myalgias.  Skin: Negative for rash.  Neurological: Negative for dizziness, tingling, sensory change, speech change, seizures, weakness and headaches.    Objective:  Physical Exam: Filed Vitals:   01/15/15 1339    BP: 94/66  Pulse: 55  Temp: 98.1 F (36.7 C)  TempSrc: Oral  Height: 5\' 8"  (1.727 m)  Weight: 109 lb 8 oz (49.669 kg)  SpO2: 100%  Physical Exam  Constitutional: He appears well-developed and well-nourished. No distress.  HENT:  Head: Normocephalic and atraumatic.  Eyes: Conjunctivae and EOM are normal.  Neck: Normal range of motion. Neck supple.  Cardiovascular: Normal rate, regular rhythm and normal heart sounds.   No murmur heard. Pulmonary/Chest: Effort normal and breath sounds normal. He has no wheezes.  Abdominal: Soft. Bowel sounds are normal. He exhibits no distension.  Musculoskeletal: Normal range of motion. He exhibits no tenderness.  Skin: Skin is warm and dry.  Psychiatric: He has a normal mood and affect. His behavior is normal. Thought content normal.     Assessment & Plan:  Please see problem-based charting.

## 2015-01-15 NOTE — Assessment & Plan Note (Signed)
His knee and shoulder pain are very well-controlled on topical "cold gel." He can't remember the exact name but I suspect this is menthol-based.

## 2015-01-15 NOTE — Patient Instructions (Addendum)
Mr. Helmes,  It was a pleasure meeting you and your cousin today.  We talked about a couple things:  1) Your lightheadedness when you stand up is probably from your heart rate being a little too low and drinking sodas can further dehydrate you. Please STOP taking "CHLORTHALIDONE" and we'll re-check your blood pressure in a week. Also try to cut back on the sodas and try the "Mio" you cousin mentioned.  2) You cramps in your legs are probably also from your dehydration. Again, cut back on the sodas and let's see how they do next week.  It was a pleasure meeting you and I look forward to seeing you soon, Dr. Melburn Hake

## 2015-01-15 NOTE — Assessment & Plan Note (Signed)
Blood pressures have been well-controlled in 120s/80s in office for the last few years on amlodipine 5mg , propanolol 40mg  (for essential tremor), and chlorthalidone 25mg  daily.  In the office today, he complained of lightheadedness when standing for the last few months. His orthostatics were normal in the office but he was bradycardic in the low 50s. His propanolol is controlling his tremor well so I don't want to drop the dose just yet. Alternatively, given his chronic kidney disease, I think it's reasonable for him to stop taking his chlorthalidone which he was agreeable to. He is also drinking several sodas per day which may be further dehydrating him so I recommended he try to stop and alternatively start drinking "Mio" in his water which his cousin and caretaker has in her cabinet. Hopefully it is hypovolemia that is causing his symptoms and NOT bradycardia. If he remains symptomatic, I think the next step should be cutting down his propanolol dose.

## 2015-01-22 ENCOUNTER — Encounter: Payer: Self-pay | Admitting: Internal Medicine

## 2015-01-22 ENCOUNTER — Ambulatory Visit (INDEPENDENT_AMBULATORY_CARE_PROVIDER_SITE_OTHER): Payer: Medicare Other | Admitting: Internal Medicine

## 2015-01-22 VITALS — BP 107/68 | HR 60 | Temp 97.0°F | Wt 109.3 lb

## 2015-01-22 DIAGNOSIS — Z23 Encounter for immunization: Secondary | ICD-10-CM | POA: Diagnosis not present

## 2015-01-22 DIAGNOSIS — I10 Essential (primary) hypertension: Secondary | ICD-10-CM | POA: Diagnosis not present

## 2015-01-22 DIAGNOSIS — Z0000 Encounter for general adult medical examination without abnormal findings: Secondary | ICD-10-CM

## 2015-01-22 DIAGNOSIS — D638 Anemia in other chronic diseases classified elsewhere: Secondary | ICD-10-CM | POA: Diagnosis not present

## 2015-01-22 NOTE — Patient Instructions (Signed)
It was a pleasure to meet you Jared Woodward.  Please continue your blood pressure medication as prescribed.  We will check some blood work today and send you home with stool cards.  We are also giving you the flu shot today.

## 2015-01-22 NOTE — Assessment & Plan Note (Signed)
BP Readings from Last 3 Encounters:  01/22/15 107/68  01/15/15 94/66  01/09/15 125/73   Patient follows up for blood pressure check after discontinuing Chlorthalidone 25 mg last week. He reports compliance to Amlodipine 5 mg and Propranolol 40 mg. He denies any symptoms of lightheadedness/dizziness, syncope, chest pain, or SOB. His blood pressure and pulse have shown some improvement today from his hypotensive and bradycardic status last week. Will continue his current medications. -Continue Amlodpine 5 mg and Propranolol 40 mg

## 2015-01-22 NOTE — Assessment & Plan Note (Signed)
Influenza vaccine today

## 2015-01-22 NOTE — Progress Notes (Signed)
Patient ID: Jared Woodward, male   DOB: 1941/03/22, 74 y.o.   MRN: [id number]   Subjective:   Patient ID: Jared Woodward male   DOB: [birth year] 74 y.o.   MRN: [id number]  HPI: Jared Woodward is a 74 y.o. male with PMH as listed below who presents for follow up for blood pressure check and anemia of chronic disease.  Please see problem list for status of chronic medical conditions.  Past Medical History  Diagnosis Date  . 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 ANEMIA OF CHRONIC DISEASE 05/08/2006  . ANEMIA, VITAMIN B12 DEFICIENCY NEC 01/12/2007  . GLAUCOMA 05/08/2006  . AMAUROSIS FUGAX 12/24/2007  . HYPERTENSION 05/08/2006  . PULMONARY NODULE 10/15/2007  . RENAL INSUFFICIENCY 05/08/2006  . MENTAL RETARDATION 05/08/2006   Current Outpatient Prescriptions  Medication Sig Dispense Refill  . amLODipine (NORVASC) 5 MG tablet TAKE ONE TABLET BY MOUTH ONCE DAILY 90 tablet 0  . aspirin 81 MG tablet Take 81 mg by mouth daily.    . dorzolamide (TRUSOPT) 2 % ophthalmic solution Place 1 drop into both eyes 2 (two) times daily. 10 mL 4  . propranolol (INDERAL) 40 MG tablet TAKE ONE TABLET BY MOUTH TWICE DAILY 180 tablet 0  . TRAVATAN Z 0.004 % SOLN ophthalmic solution     . vitamin B-12 (CYANOCOBALAMIN) 1000 MCG tablet 500 mcg 2 (two) times daily. Take 2 tablet daily per mouth.    . Vitamin D, Ergocalciferol, (DRISDOL) 50000 UNITS CAPS Take 1 capsule (50,000 Units total) by mouth every 7 (seven) days. 4 capsule 3  . albuterol (PROVENTIL HFA) 108 (90 BASE) MCG/ACT inhaler Inhale 2 puffs into the lungs every 6 (six) hours as needed for wheezing. 1 Inhaler 2  . diclofenac sodium (VOLTAREN) 1 % GEL Apply a small amount to shoulder or ankle twice daily (Patient not taking: Reported on 01/22/2015) 100 g 0  . FLUVIRIN INJ injection     . thiamine 250 MG tablet Take 250 mg by mouth daily.     No current facility-administered medications for this visit.   Family History  Problem Relation Age of  Onset  . Colon cancer Neg Hx   . Colon polyps Neg Hx   . Stomach cancer Neg Hx   . Rectal cancer Neg Hx    Social History   Social History  . Marital Status: Widowed    Spouse Name: N/A  . Number of Children: 0  . Years of Education: 2nd grade   Occupational History  . KITCHEN [employer]   Social History Main Topics  . Smoking status: Former Smoker -- 0.10 packs/day for 5 years    Types: Cigarettes    Quit date: 05/03/1988  . Smokeless tobacco: Never Used  . Alcohol Use: No  . Drug Use: No  . Sexual Activity: Not Asked   Other Topics Concern  . None   Social History Narrative   Patient lives [name] who seems to be the main Care Giver. The patient works at  [employer] at [employer].      Cannot read or write (except his name)   Review of Systems: Review of Systems  Constitutional: Negative for fever and chills.  Respiratory: Negative for cough, shortness of breath and wheezing.   Cardiovascular: Negative for chest pain, palpitations, orthopnea and leg swelling.  Gastrointestinal: Negative for heartburn, nausea, vomiting, abdominal pain, diarrhea, constipation, blood in stool and melena.  Genitourinary: Negative for dysuria, urgency, frequency and hematuria.  Musculoskeletal: Negative for  falls.  Skin: Negative for rash.  Neurological: Negative for dizziness, focal weakness, weakness and headaches.    Objective:  Physical Exam: Filed Vitals:   01/22/15 1006  BP: 107/68  Pulse: 60  Temp: 97 F (36.1 C)  TempSrc: Oral  Weight: 109 lb 4.8 oz (49.578 kg)  SpO2: 100%   Physical Exam  Constitutional: He is oriented to person, place, and time.  Thin, pleasant man, NAD.  HENT:  Head: Normocephalic and atraumatic.  Cardiovascular: Normal rate, regular rhythm and intact distal pulses.   No murmur heard. Pulmonary/Chest: Effort normal and breath sounds normal. No respiratory distress. He has no wheezes.  Abdominal: Soft. There is no tenderness.    Musculoskeletal: Normal range of motion. He exhibits no edema or tenderness.  Neurological: He is alert and oriented to person, place, and time.  Skin: Skin is warm.  Psychiatric: He has a normal mood and affect.    Assessment & Plan:  Please see problem based charting for assessment and plan.

## 2015-01-22 NOTE — Assessment & Plan Note (Signed)
Patient with Hgb of 9.8 on 01/09/15 down from 11.9 on 8/16, though his baseline seems to be between 9-11. Previous ferritin of 300 in 2012 may indicate anemia of chronic disease. His latest MCV was normal and he is currently taking Vitamin B12 due to history of B12 deficiency. He denies any obvious signs of bleeding, hematemesis, hematochezia, melena, or symptoms concerning for acute blood loss. Prior colonoscopy in 2014 showed small internal hemorrhoids, otherwise normal colon.  -Check Anemia profile today -Home Stool cards -Will follow up on results and call patient in if needed, otherwise will follow up with PCP

## 2015-01-23 LAB — ANEMIA PROFILE B
Basophils Absolute: 0 10*3/uL (ref 0.0–0.2)
Basos: 0 %
EOS (ABSOLUTE): 0.1 10*3/uL (ref 0.0–0.4)
EOS: 2 %
FERRITIN: 407 ng/mL — AB (ref 30–400)
FOLATE: 10.7 ng/mL (ref >3.0)
HEMOGLOBIN: 10.5 g/dL — AB (ref 12.6–17.7)
Hematocrit: 30.9 % — ABNORMAL LOW (ref 37.5–51.0)
IMMATURE GRANULOCYTES: 0 %
IRON SATURATION: 38 % (ref 15–55)
Immature Grans (Abs): 0 10*3/uL (ref 0.0–0.1)
Iron: 82 ug/dL (ref 38–169)
LYMPHS ABS: 1.2 10*3/uL (ref 0.7–3.1)
Lymphs: 33 %
MCH: 32.3 pg (ref 26.6–33.0)
MCHC: 34 g/dL (ref 31.5–35.7)
MCV: 95 fL (ref 79–97)
MONOS ABS: 0.2 10*3/uL (ref 0.1–0.9)
Monocytes: 6 %
NEUTROS ABS: 2.1 10*3/uL (ref 1.4–7.0)
NEUTROS PCT: 59 %
Platelets: 260 10*3/uL (ref 150–379)
RBC: 3.25 x10E6/uL — ABNORMAL LOW (ref 4.14–5.80)
RDW: 14.5 % (ref 12.3–15.4)
Retic Ct Pct: 0.9 % (ref 0.6–2.6)
Total Iron Binding Capacity: 217 ug/dL — ABNORMAL LOW (ref 250–450)
UIBC: 135 ug/dL (ref 111–343)
VITAMIN B 12: 1346 pg/mL — AB (ref 211–946)
WBC: 3.6 10*3/uL (ref 3.4–10.8)

## 2015-01-23 NOTE — Progress Notes (Signed)
Internal Medicine Clinic Attending  I saw and evaluated the patient.  I personally confirmed the key portions of the history and exam documented by Dr. Patel,Vishal and I reviewed pertinent patient test results.  The assessment, diagnosis, and plan were formulated together and I agree with the documentation in the resident's note.  

## 2015-03-11 ENCOUNTER — Other Ambulatory Visit: Payer: Self-pay | Admitting: Internal Medicine

## 2015-03-12 ENCOUNTER — Other Ambulatory Visit: Payer: Self-pay

## 2015-03-12 MED ORDER — PROPRANOLOL HCL 40 MG PO TABS: 40.0000 mg | ORAL_TABLET | Freq: Two times a day (BID) | ORAL | Status: DC

## 2015-05-31 DIAGNOSIS — D472 Monoclonal gammopathy: Secondary | ICD-10-CM | POA: Diagnosis not present

## 2015-05-31 DIAGNOSIS — I10 Essential (primary) hypertension: Secondary | ICD-10-CM | POA: Diagnosis not present

## 2015-05-31 DIAGNOSIS — N184 Chronic kidney disease, stage 4 (severe): Secondary | ICD-10-CM | POA: Diagnosis not present

## 2015-06-10 ENCOUNTER — Other Ambulatory Visit: Payer: Self-pay | Admitting: Internal Medicine

## 2015-07-06 ENCOUNTER — Encounter: Payer: Self-pay | Admitting: Internal Medicine

## 2015-07-06 ENCOUNTER — Ambulatory Visit (INDEPENDENT_AMBULATORY_CARE_PROVIDER_SITE_OTHER): Payer: Medicare Other | Admitting: Internal Medicine

## 2015-07-06 VITALS — BP 136/85 | HR 60 | Temp 98.0°F | Ht 68.0 in | Wt 120.6 lb

## 2015-07-06 DIAGNOSIS — G250 Essential tremor: Secondary | ICD-10-CM | POA: Diagnosis not present

## 2015-07-06 DIAGNOSIS — H6120 Impacted cerumen, unspecified ear: Secondary | ICD-10-CM | POA: Insufficient documentation

## 2015-07-06 DIAGNOSIS — H6122 Impacted cerumen, left ear: Secondary | ICD-10-CM

## 2015-07-06 DIAGNOSIS — D472 Monoclonal gammopathy: Secondary | ICD-10-CM | POA: Diagnosis not present

## 2015-07-06 DIAGNOSIS — I10 Essential (primary) hypertension: Secondary | ICD-10-CM | POA: Diagnosis not present

## 2015-07-06 MED ORDER — PROPRANOLOL HCL 40 MG PO TABS: ORAL_TABLET | ORAL | Status: DC

## 2015-07-06 NOTE — Assessment & Plan Note (Addendum)
He had a cerumen impaction on exam so Nurse Gladys flushed out his ear and he was feeling much better. I recommended he buy a saline wash and avoid cue tips should the problem arise again.

## 2015-07-06 NOTE — Progress Notes (Signed)
Patient ID: SHELLY SHOULTZ, male   DOB: 02-10-1941, 75 y.o.   MRN: [id number] [facility] Subjective:   Patient ID: Jared Woodward male   DOB: 09-Oct-1940 75 y.o.   MRN: [id number]  HPI:  Jared Woodward is a 75 y.o. male with monoclonal gammopathy of undetermined significance, chronic kidney disease stage 3, and hypertension presenting for left ear pain and hearing loss follow-up of hypertension:  Left ear pain: For the past week, he's been having a dull ache in his hear with associated hearing loss. He denies any fevers, severe ear pain, or tooth pain.  Hypertension: He has been taking his propanolol as prescribed for essential tremor and denies any lightheadedness upon standing.  I have reviewed his medications with him and he is not smoking.  Review of Systems  Constitutional: Negative for fever, chills, weight loss and malaise/fatigue.  HENT: Positive for ear pain and hearing loss. Negative for ear discharge and sore throat.   Musculoskeletal: Negative for myalgias.  Neurological: Negative for dizziness, loss of consciousness and headaches.   Objective:  Physical Exam: Filed Vitals:   07/06/15 0948  BP: 149/84  Pulse: 60  Temp: 98 F (36.7 C)  TempSrc: Oral  Height: _0  (1.727 m)  Weight: 120 lb 9.6 oz (54.704 kg)  SpO2: 100%   General: very friendly thin man resting in bed comfortably, appropriately conversational HEENT: left ear with impacted cerumen, non-tender to pulling the tragus, oropharynx without lesions Cardiac: regular rate and rhythm, no rubs, murmurs or gallops Pulm: breathing well, clear to auscultation bilaterally Ext: warm and well perfused, without pedal edema Lymph: no cervical or supraclavicular lymphadenopathy  Assessment & Plan:  Case discussed with Dr. Evette Doffing  Cerumen impaction He had a cerumen impaction on exam so Nurse Gladys flushed out his ear and he was feeling much better. I recommended he buy a saline wash  and avoid cue tips should the problem arise again.  PARAPROTEINEMIA, MONOCLONAL He has monoclonal gammopathy of undetermined significance, and is followed by oncology. He was last seen in September 2016; at that time, they felt his anemia and slowly progressively worsening kidney disease was most likely due to time a renal insufficiency, not progression to multiple myeloma. He denies any B symptoms, back pain, or fatigue. I have checked a CBC and BMP to monitor renal function and anemia for progression. If anything is abnormal, I will get him in to see the oncologist in the next few months.  Essential hypertension, benign His blood pressure is at goal at 149/85 today on amlodipine and propanolol 40m once daily for essential tremor. We'll continue these medications and check his pressure again in 3 months.   Medications Ordered Meds ordered this encounter  Medications  . propranolol (INDERAL) 40 MG tablet    Sig: Take once daily for tremor    Dispense:  180 tablet    Refill:  1   Other Orders Orders Placed This Encounter  Procedures  . BMP8+Anion Gap  . CBC no Diff   Follow Up: Return in about 3 months (around 10/06/2015).

## 2015-07-06 NOTE — Assessment & Plan Note (Signed)
His blood pressure is at goal at 149/85 today on amlodipine and propanolol 40mg  once daily for essential tremor. We'll continue these medications and check his pressure again in 3 months.

## 2015-07-06 NOTE — Patient Instructions (Signed)
Mr. Beckius,  It was great to see you again today!  I'm glad we were able to flush out that ear. You can buy some over the counter ear washes if it ever happens again. I recommend staying away from cue tips because they can jam the wax even farther back and worsen the problem.  We'll check some labs today to make sure your kidneys and blood counts look good. I'll give you a call if anything is abnormal.  Take care and we'll see you in 3 months for another check-up, Dr. Melburn Hake

## 2015-07-06 NOTE — Assessment & Plan Note (Signed)
He has monoclonal gammopathy of undetermined significance, and is followed by oncology. He was last seen in September 2016; at that time, they felt his anemia and slowly progressively worsening kidney disease was most likely due to time a renal insufficiency, not progression to multiple myeloma. He denies any B symptoms, back pain, or fatigue. I have checked a CBC and BMP to monitor renal function and anemia for progression. If anything is abnormal, I will get him in to see the oncologist in the next few months.

## 2015-07-07 LAB — CBC
HEMOGLOBIN: 11.2 g/dL — AB (ref 12.6–17.7)
Hematocrit: 33.8 % — ABNORMAL LOW (ref 37.5–51.0)
MCH: 31.4 pg (ref 26.6–33.0)
MCHC: 33.1 g/dL (ref 31.5–35.7)
MCV: 95 fL (ref 79–97)
PLATELETS: 229 10*3/uL (ref 150–379)
RBC: 3.57 x10E6/uL — ABNORMAL LOW (ref 4.14–5.80)
RDW: 14.5 % (ref 12.3–15.4)
WBC: 4.2 10*3/uL (ref 3.4–10.8)

## 2015-07-07 LAB — BMP8+ANION GAP
ANION GAP: 16 mmol/L (ref 10.0–18.0)
BUN/Creatinine Ratio: 13 (ref 10–22)
BUN: 26 mg/dL (ref 8–27)
CALCIUM: 9.4 mg/dL (ref 8.6–10.2)
CO2: 27 mmol/L (ref 18–29)
CREATININE: 2.05 mg/dL — AB (ref 0.76–1.27)
Chloride: 101 mmol/L (ref 96–106)
GFR calc Af Amer: 36 mL/min/{1.73_m2} — ABNORMAL LOW (ref >59)
GFR calc non Af Amer: 31 mL/min/{1.73_m2} — ABNORMAL LOW (ref >59)
Glucose: 99 mg/dL (ref 65–99)
POTASSIUM: 5.6 mmol/L — AB (ref 3.5–5.2)
SODIUM: 144 mmol/L (ref 134–144)

## 2015-07-10 NOTE — Progress Notes (Signed)
Internal Medicine Clinic Attending  Case discussed with Dr. Flores at the time of the visit.  We reviewed the resident's history and exam and pertinent patient test results.  I agree with the assessment, diagnosis, and plan of care documented in the resident's note. 

## 2015-09-10 ENCOUNTER — Other Ambulatory Visit: Payer: Self-pay | Admitting: Internal Medicine

## 2015-09-10 DIAGNOSIS — R05 Cough: Secondary | ICD-10-CM

## 2015-09-10 DIAGNOSIS — R059 Cough, unspecified: Secondary | ICD-10-CM

## 2015-10-15 ENCOUNTER — Encounter: Payer: Medicare Other | Admitting: Internal Medicine

## 2015-10-15 ENCOUNTER — Encounter: Payer: Self-pay | Admitting: Internal Medicine

## 2015-10-16 ENCOUNTER — Encounter: Payer: Self-pay | Admitting: *Deleted

## 2015-10-18 ENCOUNTER — Ambulatory Visit (INDEPENDENT_AMBULATORY_CARE_PROVIDER_SITE_OTHER): Payer: Medicare Other | Admitting: Internal Medicine

## 2015-10-18 ENCOUNTER — Encounter: Payer: Self-pay | Admitting: Internal Medicine

## 2015-10-18 DIAGNOSIS — I10 Essential (primary) hypertension: Secondary | ICD-10-CM | POA: Diagnosis not present

## 2015-10-18 DIAGNOSIS — G4762 Sleep related leg cramps: Secondary | ICD-10-CM

## 2015-10-18 DIAGNOSIS — J301 Allergic rhinitis due to pollen: Secondary | ICD-10-CM

## 2015-10-18 DIAGNOSIS — Z0000 Encounter for general adult medical examination without abnormal findings: Secondary | ICD-10-CM

## 2015-10-18 DIAGNOSIS — J3489 Other specified disorders of nose and nasal sinuses: Secondary | ICD-10-CM

## 2015-10-18 MED ORDER — TRAVOPROST (BAK FREE) 0.004 % OP SOLN: 1.0000 [drp] | Freq: Every day | OPHTHALMIC | Status: AC

## 2015-10-18 MED ORDER — PROPRANOLOL HCL 40 MG PO TABS: ORAL_TABLET | ORAL | Status: DC

## 2015-10-18 MED ORDER — CETIRIZINE HCL 5 MG PO TABS: 5.0000 mg | ORAL_TABLET | Freq: Every day | ORAL | Status: DC

## 2015-10-18 NOTE — Patient Instructions (Signed)
General Instructions: - Make sure to take your blood pressure medications every day - For your leg cramps, try shaking your leg and then elevating it. You can also try massaging with ice, taking a hot shower, and stretching.  - Start cetrizine 5 mg daily for your allergies. This will help your runny nose. - Blood work today  Thank you for bringing your medicines today. This helps [place] keep you safe from mistakes.   Progress Toward Treatment Goals:  Treatment Goal 11/18/2012  Blood pressure improved    Self Care Goals & Plans:  Self Care Goal 07/06/2015  Manage my medications take my medicines as prescribed; bring my medications to every visit  Monitor my health keep track of my blood pressure  Eat healthy foods drink diet soda or water instead of juice or soda; eat more vegetables; eat foods that are low in salt; eat baked foods instead of fried foods; eat fruit for snacks and desserts  Be physically active find an activity I enjoy  Meeting treatment goals maintain the current self-care plan    No flowsheet data found.   Care Management & Community Referrals:  Referral 11/18/2012  Referrals made for care management support none needed  Referrals made to community resources none

## 2015-10-19 LAB — BMP8+ANION GAP
Anion Gap: 16 mmol/L (ref 10.0–18.0)
BUN / CREAT RATIO: 11 (ref 10–24)
BUN: 23 mg/dL (ref 8–27)
CHLORIDE: 102 mmol/L (ref 96–106)
CO2: 25 mmol/L (ref 18–29)
CREATININE: 2.02 mg/dL — AB (ref 0.76–1.27)
Calcium: 9.2 mg/dL (ref 8.6–10.2)
GFR calc non Af Amer: 32 mL/min/{1.73_m2} — ABNORMAL LOW (ref >59)
GFR, EST AFRICAN AMERICAN: 37 mL/min/{1.73_m2} — AB (ref >59)
GLUCOSE: 92 mg/dL (ref 65–99)
Potassium: 4.4 mmol/L (ref 3.5–5.2)
SODIUM: 143 mmol/L (ref 134–144)

## 2015-10-20 DIAGNOSIS — G4762 Sleep related leg cramps: Secondary | ICD-10-CM | POA: Insufficient documentation

## 2015-10-20 DIAGNOSIS — J309 Allergic rhinitis, unspecified: Secondary | ICD-10-CM | POA: Insufficient documentation

## 2015-10-20 NOTE — Assessment & Plan Note (Addendum)
BP mildly elevated today, but he did not take his BP medications this morning. I have asked him to record his morning BP over the next 2 weeks and then return to clinic for a BP recheck. No medication changes at this time.

## 2015-10-20 NOTE — Assessment & Plan Note (Signed)
Educated him on conservative measures to try for his leg cramps, including placing his legs under warm water in the bath tub, massaging the leg, and stretching his legs before bed. Will check a bmet today to assess his electrolytes.

## 2015-10-20 NOTE — Assessment & Plan Note (Signed)
His symptoms of rhinorrhea and itchy/watery eyes while being outside are most consistent with allergic rhinitis likely due to pollen. Advised patient to start Cetirizine 5 mg daily (lower dose due to age).

## 2015-10-20 NOTE — Assessment & Plan Note (Signed)
Gave prescription for Zostavax.

## 2015-10-20 NOTE — Progress Notes (Signed)
   Subjective:    Patient ID: Jared Woodward, male    DOB: 06-19-1940, 75 y.o.   MRN: [id number]  HPI Jared Woodward is a 75yo man with PMHx of HTN, CKD stage 3, and MGUS who presents today for follow up of his hypertension.  HTN: BP mildly elevated at 163/81 today. He reports he did not take his BP medications this morning. He takes Amlodipine 5 mg daily and Propranolol 40 mg daily.   Leg Cramps: Reports having leg cramps in both legs, particularly at night. He has been using "Fast Freeze" joint relief spray which alleviates the pain. He denies sitting for extended periods of time. He does exercise by riding his bicycle but typically only does this for 20-30 minutes a few times a week.   Rhinorrhea: Reports every time he goes outside his nose starts "running like water." He describes associated itchy and watery eyes. Denies excessive sneezing, sore throat, or cough. He does not take anything for seasonal allergies.    Review of Systems General: Denies fever, chills, night sweats, changes in weight, changes in appetite HEENT: Denies headaches, ear pain, changes in vision CV: Denies CP, palpitations, SOB, orthopnea Pulm: Denies SOB, wheezing GI: Denies abdominal pain, nausea, vomiting, diarrhea, constipation, melena, hematochezia GU: Denies dysuria, hematuria, frequency Msk: Denies joint pains Neuro: Denies weakness, numbness, tingling Skin: Denies rashes, bruising Psych: Denies depression, anxiety, hallucinations    Objective:   Physical Exam General: thin elderly man, NAD, pleasant  HEENT: [place]/AT, EOMI, sclera anicteric, nares normal, pharynx non-erythematous, no tenderness to palpation of sinuses, mucus membranes moist CV: RRR, no m/g/r Pulm: CTA bilaterally, breaths non-labored Abd: BS+, soft, non-tender Ext: warm, no peripheral edema Neuro: alert and oriented x 3    Assessment & Plan:  Please refer to A&P documentation.

## 2015-10-21 NOTE — Progress Notes (Signed)
Medicine attending: Medical history, presenting problems, physical findings, and medications, reviewed with resident physician Dr Carly Rivet on the day of the patient visit and I concur with her evaluation and management plan. 

## 2015-12-03 DIAGNOSIS — N184 Chronic kidney disease, stage 4 (severe): Secondary | ICD-10-CM | POA: Diagnosis not present

## 2015-12-04 ENCOUNTER — Other Ambulatory Visit: Payer: Self-pay | Admitting: *Deleted

## 2015-12-04 DIAGNOSIS — I10 Essential (primary) hypertension: Secondary | ICD-10-CM

## 2015-12-04 MED ORDER — PROPRANOLOL HCL 40 MG PO TABS: ORAL_TABLET | ORAL | 1 refills | Status: DC

## 2015-12-21 DIAGNOSIS — N2581 Secondary hyperparathyroidism of renal origin: Secondary | ICD-10-CM | POA: Diagnosis not present

## 2015-12-21 DIAGNOSIS — I10 Essential (primary) hypertension: Secondary | ICD-10-CM | POA: Diagnosis not present

## 2015-12-21 DIAGNOSIS — N184 Chronic kidney disease, stage 4 (severe): Secondary | ICD-10-CM | POA: Diagnosis not present

## 2016-01-08 ENCOUNTER — Ambulatory Visit: Payer: Medicare Other | Admitting: Oncology

## 2016-01-08 ENCOUNTER — Other Ambulatory Visit: Payer: Medicare Other

## 2016-01-11 ENCOUNTER — Encounter: Payer: Self-pay | Admitting: Internal Medicine

## 2016-01-14 ENCOUNTER — Encounter: Payer: Self-pay | Admitting: Internal Medicine

## 2016-02-07 ENCOUNTER — Ambulatory Visit (INDEPENDENT_AMBULATORY_CARE_PROVIDER_SITE_OTHER): Payer: Medicare Other | Admitting: *Deleted

## 2016-02-07 DIAGNOSIS — Z23 Encounter for immunization: Secondary | ICD-10-CM | POA: Diagnosis not present

## 2016-03-04 DIAGNOSIS — Z961 Presence of intraocular lens: Secondary | ICD-10-CM | POA: Diagnosis not present

## 2016-03-04 DIAGNOSIS — H401133 Primary open-angle glaucoma, bilateral, severe stage: Secondary | ICD-10-CM | POA: Diagnosis not present

## 2016-04-04 DIAGNOSIS — H401133 Primary open-angle glaucoma, bilateral, severe stage: Secondary | ICD-10-CM | POA: Diagnosis not present

## 2016-04-04 DIAGNOSIS — H04123 Dry eye syndrome of bilateral lacrimal glands: Secondary | ICD-10-CM | POA: Diagnosis not present

## 2016-04-14 ENCOUNTER — Ambulatory Visit (INDEPENDENT_AMBULATORY_CARE_PROVIDER_SITE_OTHER): Payer: Medicare Other | Admitting: Internal Medicine

## 2016-04-14 ENCOUNTER — Encounter: Payer: Self-pay | Admitting: Internal Medicine

## 2016-04-14 VITALS — BP 135/66 | HR 58 | Temp 97.7°F | Ht 68.0 in | Wt 118.3 lb

## 2016-04-14 DIAGNOSIS — N184 Chronic kidney disease, stage 4 (severe): Secondary | ICD-10-CM | POA: Diagnosis not present

## 2016-04-14 DIAGNOSIS — Z87891 Personal history of nicotine dependence: Secondary | ICD-10-CM

## 2016-04-14 DIAGNOSIS — J309 Allergic rhinitis, unspecified: Secondary | ICD-10-CM

## 2016-04-14 DIAGNOSIS — N2581 Secondary hyperparathyroidism of renal origin: Secondary | ICD-10-CM | POA: Diagnosis not present

## 2016-04-14 DIAGNOSIS — D518 Other vitamin B12 deficiency anemias: Secondary | ICD-10-CM

## 2016-04-14 DIAGNOSIS — I10 Essential (primary) hypertension: Secondary | ICD-10-CM

## 2016-04-14 DIAGNOSIS — J301 Allergic rhinitis due to pollen: Secondary | ICD-10-CM

## 2016-04-14 DIAGNOSIS — R911 Solitary pulmonary nodule: Secondary | ICD-10-CM

## 2016-04-14 DIAGNOSIS — I129 Hypertensive chronic kidney disease with stage 1 through stage 4 chronic kidney disease, or unspecified chronic kidney disease: Secondary | ICD-10-CM

## 2016-04-14 DIAGNOSIS — Z0000 Encounter for general adult medical examination without abnormal findings: Secondary | ICD-10-CM

## 2016-04-14 DIAGNOSIS — R636 Underweight: Secondary | ICD-10-CM

## 2016-04-14 DIAGNOSIS — Z79899 Other long term (current) drug therapy: Secondary | ICD-10-CM

## 2016-04-14 DIAGNOSIS — J45909 Unspecified asthma, uncomplicated: Secondary | ICD-10-CM | POA: Insufficient documentation

## 2016-04-14 DIAGNOSIS — J4520 Mild intermittent asthma, uncomplicated: Secondary | ICD-10-CM

## 2016-04-14 DIAGNOSIS — Z681 Body mass index (BMI) 19 or less, adult: Secondary | ICD-10-CM

## 2016-04-14 MED ORDER — CETIRIZINE HCL 5 MG PO TABS: 5.0000 mg | ORAL_TABLET | Freq: Every day | ORAL | 5 refills | Status: DC

## 2016-04-14 MED ORDER — THIAMINE HCL 250 MG PO TABS: 250.0000 mg | ORAL_TABLET | Freq: Every day | ORAL | 5 refills | Status: DC

## 2016-04-14 NOTE — Progress Notes (Signed)
   CC: HTN follow up   HPI:  [name] is a 75 y.o. M who is here for follow up of his HTN. He was seen in clinic 6 months ago and BP was elevated 163/81. He reportedly forgot to take his medications that morning. He was advised to follow up in 2 weeks for recheck but has not been seen in clinic until today. Today he reports medication compliance and BP is 135/66. He has no complaints and does not need medication refills. He follows with [facility] for his CKD stage IV and was last seen on 11/2015. Creatinine was stable 2.1 - 2.5. He was also felt to have mild secondary hyperparathyroidism and has been taking vitamin D supplements. Today he reports that he quit smoking cigarettes and drinking alcohol "awhile ago". He is a very poor historian and could not specify ay further. When asked how long he smoked he was unable to tell me. Does not know what age he started and stopped. He is also unsure of what medications he is taking. He brought a bag in today with all of his medicines. He is unable to tell me what he takes them for.   Past Medical History:  Diagnosis Date  . AMAUROSIS FUGAX 12/24/2007  . ANEMIA OF CHRONIC DISEASE 05/08/2006  . ANEMIA, VITAMIN B12 DEFICIENCY NEC 01/12/2007  . GLAUCOMA 05/08/2006  . HYPERTENSION 05/08/2006  . MENTAL RETARDATION 05/08/2006  . PARAPROTEINEMIA, [place] 10/15/2007  . PULMONARY NODULE 10/15/2007  . RENAL INSUFFICIENCY 05/08/2006    Review of Systems:  All pertinents listed in HPI, otherwise negative.   Physical Exam:  Vitals:   04/14/16 1357  BP: 135/66  Pulse: (!) 58  Temp: 97.7 F (36.5 C)  TempSrc: Oral  SpO2: 100%  Weight: 118 lb 4.8 oz (53.7 kg)  Height: 5\' 8"  (1.727 m)   Constitutional: Thin underweight man, NAD Cardiovascular: RRR, no murmurs, rubs, or gallops.  Pulmonary/Chest: Scattered expiratory wheezes  Abdominal: Soft, non tender, non distended. +BS.  Extremities: Warm and well perfused. No edema.  Neurological: A&Ox3,  CN II - XII grossly intact.  Skin: No rashes or erythema  Psychiatric: Normal mood and affect  Assessment & Plan:   See Encounters Tab for problem based charting.  Patient seen with Dr. Daryll Drown

## 2016-04-14 NOTE — Assessment & Plan Note (Signed)
Patient has a prior history of a left upper lobe lesion since 2009 and was previously following with Dr. Melvyn Novas (pulmonology) per chart review. 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 Radiology recommended follow up chest CT at 1 year if patient was high risk which patient never had done. He currently denies B symptoms (fevers, night sweats, weight loss) however is underweight today with BMI 17.9. Given his extensive smoking history, low BMI I think it is reasonable to follow-up with a low resolution chest CT.  -- Chest CT

## 2016-04-14 NOTE — Assessment & Plan Note (Addendum)
Takes Amlodipine 5 mg daily and Propranolol 40 mg daily for essential tremors. BP today 135/66. -- Continue amlodipine and propanolol  -- F/u 6 months

## 2016-04-14 NOTE — Assessment & Plan Note (Signed)
Patient was started on cetirizine at his last visit in June however never picked up the prescription. He continues to endorse symptoms of seasonal allergies. Encouraged patient to pick up his prescription. -- Refilled cetirizine 5 mg daily

## 2016-04-14 NOTE — Assessment & Plan Note (Addendum)
No PFTs on file but patient reports a history of asthma. He denies any current symptoms but is wheezing today on exam. Provided patient with an albuterol rescue inhaler.   Medication Samples have been provided to the patient. Drug name: Proventil        Strength: 90 mcg albuterol         Qty: 1   LOT: [id number]  Exp.Date: June, 2019 Dosing instructions: 2 puffs q6 hours prn   The patient has been instructed regarding the correct time, dose, and frequency of taking this medication, including desired effects and most common side effects.

## 2016-04-14 NOTE — Assessment & Plan Note (Signed)
Patient medication list in EPIC included B12 and thiamine supplements. Last Vit B12 level was checked 3 years ago and elevated > 1700. Patient has multiple medications and does not seem to know what he is taking. Instructed patient to discontinue B12 to simplify his regimen. Although thiamine was on his mediation list it was not in his medication bag today. The patient is no longer drinking, but given his malnutrition and underweight BMI, I will continued the thiamine. Sent refill to pharmacy.  -- Discontinue Vit B12  -- Continue thiamine

## 2016-04-14 NOTE — Patient Instructions (Signed)
You may stop taking your vitamin B12 supplements. Please continue to take your vitamin D. I would also like you to start taking thiamine supplements as well as zyrtec for your allergies. New prescriptions have been sent to your pharmacy. For your asthma, you may use the inhaler I gave you (albuterol) 2 puffs every 6 hours as needed for wheezing. I have also placed an order for you to have a CT scan of your chest to take another look at the nodule that was seen in your lungs. You will be contacted to schedule to study. Please follow up in 6 months or sooner if you have any problems. If you have any questions or concerns, call our clinic at [phone] or after hours call [phone] and ask for the internal medicine resident on call. Thank you!

## 2016-04-18 NOTE — Progress Notes (Signed)
Internal Medicine Clinic Attending  I saw and evaluated the patient.  I personally confirmed the key portions of the history and exam documented by Dr. Guilloud and I reviewed pertinent patient test results.  The assessment, diagnosis, and plan were formulated together and I agree with the documentation in the resident's note.  

## 2016-04-25 ENCOUNTER — Encounter: Payer: Self-pay | Admitting: Internal Medicine

## 2016-04-25 ENCOUNTER — Ambulatory Visit (HOSPITAL_COMMUNITY)
Admission: RE | Admit: 2016-04-25 | Discharge: 2016-04-25 | Disposition: A | Discharge status: Home / Self Care | Payer: Medicare Other | Source: Ambulatory Visit | Attending: Internal Medicine | Admitting: Internal Medicine

## 2016-04-25 DIAGNOSIS — I700 Atherosclerosis of aorta: Secondary | ICD-10-CM | POA: Insufficient documentation

## 2016-04-25 DIAGNOSIS — R911 Solitary pulmonary nodule: Secondary | ICD-10-CM

## 2016-04-25 DIAGNOSIS — J850 Gangrene and necrosis of lung: Secondary | ICD-10-CM | POA: Diagnosis not present

## 2016-04-25 DIAGNOSIS — R918 Other nonspecific abnormal finding of lung field: Secondary | ICD-10-CM | POA: Insufficient documentation

## 2016-06-16 DIAGNOSIS — N184 Chronic kidney disease, stage 4 (severe): Secondary | ICD-10-CM | POA: Diagnosis not present

## 2016-06-16 DIAGNOSIS — D649 Anemia, unspecified: Secondary | ICD-10-CM | POA: Diagnosis not present

## 2016-06-16 DIAGNOSIS — N2581 Secondary hyperparathyroidism of renal origin: Secondary | ICD-10-CM | POA: Diagnosis not present

## 2016-06-24 DIAGNOSIS — N2581 Secondary hyperparathyroidism of renal origin: Secondary | ICD-10-CM | POA: Diagnosis not present

## 2016-06-24 DIAGNOSIS — N184 Chronic kidney disease, stage 4 (severe): Secondary | ICD-10-CM | POA: Diagnosis not present

## 2016-06-24 DIAGNOSIS — I10 Essential (primary) hypertension: Secondary | ICD-10-CM | POA: Diagnosis not present

## 2016-06-24 DIAGNOSIS — D649 Anemia, unspecified: Secondary | ICD-10-CM | POA: Diagnosis not present

## 2016-07-23 ENCOUNTER — Ambulatory Visit (HOSPITAL_COMMUNITY)
Admission: EM | Admit: 2016-07-23 | Discharge: 2016-07-23 | Disposition: A | Discharge status: Home / Self Care | Payer: Medicare Other | Attending: Family Medicine | Admitting: Family Medicine

## 2016-07-23 ENCOUNTER — Encounter (HOSPITAL_COMMUNITY): Payer: Self-pay | Admitting: Emergency Medicine

## 2016-07-23 DIAGNOSIS — H9202 Otalgia, left ear: Secondary | ICD-10-CM

## 2016-07-23 DIAGNOSIS — H60332 Swimmer's ear, left ear: Secondary | ICD-10-CM

## 2016-07-23 DIAGNOSIS — H6122 Impacted cerumen, left ear: Secondary | ICD-10-CM

## 2016-07-23 DIAGNOSIS — H9203 Otalgia, bilateral: Secondary | ICD-10-CM

## 2016-07-23 MED ORDER — NEOMYCIN-POLYMYXIN-HC 3.5-10000-1 OT SUSP: 4.0000 [drp] | Freq: Three times a day (TID) | OTIC | 0 refills | Status: DC

## 2016-07-23 NOTE — ED Triage Notes (Signed)
The patient presented to the [facility] with a complaint of left ear pain x 2 weeks.

## 2016-07-23 NOTE — ED Provider Notes (Signed)
CSN: [id number]     Arrival date & time 07/23/16  1636 History   None    Chief Complaint  Patient presents with  . Otalgia   (Consider location/radiation/quality/duration/timing/severity/associated sxs/prior Treatment) Patient c/o left ear pain x 2 weeks.   The history is provided by the patient.  Otalgia  Location:  Bilateral Behind ear:  No abnormality Quality:  Aching Severity:  Moderate Onset quality:  Sudden Duration:  2 weeks Timing:  Constant Progression:  Worsening Chronicity:  New Relieved by:  Nothing Worsened by:  Nothing Ineffective treatments:  None tried   Past Medical History:  Diagnosis Date  . AMAUROSIS FUGAX 12/24/2007  . ANEMIA OF CHRONIC DISEASE 05/08/2006  . ANEMIA, VITAMIN B12 DEFICIENCY NEC 01/12/2007  . GLAUCOMA 05/08/2006  . HYPERTENSION 05/08/2006  . MENTAL RETARDATION 05/08/2006  . 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 PULMONARY NODULE 10/15/2007  . RENAL INSUFFICIENCY 05/08/2006   Past Surgical History:  Procedure Laterality Date  . TONSILLECTOMY  1956   Family History  Problem Relation Age of Onset  . Colon cancer Neg Hx   . Colon polyps Neg Hx   . Stomach cancer Neg Hx   . Rectal cancer Neg Hx    Social History  Substance Use Topics  . Smoking status: Former Smoker    Packs/day: 0.10    Years: 5.00    Types: Cigarettes    Quit date: 05/03/1988  . Smokeless tobacco: Never Used  . Alcohol use No    Review of Systems  Constitutional: Negative.   HENT: Positive for ear pain.   Eyes: Negative.   Respiratory: Negative.   Cardiovascular: Negative.   Gastrointestinal: Negative.   Endocrine: Negative.   Genitourinary: Negative.   Musculoskeletal: Negative.   Allergic/Immunologic: Negative.   Neurological: Negative.   Hematological: Negative.   Psychiatric/Behavioral: Negative.     Allergies  Lisinopril  Home Medications   Prior to Admission medications   Medication Sig Start Date End Date Taking? Authorizing Provider   amLODipine (NORVASC) 5 MG tablet TAKE ONE TABLET BY MOUTH ONCE DAILY 09/10/15   [name], MD  aspirin 81 MG tablet Take 81 mg by mouth daily.    Historical Provider, MD  cetirizine (ZYRTEC) 5 MG tablet Take 1 tablet (5 mg total) by mouth daily. 04/14/16   [name], MD  dorzolamide (TRUSOPT) 2 % ophthalmic solution Place 1 drop into both eyes 2 (two) times daily. 03/18/13   Jerrye Noble, MD  FLUVIRIN INJ injection Reported on 10/18/2015 01/24/13   Historical Provider, MD  neomycin-polymyxin-hydrocortisone (CORTISPORIN) 3.5-10000-1 otic suspension Place 4 drops into the left ear 3 (three) times daily. 07/23/16   Lysbeth Penner, FNP  propranolol (INDERAL) 40 MG tablet Take once daily for tremor 12/04/15   Annia Belt, MD  thiamine 250 MG tablet Take 1 tablet (250 mg total) by mouth daily. Reported on 10/18/2015 04/14/16   [name], MD  Travoprost, BAK Free, (TRAVATAN Z) 0.004 % SOLN ophthalmic solution Place 1 drop into both eyes at bedtime. 10/18/15   Juliet Rude, MD  Vitamin D, Ergocalciferol, (DRISDOL) 50000 UNITS CAPS Take 1 capsule (50,000 Units total) by mouth every 7 (seven) days. 11/18/12   Nino Glow McLean-Scocuzza, MD   Meds Ordered and Administered this Visit  Medications - No data to display  BP 138/64 (BP Location: Right Arm)   Pulse 68   Temp 97.8 F (36.6 C) (Oral)   Resp 18   SpO2 100%  No data found.  Physical Exam  Constitutional: He appears well-developed and well-nourished.  HENT:  Head: Normocephalic and atraumatic.  Mouth/Throat: Oropharynx is clear and moist.  TM's obscured with cerumen.  Eyes: Conjunctivae and EOM are normal. Pupils are equal, round, and reactive to light.  Neck: Normal range of motion. Neck supple.  Cardiovascular: Normal rate, regular rhythm and normal heart sounds.   Pulmonary/Chest: Effort normal and breath sounds normal.  Abdominal: Soft. Bowel sounds are normal.  Musculoskeletal: Normal range of motion.  Nursing  note and vitals reviewed.   Urgent Care Course     Procedures (including critical care time)  Labs Review Labs Reviewed - No data to display  Imaging Review No results found.   Visual Acuity Review  Right Eye Distance:   Left Eye Distance:   Bilateral Distance:    Right Eye Near:   Left Eye Near:    Bilateral Near:         MDM   1. Otalgia of both ears   2. Swimmer's ear of left side, unspecified chronicity    Right ear is semi clear after irrigation Cortisporin otic ear gtt's 4 gtt's AD qid    Lysbeth Penner, FNP 07/23/16 1753

## 2016-09-23 ENCOUNTER — Other Ambulatory Visit: Payer: Self-pay

## 2016-09-23 MED ORDER — CETIRIZINE HCL 10 MG PO TABS: 5.0000 mg | ORAL_TABLET | Freq: Every day | ORAL | 2 refills | Status: DC

## 2016-09-23 MED ORDER — AMLODIPINE BESYLATE 5 MG PO TABS: 5.0000 mg | ORAL_TABLET | Freq: Every day | ORAL | 3 refills | Status: DC

## 2016-09-23 NOTE — Telephone Encounter (Signed)
Also received fax from pharmacy with the following statement:  "cetirizine 5mg  is on backorder.  Can you change to the 10mg  with corresponding directions?" Will send info to pcp for review and rx change if appropriate.  Please advise.Despina Hidden Cassady5/29/20183:11 PM

## 2016-09-29 ENCOUNTER — Ambulatory Visit (INDEPENDENT_AMBULATORY_CARE_PROVIDER_SITE_OTHER): Payer: Medicare Other | Admitting: Internal Medicine

## 2016-09-29 ENCOUNTER — Encounter: Payer: Self-pay | Admitting: Internal Medicine

## 2016-09-29 VITALS — BP 119/76 | HR 65 | Temp 98.2°F | Wt 123.5 lb

## 2016-09-29 DIAGNOSIS — J309 Allergic rhinitis, unspecified: Secondary | ICD-10-CM

## 2016-09-29 DIAGNOSIS — I10 Essential (primary) hypertension: Secondary | ICD-10-CM | POA: Diagnosis not present

## 2016-09-29 DIAGNOSIS — Z87891 Personal history of nicotine dependence: Secondary | ICD-10-CM | POA: Diagnosis not present

## 2016-09-29 DIAGNOSIS — H6122 Impacted cerumen, left ear: Secondary | ICD-10-CM

## 2016-09-29 DIAGNOSIS — G250 Essential tremor: Secondary | ICD-10-CM | POA: Diagnosis not present

## 2016-09-29 DIAGNOSIS — H6123 Impacted cerumen, bilateral: Secondary | ICD-10-CM | POA: Diagnosis not present

## 2016-09-29 DIAGNOSIS — Z79899 Other long term (current) drug therapy: Secondary | ICD-10-CM

## 2016-09-29 DIAGNOSIS — J301 Allergic rhinitis due to pollen: Secondary | ICD-10-CM

## 2016-09-29 MED ORDER — PROPRANOLOL HCL 20 MG PO TABS: 20.0000 mg | ORAL_TABLET | Freq: Two times a day (BID) | ORAL | 5 refills | Status: DC

## 2016-09-29 MED ORDER — FLUTICASONE PROPIONATE 50 MCG/ACT NA SUSP: 2.0000 | Freq: Every day | NASAL | 2 refills | Status: DC

## 2016-09-29 NOTE — Patient Instructions (Addendum)
Mr. Novinger,  It was a pleasure to see you today. I have changed the dose of your propanolol. Please take 20 mg twice a day. You may take 1/2 a tablet of your current prescription until you run out and then start the new 20 mg tablets, twice daily. For your allergies, please continue to take zyrtec daily. I have sent a prescription to your pharmacy for flonase, a steroid nasal spray. Please use 2 sprays each nostril daily. Please continue to take your other medications as previously prescribed. Please return in 6 months for follow up. If you have any questions or concerns, call our clinic at [phone] or after hours call [phone] and ask for the internal medicine resident on call. Thank you!  - Dr. Philipp Ovens

## 2016-09-29 NOTE — Assessment & Plan Note (Signed)
Patient reports allergy symptoms are only partially controlled on his Zyrtec 10 mg daily. He continues to endorse daily rhinorrhea.  -- Continue Zyrtec 10 mg daily -- Start flonase 2 sprays each nostril daily -- Follow up as needed

## 2016-09-29 NOTE — Assessment & Plan Note (Signed)
Patient was seen in the ED on 07/23/16 for otalgia and treated for bilateral swimmer's ear with cortisporin drops. He reports his ear pain has now resolved. On exam he has significant bilateral cerumen impaction. This is a recurring problem for him. His ears were cleaned in clinic with peroxide solution and disimpacted. Instructed patient to use over the counter earwax softening solutions at home.  -- Follow up as needed

## 2016-09-29 NOTE — Assessment & Plan Note (Signed)
Patient has history of benign essential tremor for which he takes propanol 40 mg daily. Today he reports some relief with the medicine, but complains that its effects do not last throughout the day. He endorses tremor that interferes with his daily activity. We discussed changing the frequency of his propanolol to twice a day as this is a short acting medication. In the future, consider changing to a 3 times a day regimen if patient finds this beneficial.  -- Change propanolol 40 mg daily -> 20 mg BID

## 2016-09-29 NOTE — Assessment & Plan Note (Signed)
Blood pressure is well-controlled today, 119/76. He reports compliance with his amlodipine 5 mg daily and propanolol 40 mg daily. We discussed changing his propanolol to 20 mg twice a day (see essential tremor A&P).  -- Continue Amlodipine 5 mg daily -- Change propanolol to 20 mg BID

## 2016-09-29 NOTE — Progress Notes (Signed)
   CC: BP follow up   HPI:  [name] is a 76 y.o. male with past medical history outlined below here for blood pressure follow up. For the details of today's visit, please refer to the assessment and plan.  Past Medical History:  Diagnosis Date  . AMAUROSIS FUGAX 12/24/2007  . ANEMIA OF CHRONIC DISEASE 05/08/2006  . ANEMIA, VITAMIN B12 DEFICIENCY NEC 01/12/2007  . GLAUCOMA 05/08/2006  . HYPERTENSION 05/08/2006  . MENTAL RETARDATION 05/08/2006  . 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 PULMONARY NODULE 10/15/2007  . RENAL INSUFFICIENCY 05/08/2006    Review of Systems:  Denies chest pain and SOB. Endorses rhinorrhea.   Physical Exam:  Vitals:   09/29/16 1338  BP: 119/76  Pulse: 65  Temp: 98.2 F (36.8 C)  TempSrc: Oral  SpO2: 100%  Weight: 123 lb 8 oz (56 kg)    Constitutional: NAD, appears comfortable HEENT: Bilateral cerumen impaction Cardiovascular: RRR, no murmurs, rubs, or gallops.  Pulmonary/Chest: CTAB Abdominal: Soft, non tender, non distended. +BS.  Psychiatric: Normal mood and affect  Assessment & Plan:   See Encounters Tab for problem based charting.  Patient discussed with Dr. Daryll Drown

## 2016-09-30 NOTE — Progress Notes (Signed)
Internal Medicine Clinic Attending  Case discussed with Dr. Guilloud at the time of the visit.  We reviewed the resident's history and exam and pertinent patient test results.  I agree with the assessment, diagnosis, and plan of care documented in the resident's note.  

## 2016-10-10 DIAGNOSIS — H401133 Primary open-angle glaucoma, bilateral, severe stage: Secondary | ICD-10-CM | POA: Diagnosis not present

## 2016-10-10 DIAGNOSIS — Z961 Presence of intraocular lens: Secondary | ICD-10-CM | POA: Diagnosis not present

## 2016-11-09 ENCOUNTER — Other Ambulatory Visit: Payer: Self-pay | Admitting: Internal Medicine

## 2016-12-31 ENCOUNTER — Other Ambulatory Visit: Payer: Self-pay | Admitting: Internal Medicine

## 2017-02-09 DIAGNOSIS — N184 Chronic kidney disease, stage 4 (severe): Secondary | ICD-10-CM | POA: Diagnosis not present

## 2017-02-13 DIAGNOSIS — H04123 Dry eye syndrome of bilateral lacrimal glands: Secondary | ICD-10-CM | POA: Diagnosis not present

## 2017-02-13 DIAGNOSIS — H401133 Primary open-angle glaucoma, bilateral, severe stage: Secondary | ICD-10-CM | POA: Diagnosis not present

## 2017-02-13 DIAGNOSIS — Z961 Presence of intraocular lens: Secondary | ICD-10-CM | POA: Diagnosis not present

## 2017-02-16 DIAGNOSIS — D649 Anemia, unspecified: Secondary | ICD-10-CM | POA: Diagnosis not present

## 2017-02-16 DIAGNOSIS — N184 Chronic kidney disease, stage 4 (severe): Secondary | ICD-10-CM | POA: Diagnosis not present

## 2017-02-16 DIAGNOSIS — I10 Essential (primary) hypertension: Secondary | ICD-10-CM | POA: Diagnosis not present

## 2017-02-16 DIAGNOSIS — N2581 Secondary hyperparathyroidism of renal origin: Secondary | ICD-10-CM | POA: Diagnosis not present

## 2017-02-17 ENCOUNTER — Ambulatory Visit (INDEPENDENT_AMBULATORY_CARE_PROVIDER_SITE_OTHER): Payer: Medicare Other | Admitting: Internal Medicine

## 2017-02-17 ENCOUNTER — Encounter: Payer: Self-pay | Admitting: Internal Medicine

## 2017-02-17 VITALS — BP 142/61 | HR 49 | Temp 98.0°F | Ht 68.0 in | Wt 118.4 lb

## 2017-02-17 DIAGNOSIS — R4189 Other symptoms and signs involving cognitive functions and awareness: Secondary | ICD-10-CM | POA: Diagnosis not present

## 2017-02-17 DIAGNOSIS — Z13220 Encounter for screening for lipoid disorders: Secondary | ICD-10-CM | POA: Diagnosis not present

## 2017-02-17 DIAGNOSIS — Z550 Illiteracy and low-level literacy: Secondary | ICD-10-CM

## 2017-02-17 DIAGNOSIS — Z0000 Encounter for general adult medical examination without abnormal findings: Secondary | ICD-10-CM | POA: Diagnosis not present

## 2017-02-17 DIAGNOSIS — Z23 Encounter for immunization: Secondary | ICD-10-CM | POA: Diagnosis not present

## 2017-02-17 DIAGNOSIS — E785 Hyperlipidemia, unspecified: Secondary | ICD-10-CM | POA: Diagnosis not present

## 2017-02-17 MED ORDER — CETIRIZINE HCL 10 MG PO TABS: ORAL_TABLET | ORAL | 2 refills | Status: DC

## 2017-02-17 NOTE — Patient Instructions (Addendum)
Annual Wellness Visit   Medicare Covered Preventative Screenings and Services  Services & Screenings Men and Women Who How Often Need? Date of Last Service Action  Abdominal Aortic Aneurysm Adults with AAA risk factors Once No    Alcohol Misuse and Counseling All Adults Screening once a year if no alcohol misuse. Counseling up to 4 face to face sessions. No    Bone Density Measurement  Adults at risk for osteoporosis Once every 2 yrs No    Lipid Panel Z13.6 All adults without CV disease Once every 5 yrs Yes NA  Lipid panel today  Colorectal Cancer   Stool sample or  Colonoscopy All adults 69 and older   Once every year  Every 10 years No 2012  Repeat 2022  Depression All Adults Once a year Yes Today  Monitor for now  Diabetes Screening Blood glucose, post glucose load, or GTT Z13.1  All adults at risk  Pre-diabetics  Once per year  Twice per year No    Diabetes  Self-Management Training All adults Diabetics 10 hrs first year; 2 hours subsequent years. Requires Copay No    Glaucoma  Diabetics  Family history of glaucoma  African Americans 93 yrs +  Hispanic Americans 19 yrs + Annually - requires coppay No    Hepatitis C Z72.89 or F19.20  High Risk for HCV  Born between 1945 and 1965  Annually  Once No    HIV Z11.4 All adults based on risk  Annually btw ages 64 & 75 regardless of risk  Annually > 65 yrs if at increased risk No    Lung Cancer Screening Asymptomatic adults aged 77-77 with 68 pack yr history and current smoker OR quit within the last 15 yrs Annually Must have counseling and shared decision making documentation before first screen No CT chest 04/25/17  Monitor  Medical Nutrition Therapy Adults with   Diabetes  Renal disease  Kidney transplant within past 3 yrs 3 hours first year; 2 hours subsequent years No    Obesity and Counseling All adults Screening once a year Counseling if BMI 30 or higher No Today   Tobacco Use Counseling Adults who use  tobacco  Up to 8 visits in one year No    Vaccines Z23  Hepatitis B  Influenza   Pneumonia  Adults   Once  Once every flu season  Two different vaccines separated by one year No Flu shot today Pneumonia Vaccine today  Given today  Next Annual Wellness Visit People with Medicare Every year No 02/17/17  October 2019     Services & Screenings Men Who How Ofter Need  Date of Last Service Action  Prostate Cancer - DRE & PSA Men over 50 Annually.  DRE might require a copay. No  None  Defer for now. Discussed risks & benefits.   Sexually Transmitted Diseases  Syphilis All at risk adults Annually for men at increased risk No        Things That May Be Affecting Your Health:  Alchol  Hearing loss  Pain   x Depression  Home Safety  Sexual Health   Diabetes  Lack of physical activity  Stress  x Difficulty with daily activities  Loneliness  Tiredness   Drug use  Medicines  Tobacco use   Falls x Motor Vehicle Safety  Weight  x Food choices  Oral Health  Other    YOUR PERSONALIZED HEALTH PLAN : 1. Schedule your next subsequent Medicare Wellness visit in one  year 2. Attend all of your regular appointments to address your medical issues 3. Complete the preventative screenings and services 4. I will call you with the results of your lab work today. We check your cholesterol levels. 5. Call us with any questions! [phone]

## 2017-02-17 NOTE — Progress Notes (Signed)
   Subjective:   Jared Woodward is a 76 y.o. male who presents for a Medicare Annual Wellness Visit.  The following items have been reviewed and updated today in the appropriate area in the EMR.   Health Risk Assessment  Height, weight, BMI, and BP Depression screen Fall risk / safety level Medical and family history were reviewed and updated Updating list of other providers & suppliers Medication reconciliation, including over the counter medicines Cognitive screen - unable to perform due to known cognitive delay Written screening schedule Risk Factor list Personalized health advice, risky behaviors, and treatment advice       Objective:    Vitals: BP (!) 142/61 (BP Location: Right Arm, Patient Position: Sitting, Cuff Size: Small)   Pulse (!) 49   Temp 98 F (36.7 C) (Oral)   Ht 5\' 8"  (1.727 m)   Wt 118 lb 6.4 oz (53.7 kg)   SpO2 100%   BMI 18.00 kg/m   Activities of Daily Living In your present state of health, do you have any difficulty performing the following activities: 02/17/2017 09/29/2016  Hearing? N N  Vision? N Y  Comment - sometimes-wears glasses  Difficulty concentrating or making decisions? N Y  Comment - at times  Walking or climbing stairs? N N  Dressing or bathing? N N  Doing errands, shopping? N Y  Some recent data might be hidden    Goals Goals    . Blood Pressure < 140/90       Fall Risk Fall Risk  02/17/2017 09/29/2016 04/14/2016 10/18/2015 01/22/2015  Falls in the past year? No No No No No    Depression Screen PHQ 2/9 Scores 02/17/2017 09/29/2016 04/14/2016 10/18/2015  PHQ - 2 Score 1 0 0 0     Cognitive Testing Unable to be performed as patient has a known cognitive delay and is illiterate.    Assessment and Plan:    During the course of the visit the patient was educated and counseled about appropriate screening and preventive services as documented in the assessment and plan.  The printed AVS was given to the patient and  included an updated screening schedule, a list of risk factors, and personalized health advice.    No problem-specific Assessment & Plan notes found for this encounter.   [name], MD  02/17/2017

## 2017-02-18 LAB — LIPID PANEL
Chol/HDL Ratio: 2.9 ratio (ref 0.0–5.0)
Cholesterol, Total: 133 mg/dL (ref 100–199)
HDL: 46 mg/dL (ref >39)
LDL CALC: 72 mg/dL (ref 0–99)
Triglycerides: 74 mg/dL (ref 0–149)
VLDL Cholesterol Cal: 15 mg/dL (ref 5–40)

## 2017-02-20 NOTE — Progress Notes (Signed)
Internal Medicine Clinic Attending  Case discussed with Dr. Guilloud at the time of the visit.  We reviewed the resident's history and exam and pertinent patient test results.  I agree with the assessment, diagnosis, and plan of care documented in the resident's note.  

## 2017-02-26 NOTE — Assessment & Plan Note (Signed)
Lipid panel with total cholesterol of 133, LDL of 72. Calculated 10 year ASCVD risk of 23.5%. Given his age, we will start a moderate intensity statin. Unable to reach patient by phone. Please start statin at next follow up.  -- Atorvastatin 10 mg daily, please start at next follow up visit

## 2017-03-30 ENCOUNTER — Encounter: Payer: Medicare Other | Admitting: Internal Medicine

## 2017-06-05 DIAGNOSIS — H401133 Primary open-angle glaucoma, bilateral, severe stage: Secondary | ICD-10-CM | POA: Diagnosis not present

## 2017-06-05 DIAGNOSIS — H04123 Dry eye syndrome of bilateral lacrimal glands: Secondary | ICD-10-CM | POA: Diagnosis not present

## 2017-06-05 DIAGNOSIS — Z961 Presence of intraocular lens: Secondary | ICD-10-CM | POA: Diagnosis not present

## 2017-07-20 ENCOUNTER — Other Ambulatory Visit: Payer: Self-pay | Admitting: *Deleted

## 2017-07-21 MED ORDER — CETIRIZINE HCL 10 MG PO TABS: ORAL_TABLET | ORAL | 2 refills | Status: DC

## 2017-08-17 ENCOUNTER — Ambulatory Visit (INDEPENDENT_AMBULATORY_CARE_PROVIDER_SITE_OTHER): Payer: Medicare Other | Admitting: Internal Medicine

## 2017-08-17 ENCOUNTER — Encounter: Payer: Self-pay | Admitting: Internal Medicine

## 2017-08-17 DIAGNOSIS — I10 Essential (primary) hypertension: Secondary | ICD-10-CM

## 2017-08-17 DIAGNOSIS — N2581 Secondary hyperparathyroidism of renal origin: Secondary | ICD-10-CM | POA: Diagnosis not present

## 2017-08-17 DIAGNOSIS — E785 Hyperlipidemia, unspecified: Secondary | ICD-10-CM | POA: Diagnosis not present

## 2017-08-17 DIAGNOSIS — G250 Essential tremor: Secondary | ICD-10-CM | POA: Diagnosis not present

## 2017-08-17 DIAGNOSIS — Z79899 Other long term (current) drug therapy: Secondary | ICD-10-CM

## 2017-08-17 DIAGNOSIS — N184 Chronic kidney disease, stage 4 (severe): Secondary | ICD-10-CM | POA: Diagnosis not present

## 2017-08-17 MED ORDER — ATORVASTATIN CALCIUM 10 MG PO TABS: 10.0000 mg | ORAL_TABLET | Freq: Every day | ORAL | 11 refills | Status: DC

## 2017-08-17 MED ORDER — PROPRANOLOL HCL 40 MG PO TABS: 40.0000 mg | ORAL_TABLET | Freq: Two times a day (BID) | ORAL | 5 refills | Status: DC

## 2017-08-17 NOTE — Progress Notes (Signed)
   CC: HTN, essential tremor follow up  HPI:  [name] is a 77 y.o. male with past medical history outlined below here for HTN, essential tremor follow up. For the details of today's visit, please refer to the assessment and plan.  Past Medical History:  Diagnosis Date  . AMAUROSIS FUGAX 12/24/2007  . ANEMIA OF CHRONIC DISEASE 05/08/2006  . ANEMIA, VITAMIN B12 DEFICIENCY NEC 01/12/2007  . GLAUCOMA 05/08/2006  . HYPERTENSION 05/08/2006  . MENTAL RETARDATION 05/08/2006  . 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 PULMONARY NODULE 10/15/2007  . RENAL INSUFFICIENCY 05/08/2006    ROS  Physical Exam:  Vitals:   08/17/17 1318  BP: 136/82  Pulse: (!) 54  Temp: 98 F (36.7 C)  TempSrc: Oral  SpO2: 100%  Weight: 124 lb (56.2 kg)    Constitutional: NAD, appears comfortable Cardiovascular: RRR, no murmurs, rubs, or gallops.  Pulmonary/Chest: CTAB Extremities: Warm and well perfused, no edema. No skin changes. Psychiatric: Normal mood and affect  Assessment & Plan:   See Encounters Tab for problem based charting.  Patient discussed with Dr. Angelia Mould

## 2017-08-17 NOTE — Assessment & Plan Note (Signed)
Lipid panel checked at his last visit with calculated 10 year ASCVD risk of 23.5%. Given his age, we will start a moderate intensity statin. -- Atorvastatin 10 mg daily

## 2017-08-17 NOTE — Assessment & Plan Note (Signed)
Patient has chronic essential tremor that has been documented for at least 10 years. He has been taking propanolol for his. Currently on 20 mg BID. He reports only partial improvement, continues to have symptomatic action tremor. Blood pressure today is 136/82, HR 54. Will increase to 40 mg BID.

## 2017-08-17 NOTE — Assessment & Plan Note (Signed)
Blood pressure today is 136/82. He is currently taking amlodipine 5 mg daily and propanolol 20 mg BID for essential tremor. We are uptitrating his propanolol today for better control of his tremor. Will monitor BP.  -- Continue amlodipine 5 mg daily -- Increase propanolol to 40 mg BID

## 2017-08-17 NOTE — Patient Instructions (Signed)
FOLLOW-UP INSTRUCTIONS When: 6 months For: PCP follow up What to bring: Medications  Mr. Meisenheimer,  It was a pleasure to see you again. I am glad you are doing well. For your tremor, I have increased your propanolol to 40 mg twice daily. You may take 2 tablets of your current prescription twice a day until you run out, then start your new prescription. I have also started you on a cholesterol medicine called atorvastatin. Please take this once a day.   You may to go you local pharmacy to receive the shingles vaccine. You are up to date with all other vaccines.  Follow up with me again in 6 months or sooner if needed. If you have any questions or concerns, call our clinic at [phone] or after hours call [phone] and ask for the internal medicine resident on call. Thank you!  - Dr. Philipp Ovens

## 2017-08-18 NOTE — Progress Notes (Signed)
Internal Medicine Clinic Attending  Case discussed with Dr. Guilloud at the time of the visit.  We reviewed the resident's history and exam and pertinent patient test results.  I agree with the assessment, diagnosis, and plan of care documented in the resident's note.  

## 2017-08-25 ENCOUNTER — Other Ambulatory Visit: Payer: Self-pay | Admitting: *Deleted

## 2017-08-25 DIAGNOSIS — D631 Anemia in chronic kidney disease: Secondary | ICD-10-CM | POA: Diagnosis not present

## 2017-08-25 DIAGNOSIS — N2581 Secondary hyperparathyroidism of renal origin: Secondary | ICD-10-CM | POA: Diagnosis not present

## 2017-08-25 DIAGNOSIS — I129 Hypertensive chronic kidney disease with stage 1 through stage 4 chronic kidney disease, or unspecified chronic kidney disease: Secondary | ICD-10-CM | POA: Diagnosis not present

## 2017-08-25 DIAGNOSIS — N184 Chronic kidney disease, stage 4 (severe): Secondary | ICD-10-CM | POA: Diagnosis not present

## 2017-08-25 DIAGNOSIS — E785 Hyperlipidemia, unspecified: Secondary | ICD-10-CM

## 2017-08-26 MED ORDER — ATORVASTATIN CALCIUM 10 MG PO TABS: 10.0000 mg | ORAL_TABLET | Freq: Every day | ORAL | 3 refills | Status: DC

## 2017-10-08 DIAGNOSIS — Z961 Presence of intraocular lens: Secondary | ICD-10-CM | POA: Diagnosis not present

## 2017-10-08 DIAGNOSIS — H401133 Primary open-angle glaucoma, bilateral, severe stage: Secondary | ICD-10-CM | POA: Diagnosis not present

## 2017-10-08 DIAGNOSIS — H04123 Dry eye syndrome of bilateral lacrimal glands: Secondary | ICD-10-CM | POA: Diagnosis not present

## 2017-10-19 ENCOUNTER — Other Ambulatory Visit: Payer: Self-pay | Admitting: Internal Medicine

## 2017-11-19 DIAGNOSIS — Z961 Presence of intraocular lens: Secondary | ICD-10-CM | POA: Diagnosis not present

## 2017-11-19 DIAGNOSIS — H04123 Dry eye syndrome of bilateral lacrimal glands: Secondary | ICD-10-CM | POA: Diagnosis not present

## 2017-11-19 DIAGNOSIS — H401133 Primary open-angle glaucoma, bilateral, severe stage: Secondary | ICD-10-CM | POA: Diagnosis not present

## 2018-01-06 ENCOUNTER — Ambulatory Visit (INDEPENDENT_AMBULATORY_CARE_PROVIDER_SITE_OTHER): Payer: Medicare Other | Admitting: Internal Medicine

## 2018-01-06 ENCOUNTER — Other Ambulatory Visit: Payer: Self-pay

## 2018-01-06 VITALS — BP 147/74 | HR 53 | Temp 97.8°F | Ht 68.0 in | Wt 119.8 lb

## 2018-01-06 DIAGNOSIS — R29898 Other symptoms and signs involving the musculoskeletal system: Secondary | ICD-10-CM

## 2018-01-06 DIAGNOSIS — N184 Chronic kidney disease, stage 4 (severe): Secondary | ICD-10-CM | POA: Diagnosis not present

## 2018-01-06 DIAGNOSIS — W19XXXA Unspecified fall, initial encounter: Secondary | ICD-10-CM | POA: Diagnosis not present

## 2018-01-06 DIAGNOSIS — Z9181 History of falling: Secondary | ICD-10-CM

## 2018-01-06 DIAGNOSIS — D472 Monoclonal gammopathy: Secondary | ICD-10-CM

## 2018-01-06 DIAGNOSIS — G250 Essential tremor: Secondary | ICD-10-CM

## 2018-01-06 DIAGNOSIS — D529 Folate deficiency anemia, unspecified: Secondary | ICD-10-CM | POA: Diagnosis not present

## 2018-01-06 DIAGNOSIS — D638 Anemia in other chronic diseases classified elsewhere: Secondary | ICD-10-CM

## 2018-01-06 DIAGNOSIS — E559 Vitamin D deficiency, unspecified: Secondary | ICD-10-CM

## 2018-01-06 DIAGNOSIS — E538 Deficiency of other specified B group vitamins: Secondary | ICD-10-CM | POA: Diagnosis not present

## 2018-01-06 DIAGNOSIS — M549 Dorsalgia, unspecified: Secondary | ICD-10-CM | POA: Insufficient documentation

## 2018-01-06 DIAGNOSIS — Z7689 Persons encountering health services in other specified circumstances: Secondary | ICD-10-CM | POA: Diagnosis not present

## 2018-01-06 DIAGNOSIS — I129 Hypertensive chronic kidney disease with stage 1 through stage 4 chronic kidney disease, or unspecified chronic kidney disease: Secondary | ICD-10-CM | POA: Diagnosis not present

## 2018-01-06 DIAGNOSIS — Z862 Personal history of diseases of the blood and blood-forming organs and certain disorders involving the immune mechanism: Secondary | ICD-10-CM

## 2018-01-06 NOTE — Assessment & Plan Note (Addendum)
Patient with recurrent falls in setting of h/o anemia, paraproteinemia with associated symptoms of not being able to gain weight and cold intolerance. Neuro exam is largely unremarkable; physical exam with decreased muscle mass throughout.  Etiology could be decreased strength from poor muscle mass, neuropathy (b12, syphilis, MM). In setting of h/o paraproteinemia and point tenderness of spine concerning for compression fracture there is concern for progression of paraproteinemia.  Plan: --referral to PT for strength exercises --pt will call to set up appt with Dr. Alen Blew for paraproteinemia f/u (last seen in 2016) --f/u RPR, B12, folate, TSH, free T4  Addendum: W/u unremarkable; continue with referral to PT and f/u with Dr. Alen Blew.

## 2018-01-06 NOTE — Patient Instructions (Addendum)
We'll check some blood work on you today and get xrays of your spine.  Please call Dr. Hazeline Junker office [phone]) to schedule appointment in the next month or two.

## 2018-01-06 NOTE — Progress Notes (Signed)
   CC: falls  HPI:  [name] is a 77 y.o. with a PMH of monoclonal paraproteinemia, essential tremor, HTN, anemia of chronic disease, Vit D deficiency, CKD 4 presenting to clinic for falls.  Patient reports some month h/o of recurrent falls which occur either upon getting up from seated position or more likely in the first couple of steps after getting up. He endorses that he more often than not falls forward. Last fall was over a month ago when he underestimated height of curb, causing him to trip and fall forward. He states that he feels instability in his knees and hips just prior to falling and has caught himself before preventing a fall. He denies dizziness on standing, chest pain, shortness of breath, palpitations, vision or hearing changes, focal weakness; he endorses occasional numbness in his hands, occasional back pain, and occasional tremor which is controlled when he takes propranolol.   Patient and his cousin (who helps him out) state that patient eats all the time and can't seem to gain weight; he endorses feeling cold most of the time.  Please see problem based Assessment and Plan for status of patients chronic conditions.  Past Medical History:  Diagnosis Date  . AMAUROSIS FUGAX 12/24/2007  . ANEMIA OF CHRONIC DISEASE 05/08/2006  . ANEMIA, VITAMIN B12 DEFICIENCY NEC 01/12/2007  . GLAUCOMA 05/08/2006  . HYPERTENSION 05/08/2006  . MENTAL RETARDATION 05/08/2006  . 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 PULMONARY NODULE 10/15/2007  . RENAL INSUFFICIENCY 05/08/2006    Review of Systems:   Per HPI  Physical Exam:  Vitals:   01/06/18 1038  BP: (!) 147/74  Pulse: (!) 53  Temp: 97.8 F (36.6 C)  TempSrc: Oral  SpO2: 100%  Weight: 119 lb 12.8 oz (54.3 kg)  Height: 5\' 8"  (1.727 m)   GENERAL- alert, co-operative, appears as stated age, not in any distress. HEENT- Atraumatic, normocephalic, PERRL, EOMI, oral mucosa appears moist. CARDIAC- RRR, no murmurs, rubs or  gallops. RESP- Moving equal volumes of air, and clear to auscultation bilaterally, no wheezes or crackles. NEURO- CN 2-12 intact (?chronic right lip downturning), strength and sensation intact throughout including pinpoint sensation exam of feet; gait narrow, straight, maybe more preference to the left leg; no resting tremor; minimal intention tremor; finger to nose wnl; peripheral visual fields intact; no clonus or cogwheel rigidity EXTREMITIES- pulse 2+, symmetric, no pedal edema. Decreased muscle mass throughout; full ROM un bil LEs. Point tenderness of lower thoracic/upper lumbar spine. SKIN- Warm, dry, no rash or lesion. PSYCH- Normal mood and affect, appropriate thought content and speech.  Assessment & Plan:   See Encounters Tab for problem based charting.   Patient discussed with Dr. Abelardo Diesel, MD Internal Medicine PGY-3

## 2018-01-06 NOTE — Assessment & Plan Note (Addendum)
On exam patient had point tenderness over lower thoracic/upper lumbar spine. In setting of comorbidies concern for compression fracture either from osteoporosis or pathologic.  Plan: --f/u thoracic and lumbar spine xrays  Addendum: Degenerative changes and loss of disc height but negative for fractures.

## 2018-01-07 ENCOUNTER — Ambulatory Visit (HOSPITAL_COMMUNITY)
Admission: RE | Admit: 2018-01-07 | Discharge: 2018-01-07 | Disposition: A | Discharge status: Home / Self Care | Payer: Medicare Other | Source: Ambulatory Visit | Attending: Internal Medicine | Admitting: Internal Medicine

## 2018-01-07 DIAGNOSIS — W19XXXA Unspecified fall, initial encounter: Secondary | ICD-10-CM | POA: Insufficient documentation

## 2018-01-07 DIAGNOSIS — M549 Dorsalgia, unspecified: Secondary | ICD-10-CM | POA: Diagnosis not present

## 2018-01-07 DIAGNOSIS — S3992XA Unspecified injury of lower back, initial encounter: Secondary | ICD-10-CM | POA: Diagnosis not present

## 2018-01-07 DIAGNOSIS — S299XXA Unspecified injury of thorax, initial encounter: Secondary | ICD-10-CM | POA: Diagnosis not present

## 2018-01-07 DIAGNOSIS — M47816 Spondylosis without myelopathy or radiculopathy, lumbar region: Secondary | ICD-10-CM | POA: Insufficient documentation

## 2018-01-07 DIAGNOSIS — D892 Hypergammaglobulinemia, unspecified: Secondary | ICD-10-CM | POA: Insufficient documentation

## 2018-01-07 LAB — TSH: TSH: 3.29 u[IU]/mL (ref 0.450–4.500)

## 2018-01-07 LAB — CBC
HEMOGLOBIN: 10.7 g/dL — AB (ref 13.0–17.7)
Hematocrit: 32.1 % — ABNORMAL LOW (ref 37.5–51.0)
MCH: 31.9 pg (ref 26.6–33.0)
MCHC: 33.3 g/dL (ref 31.5–35.7)
MCV: 96 fL (ref 79–97)
Platelets: 201 10*3/uL (ref 150–450)
RBC: 3.35 x10E6/uL — AB (ref 4.14–5.80)
RDW: 12.8 % (ref 12.3–15.4)
WBC: 3.7 10*3/uL (ref 3.4–10.8)

## 2018-01-07 LAB — VITAMIN B12: Vitamin B-12: 347 pg/mL (ref 232–1245)

## 2018-01-07 LAB — FOLATE RBC
FOLATE, RBC: 967 ng/mL (ref >498)
Folate, Hemolysate: 310.4 ng/mL

## 2018-01-07 LAB — T4, FREE: Free T4: 1.65 ng/dL (ref 0.82–1.77)

## 2018-01-07 LAB — RPR: RPR: NONREACTIVE

## 2018-01-07 LAB — HIV ANTIBODY (ROUTINE TESTING W REFLEX): HIV SCREEN 4TH GENERATION: NONREACTIVE

## 2018-01-08 NOTE — Progress Notes (Signed)
Internal Medicine Clinic Attending  Case discussed with Dr. Svalina  at the time of the visit.  We reviewed the resident's history and exam and pertinent patient test results.  I agree with the assessment, diagnosis, and plan of care documented in the resident's note.  

## 2018-01-19 ENCOUNTER — Telehealth: Payer: Self-pay | Admitting: Internal Medicine

## 2018-01-19 DIAGNOSIS — I739 Peripheral vascular disease, unspecified: Secondary | ICD-10-CM

## 2018-01-19 NOTE — Telephone Encounter (Signed)
Tai, NP with [facility] called. States she performed quantiflow study during AWV which revealed moderate PVD. States this is new dx for patient. Patient had no wounds on LE. Monofilament testing was impaired but patient has calluses on both feet. Recommends f/u ABIs. Full report with findings will be sent to office within 2 weeks. Hubbard Hartshorn, RN, BSN

## 2018-01-21 ENCOUNTER — Telehealth: Payer: Self-pay | Admitting: *Deleted

## 2018-01-21 DIAGNOSIS — H10413 Chronic giant papillary conjunctivitis, bilateral: Secondary | ICD-10-CM | POA: Diagnosis not present

## 2018-01-21 DIAGNOSIS — Z961 Presence of intraocular lens: Secondary | ICD-10-CM | POA: Diagnosis not present

## 2018-01-21 DIAGNOSIS — H04123 Dry eye syndrome of bilateral lacrimal glands: Secondary | ICD-10-CM | POA: Diagnosis not present

## 2018-01-21 DIAGNOSIS — H401133 Primary open-angle glaucoma, bilateral, severe stage: Secondary | ICD-10-CM | POA: Diagnosis not present

## 2018-01-21 NOTE — Telephone Encounter (Signed)
Order for ABI placed.

## 2018-01-21 NOTE — Telephone Encounter (Signed)
CONTACTED THE OFFICE REGARDING PATIENT'S APPOINTMENT. PER OFFICE, THEY ARE BEHIND IN SCHEDULING DUE TO BEING SHORT STAFFED. WILL CONTACT PATIENT WITH APPOINTMENT.

## 2018-01-28 ENCOUNTER — Ambulatory Visit: Payer: Medicare Other | Attending: Internal Medicine | Admitting: Physical Therapy

## 2018-01-28 ENCOUNTER — Encounter: Payer: Self-pay | Admitting: Physical Therapy

## 2018-01-28 ENCOUNTER — Other Ambulatory Visit: Payer: Self-pay

## 2018-01-28 VITALS — BP 134/63 | HR 48

## 2018-01-28 DIAGNOSIS — R296 Repeated falls: Secondary | ICD-10-CM | POA: Diagnosis not present

## 2018-01-28 DIAGNOSIS — R2689 Other abnormalities of gait and mobility: Secondary | ICD-10-CM | POA: Diagnosis not present

## 2018-01-28 DIAGNOSIS — R2681 Unsteadiness on feet: Secondary | ICD-10-CM | POA: Diagnosis not present

## 2018-01-28 DIAGNOSIS — M6281 Muscle weakness (generalized): Secondary | ICD-10-CM | POA: Diagnosis not present

## 2018-01-28 NOTE — Therapy (Signed)
[facility] [address] [place], [place], [address] Phone: [phone]   Fax:  [phone]  Physical Therapy Evaluation  Patient Details  Name: Jared Woodward MRN: [id number] Date of Birth: October 14, 1940 Referring Provider (PT): Alphonzo Grieve, MD   Encounter Date: 01/28/2018  PT End of Session - 01/28/18 1304    Visit Number  1    Number of Visits  13    Date for PT Re-Evaluation  03/14/18    Authorization Type  [employer] Medicare- *Requires 10th visit progress note    PT Start Time  1015    PT Stop Time  1100    PT Time Calculation (min)  45 min    Equipment Utilized During Treatment  Gait belt    Activity Tolerance  Patient tolerated treatment well    Behavior During Therapy  [facility] for tasks assessed/performed       Past Medical History:  Diagnosis Date  . AMAUROSIS FUGAX 12/24/2007  . ANEMIA OF CHRONIC DISEASE 05/08/2006  . ANEMIA, VITAMIN B12 DEFICIENCY NEC 01/12/2007  . GLAUCOMA 05/08/2006  . HYPERTENSION 05/08/2006  . MENTAL RETARDATION 05/08/2006  . 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 PULMONARY NODULE 10/15/2007  . RENAL INSUFFICIENCY 05/08/2006    Past Surgical History:  Procedure Laterality Date  . TONSILLECTOMY  1956    Vitals:   01/28/18 1255  BP: 134/63  Pulse: (!) 48     Subjective Assessment - 01/28/18 1024    Subjective  Pt's cousin reports she told Pt to stop taking Lipitor because it may be causing LE weakness contributing to his falls. Pt reports he can sense when his legs are about to give out and he tends to fall forwards. Pt's cousin reports he has started to fall more frequently this year.     Patient is accompained by:  Family member   Derrill Memo   Pertinent History  HTN, PVD, essential tremor, Amaurosis Fugax, Anemia of chronic disease, Paraproteinemia, Glaucoma, Mental Retardation, Pulmonary Nodule, Renal Insufficiency     Limitations  Standing;Walking    Patient Stated Goals  Pt  reports goal of decreasing falls and increasing LE strength     Currently in Pain?  No/denies         [facility] PT Assessment - 01/28/18 1029      Assessment   Medical Diagnosis  Falls    Referring Provider (PT)  Alphonzo Grieve, MD    Onset Date/Surgical Date  01/06/18    Next MD Visit  February 22, 2018    Prior Therapy  Pt denies prior therapy      Precautions   Precautions  Fall      Restrictions   Weight Bearing Restrictions  No      Balance Screen   Has the patient fallen in the past 6 months  Yes    How many times?  2-3 times    Has the patient had a decrease in activity level because of a fear of falling?   No    Is the patient reluctant to leave their home because of a fear of falling?   No      Home Environment   Living Environment  Private residence    Living Arrangements  Other relatives    Available Help at Discharge  Family    Type of [place] to enter    Entrance Stairs-Number of Steps  3    Entrance Stairs-Rails  Can reach both    Home Layout  One level      Prior Function   Level of Independence  Independent with basic ADLs;Independent with gait;Independent with transfers    Leisure  Pt enjoys riding his bike, exercising, cutting grass, and making airplanes      Cognition   Overall Cognitive Status  History of cognitive impairments - at baseline   2/2 to mental retardation diagnosis     Sensation   Light Touch  Appears Intact      Coordination   Gross Motor Movements are Fluid and Coordinated  Yes    Heel Shin Test  [facility] R=L      ROM / Strength   AROM / PROM / Strength  AROM;Strength      AROM   Overall AROM   Within functional limits for tasks performed      Strength   Overall Strength  Deficits    Overall Strength Comments  Grossly assessed to be 4/5 in sitting during MMT assessment      Transfers   Transfers  Sit to Stand;Stand to Sit;Stand Pivot Transfers    Sit to Stand  5: Supervision    Five time sit to stand  comments   Pt performs 5x STS test in 13.53 seconds from standard height chair with no UE assist    Stand to Sit  5: Supervision    Stand Pivot Transfers  5: Supervision      Ambulation/Gait   Ambulation/Gait  Yes    Ambulation/Gait Assistance  5: Supervision    Ambulation Distance (Feet)  40 Feet    Assistive device  None    Gait Pattern  Step-through pattern;Decreased dorsiflexion - right;Decreased dorsiflexion - left;Decreased trunk rotation;Poor foot clearance - left;Poor foot clearance - right    Ambulation Surface  Level;Indoor    Gait velocity  4.13 ft/sec with no AD indicative of community ambulator       Standardized Balance Assessment   Standardized Balance Assessment  Berg Balance Test      Berg Balance Test   Sit to Stand  Able to stand without using hands and stabilize independently    Standing Unsupported  Able to stand 2 minutes with supervision    Sitting with Back Unsupported but Feet Supported on Floor or Stool  Able to sit safely and securely 2 minutes    Stand to Sit  Sits safely with minimal use of hands    Transfers  Able to transfer safely, minor use of hands    Standing Unsupported with Eyes Closed  Able to stand 10 seconds with supervision    Standing Ubsupported with Feet Together  Able to place feet together independently and stand for 1 minute with supervision    From Standing, Reach Forward with Outstretched Arm  Can reach confidently >25 cm (10")    From Standing Position, Pick up Object from [place] to pick up shoe safely and easily    From Standing Position, Turn to Look Behind Over each Shoulder  Looks behind one side only/other side shows less weight shift    Turn 360 Degrees  Able to turn 360 degrees safely but slowly    Standing Unsupported, Alternately Place Feet on Step/Stool  Able to complete >2 steps/needs minimal assist    Standing Unsupported, One Foot in Front  Able to plae foot ahead of the other independently and hold 30 seconds     Standing on One Leg  Able to lift  leg independently and hold equal to or more than 3 seconds    Total Score  44    Berg comment:  44/56 indicative of significant fall risk potential.                 Objective measurements completed on examination: See above findings.              PT Education - 01/28/18 1303    Education Details  Therapist provided education regarding clinical findings during initial evaluation and POC moving forward working on dynamic balance and strengthening to improve safety with functional mobility.     Person(s) Educated  Patient;Other (comment)   Cousin [name]   Methods  Explanation    Comprehension  Verbalized understanding       PT Short Term Goals - 01/28/18 1305      PT SHORT TERM GOAL #1   Title  Pt will participate in establishment of initial HEP to further improve balance and strength to improve safety with functional mobility     Time  3    Period  Weeks    Status  New    Target Date  02/18/18      PT SHORT TERM GOAL #2   Title  Pt will participate in DGI assessment to establish baseline fall risk potential during dynamic gait with no AD     Time  3    Period  Weeks    Status  New    Target Date  02/18/18      PT SHORT TERM GOAL #3   Title  Pt will ambulate 500 feet with no AD over uneven surfaces, curbs, inclines, 4 steps with single HR, and change gait speed when cued with CGA using no AD to improve safety with community ambulation.     Time  3    Period  Weeks    Status  New    Target Date  02/18/18      PT SHORT TERM GOAL #4   Title  Pt will participate in 6 minute walk test to determine baseline endurance during timed gait assessment with no AD    Time  3    Period  Weeks    Status  New    Target Date  02/18/18      PT SHORT TERM GOAL #5   Title  Pt will improve Berg score from 44/56 to >/= 48/56 to reduce fall risk potential     Baseline  10/3: 44/56 indicative of significant fall risk potential     Time  3     Period  Weeks    Status  New    Target Date  02/18/18        PT Long Term Goals - 01/28/18 1310      PT LONG TERM GOAL #1   Title  Pt will report compliancy with HEP performance under direct supervision from family members for safety consideration to further progress towards improvement with functional mobility.     Time  6    Period  Weeks    Status  New    Target Date  03/14/18      PT LONG TERM GOAL #2   Title  Pt will improve Berg Score to >/=52/56 to reduce fall risk potential and increase safety during balance tasks     Time  6    Period  Weeks    Status  New    Target Date  03/14/18  PT LONG TERM GOAL #3   Title  Pt will improve DGI score to >/=19/24 to reduce fall risk potential during dynamic gait     Time  6    Period  Weeks    Status  New    Target Date  03/14/18      PT LONG TERM GOAL #4   Title  Pt will ambulate 1000 feet navigating uneven terrain, curbs, inclines, 12 steps using single HR, and changing gait speed when cued with supervision and no AD to increase safety with community ambulation.     Time  6    Period  Weeks    Status  New    Target Date  03/14/18      PT LONG TERM GOAL #5   Title  Pt will improve 6 min walk test by 100 feet indicating improvement in endurance during functional mobility with no AD    Time  6    Period  Weeks    Status  New    Target Date  03/14/18          01/28/18 1318  Plan  Clinical Impression Statement Pt is a 77 year old male referred to Neuro OPPT for evaluation for falls. Pt's PMH is significant for the following: HTN, PVD, essential tremor, Amaurosis Fugax, Anemia of chronic disease, Paraproteinemia, Glaucoma, Mental Retardation, Pulmonary Nodule, and Renal Insufficiency. The following deficits were noted during pt's exam: Pt demonstrates 4/5 bilateral LE strength during MMT assessment when grossly assessed in sitting. Pt's gait speed of 4.13 ft/sec with no AD is indicative of community ambulation; however,  patient relies on momentum with observable weight shifted anteriorly and Pt may demonstrate increased difficulty with dynamic challenges during gait. Pt's 5x STS time of 13.53 seconds indicates patient is not at a high risk for falls; however, Pt's Berg score of 44/56 indicates he is a significant fall risk 2/2 to increased challenge observed with narrow BOS and removing visual input . Pt would benefit from skilled PT to address these impairments and functional limitations to maximize functional mobility independence and reduce falls risk.  History and Personal Factors relevant to plan of care:  PVD, HTN, essential tremor, Amaurosis Fugax, Anemia of chronic disease, Paraproteinemia, Glaucoma, Mental Retardation, Pulmonary Nodule, Renal Insufficiency. Pt demonstrates increased reliance on family for medical management, transportation, and reading which may effect HEP. Newly diagnosed with PVD, unexplained wt loss and low appetite.    Clinical Presentation Evolving  Clinical Presentation due to: PVD, HTN, essential tremor, Amaurosis Fugax, Anemia of chronic disease, Paraproteinemia, Glaucoma, Mental Retardation, Pulmonary Nodule, Renal Insufficiency. Pt demonstrates increased reliance on family for medical management, transportation, and reading which may effect HEP. Newly diagnosed with PVD, unexplained wt loss and low appetite.    Clinical Decision Making Moderate  Pt will benefit from skilled therapeutic intervention in order to improve on the following deficits Abnormal gait;Difficulty walking;Decreased balance;Decreased strength;Decreased knowledge of use of DME  Rehab Potential Good  Clinical Impairments Affecting Rehab Potential New diagnosis of PVD & past medical history of mental retardation which may affect HEP compliance  PT Frequency 2x / week  PT Duration 6 weeks  PT Treatment/Interventions ADLs/Self Care Home Management;Stair training;Patient/family education;Functional mobility  training;Therapeutic activities;Therapeutic exercise;Balance training;Neuromuscular re-education;Electrical Stimulation;DME Instruction;Gait training  PT Next Visit Plan DGI, 6 min walk test, establish HEP, reassess for any falls, high level balance with dual tasks, strengthening   PT Home Exercise Plan tbd  Consulted and Agree with Plan of Care Patient;Family  member/caregiver  Family Member [place]       Patient will benefit from skilled therapeutic intervention in order to improve the following deficits and impairments:  Abnormal gait, Difficulty walking, Decreased balance, Decreased strength, Decreased knowledge of use of DME  Visit Diagnosis: Other abnormalities of gait and mobility  Repeated falls  Muscle weakness (generalized)  Unsteadiness on feet     Problem List Patient Active Problem List   Diagnosis Date Noted  . Fall 01/06/2018  . Tenderness over spine 01/06/2018  . HLD (hyperlipidemia) 08/17/2017  . Asthma 04/14/2016  . Nocturnal leg cramps 10/20/2015  . Allergic rhinitis 10/20/2015  . Cerumen impaction 07/06/2015  . Healthcare maintenance 01/22/2015  . Osteoarthritis of both knees 09/17/2013  . Benign essential tremor 08/31/2012  . Carpal tunnel syndrome 08/31/2012  . AMAUROSIS FUGAX 12/24/2007  . 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 Pulmonary nodule 10/15/2007  . Anemia of chronic disease 05/08/2006  . MENTAL RETARDATION 05/08/2006  . GLAUCOMA 05/08/2006  . Essential hypertension, benign 05/08/2006  . Chronic kidney disease (CKD), stage IV (severe) ([place]) 05/08/2006    Floreen Comber, SPT 01/28/2018, 1:33 PM  [facility] [address] [place], [place], [address] Phone: [phone]   Fax:  [phone]  Name: Jared Woodward MRN: [id number] Date of Birth: 10-Apr-1941

## 2018-01-29 ENCOUNTER — Ambulatory Visit (HOSPITAL_COMMUNITY)
Admission: RE | Admit: 2018-01-29 | Discharge: 2018-01-29 | Disposition: A | Discharge status: Home / Self Care | Payer: Medicare Other | Source: Ambulatory Visit | Attending: Internal Medicine | Admitting: Internal Medicine

## 2018-01-29 DIAGNOSIS — I739 Peripheral vascular disease, unspecified: Secondary | ICD-10-CM | POA: Insufficient documentation

## 2018-01-29 NOTE — Progress Notes (Signed)
VASCULAR LAB PRELIMINARY  ARTERIAL  ABI completed:   Right: Resting right ankle-brachial index is within normal range. No evidence of significant right lower extremity arterial disease. The right toe-brachial index is normal.   Left: Resting left ankle-brachial index is within normal range. No evidence of significant left lower extremity arterial disease. The left toe-brachial index is abnormal.     RIGHT    LEFT    PRESSURE WAVEFORM  PRESSURE WAVEFORM  BRACHIAL 123 Biphasic BRACHIAL 119 Biphasic  DP 123 Biphasic DP 128 Biphasic  PT 131 Biphasic PT 116 Biphasic  GREAT TOE 95  GREAT TOE 73     RIGHT LEFT  ABI/TBI 1.07/0.77 1.04/0.59     Rudell Cobb, RVT, RDMS 01/29/2018, 10:59 AM

## 2018-02-15 DIAGNOSIS — N184 Chronic kidney disease, stage 4 (severe): Secondary | ICD-10-CM | POA: Diagnosis not present

## 2018-02-15 DIAGNOSIS — N2581 Secondary hyperparathyroidism of renal origin: Secondary | ICD-10-CM | POA: Diagnosis not present

## 2018-02-16 ENCOUNTER — Ambulatory Visit: Payer: Medicare Other | Admitting: Physical Therapy

## 2018-02-16 ENCOUNTER — Encounter: Payer: Self-pay | Admitting: Physical Therapy

## 2018-02-16 DIAGNOSIS — R2689 Other abnormalities of gait and mobility: Secondary | ICD-10-CM | POA: Diagnosis not present

## 2018-02-16 DIAGNOSIS — R296 Repeated falls: Secondary | ICD-10-CM | POA: Diagnosis not present

## 2018-02-16 DIAGNOSIS — R2681 Unsteadiness on feet: Secondary | ICD-10-CM | POA: Diagnosis not present

## 2018-02-16 DIAGNOSIS — M6281 Muscle weakness (generalized): Secondary | ICD-10-CM | POA: Diagnosis not present

## 2018-02-16 NOTE — Therapy (Signed)
[facility] [address] [place], [place], [address] Phone: [phone]   Fax:  [phone]  Physical Therapy Treatment  Patient Details  Name: Jared Woodward MRN: [id number] Date of Birth: 1940/08/01 Referring Provider (PT): Alphonzo Grieve, MD   Encounter Date: 02/16/2018  PT End of Session - 02/16/18 1455    Visit Number  2    Number of Visits  13    Date for PT Re-Evaluation  03/14/18    Authorization Type  [employer] Medicare- *Requires 10th visit progress note.  Medicaid secondary    PT Start Time  1400    PT Stop Time  1445    PT Time Calculation (min)  45 min    Equipment Utilized During Treatment  Gait belt    Activity Tolerance  Patient tolerated treatment well    Behavior During Therapy  WFL for tasks assessed/performed       Past Medical History:  Diagnosis Date  . AMAUROSIS FUGAX 12/24/2007  . ANEMIA OF CHRONIC DISEASE 05/08/2006  . ANEMIA, VITAMIN B12 DEFICIENCY NEC 01/12/2007  . GLAUCOMA 05/08/2006  . HYPERTENSION 05/08/2006  . MENTAL RETARDATION 05/08/2006  . 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 PULMONARY NODULE 10/15/2007  . RENAL INSUFFICIENCY 05/08/2006    Past Surgical History:  Procedure Laterality Date  . TONSILLECTOMY  1956    There were no vitals filed for this visit.  Subjective Assessment - 02/16/18 1407    Subjective  Reports that if he stands up too quickly he tends to fall forwards. Reports experiencing no falls. Pt's cousin reports he has a follow up appt. on 10/28 and will find out if Lipitor is contributing to his LE weakness.     Patient is accompained by:  Family member   Derrill Memo   Pertinent History  HTN, PVD, essential tremor, Amaurosis Fugax, Anemia of chronic disease, Paraproteinemia, Glaucoma, Mental Retardation, Pulmonary Nodule, Renal Insufficiency     Limitations  Standing;Walking    Patient Stated Goals  Pt reports goal of decreasing falls and increasing LE strength      Currently in Pain?  No/denies         [facility] PT Assessment - 02/16/18 1411      6 Minute Walk- Baseline   6 Minute Walk- Baseline  yes    BP (mmHg)  125/82    HR (bpm)  62    02 Sat (%RA)  98 %    Modified Borg Scale for Dyspnea  0- Nothing at all    Perceived Rate of Exertion (Borg)  7- Very, very light      6 Minute walk- Post Test   6 Minute Walk Post Test  yes    BP (mmHg)  138/82    HR (bpm)  52   fingers cold may impact true reading   02 Sat (%RA)  98 %    Modified Borg Scale for Dyspnea  1- Very mild shortness of breath    Perceived Rate of Exertion (Borg)  13- Somewhat hard      6 minute walk test results    Aerobic Endurance Distance Walked  1227      Standardized Balance Assessment   Standardized Balance Assessment  Dynamic Gait Index      Dynamic Gait Index   Level Surface  Mild Impairment    Change in Gait Speed  Mild Impairment    Gait with Horizontal Head Turns  Mild Impairment    Gait with Vertical Head Turns  Moderate Impairment    Gait and Pivot Turn  Normal    Step Over Obstacle  Mild Impairment    Step Around Obstacles  Mild Impairment    Steps  Mild Impairment    Total Score  16    DGI comment:  16/24 indicative of high fall risk       Skilled Physical Therapy Intervention:  Patient verbalizes and demonstrates understanding of the below corner balance HEP exercises with proper technique to improve safety with functional mobility.    Exercises  Romberg Stance with Eyes Closed - 3 sets - 30 hold - 1x daily - 5x weekly  Half Tandem Stance Balance with Head Nods - 3-5 reps - 1 sets - 30 hold - 1x daily - 7x weekly  Romberg Stance on Foam Pad - 3-5 reps - 1 sets - 30 hold - 1x daily - 7x weekly     [facility] Adult PT Treatment/Exercise - 02/16/18 1452      Ambulation/Gait   Ambulation/Gait  Yes    Ambulation/Gait Assistance  5: Supervision    Ambulation Distance (Feet)  1227 Feet    Assistive device  None    Gait Pattern  Step-through  pattern;Decreased dorsiflexion - right;Decreased dorsiflexion - left;Decreased trunk rotation;Poor foot clearance - left;Poor foot clearance - right    Ambulation Surface  Level;Indoor    Stairs  Yes    Stairs Assistance  5: Supervision    Stair Management Technique  Two rails;Alternating pattern    Number of Stairs  4    Height of Stairs  6             PT Education - 02/16/18 1454    Education Details  Therapist provided education regarding clinical findings for 6 min walk test and DGI. Therapist initiated corner balance HEP with patient verbalizing and demonstrating understanding.     Person(s) Educated  Patient;Other (comment)   cousin: Deneise Lever   Methods  Explanation;Demonstration    Comprehension  Verbalized understanding;Returned demonstration       PT Short Term Goals - 02/16/18 1456      PT SHORT TERM GOAL #1   Title  Pt will participate in establishment of initial HEP to further improve balance and strength to improve safety with functional mobility     Time  3    Period  Weeks    Status  New      PT SHORT TERM GOAL #2   Title  Pt will participate in DGI assessment to establish baseline fall risk potential during dynamic gait with no AD     Baseline  10/22: 16/24 indicative of high fall risk potential     Time  3    Period  Weeks    Status  Achieved      PT SHORT TERM GOAL #3   Title  Pt will ambulate 500 feet with no AD over uneven surfaces, curbs, inclines, 4 steps with single HR, and change gait speed when cued with CGA using no AD to improve safety with community ambulation.     Time  3    Period  Weeks    Status  New      PT SHORT TERM GOAL #4   Title  Pt will participate in 6 minute walk test to determine baseline endurance during timed gait assessment with no AD    Baseline  10/22: 1227 feet with no AD    Time  3    Period  Weeks  Status  Achieved      PT SHORT TERM GOAL #5   Title  Pt will improve Berg score from 44/56 to >/= 48/56 to reduce fall  risk potential     Baseline  10/3: 44/56 indicative of significant fall risk potential     Time  3    Period  Weeks    Status  New        PT Long Term Goals - 01/28/18 1310      PT LONG TERM GOAL #1   Title  Pt will report compliancy with HEP performance under direct supervision from family members for safety consideration to further progress towards improvement with functional mobility.     Time  6    Period  Weeks    Status  New    Target Date  03/14/18      PT LONG TERM GOAL #2   Title  Pt will improve Berg Score to >/=52/56 to reduce fall risk potential and increase safety during balance tasks     Time  6    Period  Weeks    Status  New    Target Date  03/14/18      PT LONG TERM GOAL #3   Title  Pt will improve DGI score to >/=19/24 to reduce fall risk potential during dynamic gait     Time  6    Period  Weeks    Status  New    Target Date  03/14/18      PT LONG TERM GOAL #4   Title  Pt will ambulate 1000 feet navigating uneven terrain, curbs, inclines, 12 steps using single HR, and changing gait speed when cued with supervision and no AD to increase safety with community ambulation.     Time  6    Period  Weeks    Status  New    Target Date  03/14/18      PT LONG TERM GOAL #5   Title  Pt will improve 6 min walk test by 100 feet indicating improvement in endurance during functional mobility with no AD    Time  6    Period  Weeks    Status  New    Target Date  03/14/18            Plan - 02/16/18 1457    Clinical Impression Statement  Today's skilled session focused on assessing dynamic gait index, 6 minute walk test for aerobic capacity, and establishment of corner balance HEP. Pt tolerated session well and ambulated 1227 feet during 6 minute walk test with no AD when assessed. Pt is considered high fall risk potential indicated by DGI score of 16/24. Pt verbalizes and demonstrates understanding of initial corner balance HEP to perform at home with proper  technique. Pt will continue to benefit from skilled PT to address balance, strength, and functional mobility deficits.     Rehab Potential  Good    Clinical Impairments Affecting Rehab Potential  New diagnosis of PVD & past medical history of mental retardation which may affect HEP compliance    PT Frequency  2x / week    PT Duration  6 weeks    PT Treatment/Interventions  ADLs/Self Care Home Management;Stair training;Patient/family education;Functional mobility training;Therapeutic activities;Therapeutic exercise;Balance training;Neuromuscular re-education;Electrical Stimulation;DME Instruction;Gait training    PT Next Visit Plan  strengthening to HEP, high level balance with dual tasks, rocker board for falls forwards (stepping, anterior pertubations, balance strategies), hurdles/ cone weaving/ compliant surface.  Balance on compliant surfaces with eyes closed, fall recovery, step ups.     PT Home Exercise Plan  [id number]     Consulted and Agree with Plan of Care  Patient;Family member/caregiver    Family Member Consulted  Cousin- Deneise Lever        Patient will benefit from skilled therapeutic intervention in order to improve the following deficits and impairments:  Abnormal gait, Difficulty walking, Decreased balance, Decreased strength, Decreased knowledge of use of DME  Visit Diagnosis: Other abnormalities of gait and mobility  Unsteadiness on feet     Problem List Patient Active Problem List   Diagnosis Date Noted  . Fall 01/06/2018  . Tenderness over spine 01/06/2018  . HLD (hyperlipidemia) 08/17/2017  . Asthma 04/14/2016  . Nocturnal leg cramps 10/20/2015  . Allergic rhinitis 10/20/2015  . Cerumen impaction 07/06/2015  . Healthcare maintenance 01/22/2015  . Osteoarthritis of both knees 09/17/2013  . Benign essential tremor 08/31/2012  . Carpal tunnel syndrome 08/31/2012  . AMAUROSIS FUGAX 12/24/2007  . 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 Pulmonary nodule 10/15/2007  .  Anemia of chronic disease 05/08/2006  . MENTAL RETARDATION 05/08/2006  . GLAUCOMA 05/08/2006  . Essential hypertension, benign 05/08/2006  . Chronic kidney disease (CKD), stage IV (severe) ([place]) 05/08/2006    Floreen Comber, SPT 02/16/2018, 3:03 PM  [facility] [address] [place], [place], [address] Phone: [phone]   Fax:  [phone]  Name: Jared Woodward MRN: [id number] Date of Birth: [birth year]

## 2018-02-16 NOTE — Patient Instructions (Signed)
Access Code: [email or web address]  URL: [email or web address]  Date: 02/16/2018  Prepared by: Floreen Comber   Exercises  Romberg Stance with Eyes Closed - 3 sets - 30 hold - 1x daily - 5x weekly  Half Tandem Stance Balance with Head Nods - 3-5 reps - 1 sets - 30 hold - 1x daily - 7x weekly  Romberg Stance on Foam Pad - 3-5 reps - 1 sets - 30 hold - 1x daily - 7x weekly

## 2018-02-18 ENCOUNTER — Ambulatory Visit: Payer: Medicare Other | Admitting: Physical Therapy

## 2018-02-18 ENCOUNTER — Encounter: Payer: Self-pay | Admitting: Physical Therapy

## 2018-02-18 DIAGNOSIS — R2681 Unsteadiness on feet: Secondary | ICD-10-CM

## 2018-02-18 DIAGNOSIS — R296 Repeated falls: Secondary | ICD-10-CM | POA: Diagnosis not present

## 2018-02-18 DIAGNOSIS — R2689 Other abnormalities of gait and mobility: Secondary | ICD-10-CM

## 2018-02-18 DIAGNOSIS — M6281 Muscle weakness (generalized): Secondary | ICD-10-CM | POA: Diagnosis not present

## 2018-02-18 NOTE — Therapy (Signed)
[facility] [address] [place], [place], [address] Phone: [phone]   Fax:  [phone]  Physical Therapy Treatment  Patient Details  Name: Jared Woodward MRN: [id number] Date of Birth: 06-04-1940 Referring Provider (PT): Alphonzo Grieve, MD   Encounter Date: 02/18/2018  PT End of Session - 02/18/18 1507    Visit Number  3    Number of Visits  13    Date for PT Re-Evaluation  03/14/18    Authorization Type  [employer] Medicare- *Requires 10th visit progress note.  Medicaid secondary    PT Start Time  1400    PT Stop Time  1445    PT Time Calculation (min)  45 min    Equipment Utilized During Treatment  Gait belt    Activity Tolerance  Patient tolerated treatment well    Behavior During Therapy  WFL for tasks assessed/performed       Past Medical History:  Diagnosis Date  . AMAUROSIS FUGAX 12/24/2007  . ANEMIA OF CHRONIC DISEASE 05/08/2006  . ANEMIA, VITAMIN B12 DEFICIENCY NEC 01/12/2007  . GLAUCOMA 05/08/2006  . HYPERTENSION 05/08/2006  . MENTAL RETARDATION 05/08/2006  . 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 PULMONARY NODULE 10/15/2007  . RENAL INSUFFICIENCY 05/08/2006    Past Surgical History:  Procedure Laterality Date  . TONSILLECTOMY  1956    There were no vitals filed for this visit.  Subjective Assessment - 02/18/18 1406    Subjective  Pt reports he has not experienced any falls. Reports he has not been compliant with performing HEP balance exercises at home. Continues to experience sensation of falling forwards when performing sit to stands.     Patient is accompained by:  Family member   Derrill Memo   Pertinent History  HTN, PVD, essential tremor, Amaurosis [place], Anemia of chronic disease, Paraproteinemia, Glaucoma, Mental Retardation, Pulmonary Nodule, Renal Insufficiency     Limitations  Standing;Walking    Patient Stated Goals  Pt reports goal of decreasing falls and increasing LE strength      Currently in Pain?  No/denies                       [facility] Adult PT Treatment/Exercise - 02/18/18 1452      Transfers   Transfers  Sit to Stand;Stand to [employer] Transfers    Sit to Stand  5: Supervision    Stand to Sit  5: Supervision    Stand Pivot Transfers  5: Supervision    Comments  Pt performs multiple trials of STS from standard height chair positioned facing the ramp on incline. Pt demonstrates excessive anterior weight shift during standing, weightbearing through his toes causing forward LOB. Therapist cued for proper technique with patient able to demonstrate; however, patient will continue to benefit from STS to progress towards achieving proper technique and safety.        Balance   Balance Assessed  Yes      Dynamic Standing Balance   Compliant surfaces comments:  Pt performs standing balance on foam surface for 30 second intervals consisting of; feet apart with eyes open; feet apart with head turns in all directions; feet apart with eyes closed; feet together with eyes open; feet together with eyes closed; feet together with head turns in all directions. Pt demonstrates increased difficulty with eyes closed 2/2 to increased vestibular challenge. Pt then performs romberg stance with dynamic UE movements holding 4# wt for increased challenge. Pt performs alternating LE  cone tapping on compliant surface demonstrating increased challenge with SLS. Pt demonstrated improvement with performing same exercise on level surface with therapist providing manual pelvic stabilization.      Foam balance beam comments:  Pt performs side stepping on balance beam for x2 trials with no UE assist and therapist cueing for motor control during dynamic standing. Pt then attempts to perform tandem walking on beam with intermittant UE assist; however, patient unable to perform for >2 steps before LOB. Pt then holds tandem stance on foam for 20 sec duration. Pt then performs alternating  forward stepping off beam with no UE assist to begin working on ankle, hip, and stepping strategy. Therapist then performs multiple spontaneous external pertubations with patient demonstrating proper utilization of balance strategies.              PT Education - 02/18/18 1506    Education Details  Therapist provided education regarding importance of patient performing HEP at home to reduce fall risk potential. Patient verbalizes understanding and reports he will try to be more compliant with his exercises.     Person(s) Educated  Patient    Methods  Explanation    Comprehension  Verbalized understanding       PT Short Term Goals - 02/18/18 1953      PT SHORT TERM GOAL #1   Title  Pt will participate in establishment of initial HEP to further improve balance and strength to improve safety with functional mobility     Time  3    Period  Weeks    Status  New    Target Date  02/18/18      PT SHORT TERM GOAL #2   Title  Pt will participate in DGI assessment to establish baseline fall risk potential during dynamic gait with no AD     Baseline  10/22: 16/24 indicative of high fall risk potential     Time  3    Period  Weeks    Status  Achieved    Target Date  02/18/18      PT SHORT TERM GOAL #3   Title  Pt will ambulate 500 feet with no AD over uneven surfaces, curbs, inclines, 4 steps with single HR, and change gait speed when cued with CGA using no AD to improve safety with community ambulation.     Time  3    Period  Weeks    Status  New    Target Date  02/18/18      PT SHORT TERM GOAL #4   Title  Pt will participate in 6 minute walk test to determine baseline endurance during timed gait assessment with no AD    Baseline  10/22: 1227 feet with no AD    Time  3    Period  Weeks    Status  Achieved    Target Date  02/18/18      PT SHORT TERM GOAL #5   Title  Pt will improve Berg score from 44/56 to >/= 48/56 to reduce fall risk potential     Baseline  10/3: 44/56  indicative of significant fall risk potential     Time  3    Period  Weeks    Status  New    Target Date  02/18/18        PT Long Term Goals - 01/28/18 1310      PT LONG TERM GOAL #1   Title  Pt will report compliancy with HEP performance  under direct supervision from family members for safety consideration to further progress towards improvement with functional mobility.     Time  6    Period  Weeks    Status  New    Target Date  03/14/18      PT LONG TERM GOAL #2   Title  Pt will improve Berg Score to >/=52/56 to reduce fall risk potential and increase safety during balance tasks     Time  6    Period  Weeks    Status  New    Target Date  03/14/18      PT LONG TERM GOAL #3   Title  Pt will improve DGI score to >/=19/24 to reduce fall risk potential during dynamic gait     Time  6    Period  Weeks    Status  New    Target Date  03/14/18      PT LONG TERM GOAL #4   Title  Pt will ambulate 1000 feet navigating uneven terrain, curbs, inclines, 12 steps using single HR, and changing gait speed when cued with supervision and no AD to increase safety with community ambulation.     Time  6    Period  Weeks    Status  New    Target Date  03/14/18      PT LONG TERM GOAL #5   Title  Pt will improve 6 min walk test by 100 feet indicating improvement in endurance during functional mobility with no AD    Time  6    Period  Weeks    Status  New    Target Date  03/14/18            Plan - 02/18/18 1508    Clinical Impression Statement  Today's skilled session focused on high level balance with implementation of stepping strategy to improve patient's safety during functional mobility. Pt demonstrates increased challenge performing balancing exercises on compliant surface with dynamic UE movements. Pt tolerated session well and demonstrated improvement in ability to perform stepping strategy to maintain balance with dec reliance on UE's for stabilizing. Patient will continue to  benefit from skilled PT to address strength, balance, and functional mobility deficits.     Rehab Potential  Good    Clinical Impairments Affecting Rehab Potential  New diagnosis of PVD & past medical history of mental retardation which may affect HEP compliance    PT Frequency  2x / week    PT Duration  6 weeks    PT Treatment/Interventions  ADLs/Self Care Home Management;Stair training;Patient/family education;Functional mobility training;Therapeutic activities;Therapeutic exercise;Balance training;Neuromuscular re-education;Electrical Stimulation;DME Instruction;Gait training    PT Next Visit Plan  CHECK STG!!!  strengthening to HEP, STS with band around pelvis to promote hip extension and dec reliance on toes during stand, and band resistance in front with patient pulling back during stands,  high level balance with dual tasks, rocker board for falls forwards (stepping, anterior pertubations, balance strategies), hurdles/ cone weaving/ compliant surface. Balance on compliant surfaces with eyes closed, fall recovery, step ups.     PT Home Exercise Plan  [email or web address]     Consulted and Agree with Plan of Care  Patient;Family member/caregiver    Family Member Consulted  Cousin- Deneise Lever        Patient will benefit from skilled therapeutic intervention in order to improve the following deficits and impairments:  Abnormal gait, Difficulty walking, Decreased balance, Decreased strength, Decreased knowledge of use of DME  Visit Diagnosis: Unsteadiness on feet  Other abnormalities of gait and mobility     Problem List Patient Active Problem List   Diagnosis Date Noted  . Fall 01/06/2018  . Tenderness over spine 01/06/2018  . HLD (hyperlipidemia) 08/17/2017  . Asthma 04/14/2016  . Nocturnal leg cramps 10/20/2015  . Allergic rhinitis 10/20/2015  . Cerumen impaction 07/06/2015  . Healthcare maintenance 01/22/2015  . Osteoarthritis of both knees 09/17/2013  . Benign essential tremor 08/31/2012  .  Carpal tunnel syndrome 08/31/2012  . AMAUROSIS FUGAX 12/24/2007  . 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 Pulmonary nodule 10/15/2007  . Anemia of chronic disease 05/08/2006  . MENTAL RETARDATION 05/08/2006  . GLAUCOMA 05/08/2006  . Essential hypertension, benign 05/08/2006  . Chronic kidney disease (CKD), stage IV (severe) ([place]) 05/08/2006    Floreen Comber, SPT 02/18/2018, 7:56 PM  [facility] [address] [place], [place], [address] Phone: [phone]   Fax:  [phone]  Name: AUBERT CHOYCE MRN: [id number] Date of Birth: 04-03-1941

## 2018-02-22 ENCOUNTER — Other Ambulatory Visit: Payer: Self-pay

## 2018-02-22 ENCOUNTER — Ambulatory Visit (INDEPENDENT_AMBULATORY_CARE_PROVIDER_SITE_OTHER): Payer: Medicare Other | Admitting: Internal Medicine

## 2018-02-22 ENCOUNTER — Encounter: Payer: Self-pay | Admitting: Internal Medicine

## 2018-02-22 VITALS — BP 128/66 | HR 62 | Temp 97.7°F | Ht 68.0 in | Wt 125.1 lb

## 2018-02-22 DIAGNOSIS — E559 Vitamin D deficiency, unspecified: Secondary | ICD-10-CM | POA: Diagnosis not present

## 2018-02-22 DIAGNOSIS — J301 Allergic rhinitis due to pollen: Secondary | ICD-10-CM

## 2018-02-22 DIAGNOSIS — W19XXXD Unspecified fall, subsequent encounter: Secondary | ICD-10-CM | POA: Diagnosis not present

## 2018-02-22 DIAGNOSIS — E538 Deficiency of other specified B group vitamins: Secondary | ICD-10-CM

## 2018-02-22 DIAGNOSIS — Z23 Encounter for immunization: Secondary | ICD-10-CM | POA: Diagnosis not present

## 2018-02-22 DIAGNOSIS — D649 Anemia, unspecified: Secondary | ICD-10-CM

## 2018-02-22 DIAGNOSIS — J309 Allergic rhinitis, unspecified: Secondary | ICD-10-CM

## 2018-02-22 DIAGNOSIS — M79606 Pain in leg, unspecified: Secondary | ICD-10-CM | POA: Diagnosis not present

## 2018-02-22 DIAGNOSIS — R531 Weakness: Secondary | ICD-10-CM

## 2018-02-22 DIAGNOSIS — G250 Essential tremor: Secondary | ICD-10-CM | POA: Diagnosis not present

## 2018-02-22 DIAGNOSIS — Z79899 Other long term (current) drug therapy: Secondary | ICD-10-CM

## 2018-02-22 DIAGNOSIS — Z9181 History of falling: Secondary | ICD-10-CM

## 2018-02-22 DIAGNOSIS — E785 Hyperlipidemia, unspecified: Secondary | ICD-10-CM

## 2018-02-22 MED ORDER — ROSUVASTATIN CALCIUM 10 MG PO TABS: 10.0000 mg | ORAL_TABLET | Freq: Every day | ORAL | 11 refills | Status: DC

## 2018-02-22 MED ORDER — CETIRIZINE HCL 10 MG PO TABS: ORAL_TABLET | ORAL | 5 refills | Status: DC

## 2018-02-22 MED ORDER — PROPRANOLOL HCL 40 MG PO TABS: 40.0000 mg | ORAL_TABLET | Freq: Two times a day (BID) | ORAL | 5 refills | Status: DC

## 2018-02-22 NOTE — Progress Notes (Signed)
Internal Medicine Clinic Attending  Case discussed with Dr. Guilloud at the time of the visit.  We reviewed the resident's history and exam and pertinent patient test results.  I agree with the assessment, diagnosis, and plan of care documented in the resident's note.  

## 2018-02-22 NOTE — Assessment & Plan Note (Signed)
Patient has a history of recurrent falls. He is a poor historian and it is hard to illicit whether it is an issues of weakness or imbalance. He reports he is ok when he stands but when he goes walk he sometimes just falls. Prior work up with RPR, B12, folate, TSH, and free T4 were unremarkable. He was referred to PT and has been going multiple times a week. Family member reports he has not fallen since starting PT. Encouraged patient to continue therapy. Will also check a CK to ensure there is not a statin myopathy contributing given his leg pain. -- Continue PT -- Follow up CK

## 2018-02-22 NOTE — Patient Instructions (Addendum)
Mr. Heathman,  It was a pleasure to see you again. I am sorry to hear you are having a hard time with falling. I have changed your cholesterol medicine from atorvastatin to rosuvastatin (crestor), which tends to cause less side effects for people. I will call you with the results of your blood work today. I have refilled your propanolol and allergy medicine. Follow up with me again in 6 months months or sooner if you have any issues. If you have any questions or concerns, call our clinic at [phone] or after hours call [phone] and ask for the internal medicine resident on call. Thank you!  Dr. Philipp Ovens

## 2018-02-22 NOTE — Assessment & Plan Note (Addendum)
Refilled propanolol 40 BID.

## 2018-02-22 NOTE — Progress Notes (Signed)
   CC: Leg pain  HPI:  [name] is a 77 y.o. male with past medical history outlined below here for leg pain. For the details of today's visit, please refer to the assessment and plan.  Past Medical History:  Diagnosis Date  . AMAUROSIS FUGAX 12/24/2007  . ANEMIA OF CHRONIC DISEASE 05/08/2006  . ANEMIA, VITAMIN B12 DEFICIENCY NEC 01/12/2007  . GLAUCOMA 05/08/2006  . HYPERTENSION 05/08/2006  . MENTAL RETARDATION 05/08/2006  . 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 PULMONARY NODULE 10/15/2007  . RENAL INSUFFICIENCY 05/08/2006    Review of Systems  Respiratory: Negative for shortness of breath.   Cardiovascular: Negative for chest pain.  Musculoskeletal: Negative for falls and myalgias.    Physical Exam:  Vitals:   02/22/18 1319  BP: 128/66  Pulse: 62  Temp: 97.7 F (36.5 C)  TempSrc: Oral  SpO2: 100%  Weight: 125 lb 1.6 oz (56.7 kg)  Height: 5\' 8"  (1.727 m)    Constitutional: NAD, appears comfortable Cardiovascular:RRR, no murmurs, rubs, or gallops.  Pulmonary/Chest:CTAB Extremities: Warm and well perfused, no edema. No skin changes. Psychiatric:Normal mood and affect  Assessment & Plan:   See Encounters Tab for problem based charting.  Patient discussed with Dr. Lynnae January

## 2018-02-22 NOTE — Assessment & Plan Note (Addendum)
Taking OTC vit D supplements. Level last checked 2014 was low at 20. -- Repeat Vit D level   ADDENDUM: Vitamin D within normal limits. Continue OTC supplements.

## 2018-02-22 NOTE — Assessment & Plan Note (Addendum)
Patient was started on atorvastatin 10 mg daily for an elevated ASCVD risk of 23.5%. Today, he is complaining of leg pains that he thinks are secondary to atorvastatin. He previously stopped the medicine and pain resolved. He since restarted and the pain has come back. Patient is a very poor history but pain does not sound muscular in nature, mostly complaining of knee pain?. Given his recent issues falls and feeling unsteady / weak on his feet, will check a CK and change to crestor, which will hopefully be better tolerated. If CK is elevated, would have a low threshold to stop statins altogether given his weakness and falls.  -- Stop atorvastatin, start crestor 10 mg daily -- Follow up CK    ADDENDUM: CK normal, called care giver, left voicemail with results.

## 2018-02-22 NOTE — Assessment & Plan Note (Signed)
Refilled cetirizine. 

## 2018-02-23 ENCOUNTER — Ambulatory Visit: Payer: Medicare Other | Admitting: Physical Therapy

## 2018-02-23 ENCOUNTER — Encounter: Payer: Self-pay | Admitting: Physical Therapy

## 2018-02-23 ENCOUNTER — Telehealth: Payer: Self-pay | Admitting: *Deleted

## 2018-02-23 VITALS — HR 62

## 2018-02-23 DIAGNOSIS — M6281 Muscle weakness (generalized): Secondary | ICD-10-CM | POA: Diagnosis not present

## 2018-02-23 DIAGNOSIS — R2681 Unsteadiness on feet: Secondary | ICD-10-CM

## 2018-02-23 DIAGNOSIS — R2689 Other abnormalities of gait and mobility: Secondary | ICD-10-CM | POA: Diagnosis not present

## 2018-02-23 DIAGNOSIS — G250 Essential tremor: Secondary | ICD-10-CM

## 2018-02-23 DIAGNOSIS — N184 Chronic kidney disease, stage 4 (severe): Secondary | ICD-10-CM | POA: Diagnosis not present

## 2018-02-23 DIAGNOSIS — N2581 Secondary hyperparathyroidism of renal origin: Secondary | ICD-10-CM | POA: Diagnosis not present

## 2018-02-23 DIAGNOSIS — R296 Repeated falls: Secondary | ICD-10-CM | POA: Diagnosis not present

## 2018-02-23 DIAGNOSIS — D631 Anemia in chronic kidney disease: Secondary | ICD-10-CM | POA: Diagnosis not present

## 2018-02-23 DIAGNOSIS — I129 Hypertensive chronic kidney disease with stage 1 through stage 4 chronic kidney disease, or unspecified chronic kidney disease: Secondary | ICD-10-CM | POA: Diagnosis not present

## 2018-02-23 LAB — CK: Total CK: 56 U/L (ref 24–204)

## 2018-02-23 LAB — VITAMIN B12: VITAMIN B 12: 272 pg/mL (ref 232–1245)

## 2018-02-23 LAB — VITAMIN D 25 HYDROXY (VIT D DEFICIENCY, FRACTURES): VIT D 25 HYDROXY: 89.7 ng/mL (ref 30.0–100.0)

## 2018-02-23 NOTE — Telephone Encounter (Signed)
Received call from [place] at [facility] regarding Rx for Propanolol sent yesterday. There are 2 sets of directions. Please resend Rx with desired directions. Thanks! L. Silvano Rusk, RN, BSN

## 2018-02-23 NOTE — Therapy (Signed)
[facility] [address] [place], [place], [address] Phone: [phone]   Fax:  [phone]  Physical Therapy Treatment  Patient Details  Name: Jared Woodward MRN: [id number] Date of Birth: 1940-10-18 Referring Provider (PT): Alphonzo Grieve, MD   Encounter Date: 02/23/2018  PT End of Session - 02/23/18 1557    Visit Number  4    Number of Visits  13    Date for PT Re-Evaluation  03/14/18    Authorization Type  [employer] Medicare- *Requires 10th visit progress note.  Medicaid secondary    PT Start Time  1315    PT Stop Time  1400    PT Time Calculation (min)  45 min    Equipment Utilized During Treatment  Gait belt    Activity Tolerance  Patient tolerated treatment well    Behavior During Therapy  WFL for tasks assessed/performed       Past Medical History:  Diagnosis Date  . AMAUROSIS FUGAX 12/24/2007  . ANEMIA OF CHRONIC DISEASE 05/08/2006  . ANEMIA, VITAMIN B12 DEFICIENCY NEC 01/12/2007  . GLAUCOMA 05/08/2006  . HYPERTENSION 05/08/2006  . MENTAL RETARDATION 05/08/2006  . 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 PULMONARY NODULE 10/15/2007  . RENAL INSUFFICIENCY 05/08/2006    Past Surgical History:  Procedure Laterality Date  . TONSILLECTOMY  1956    Vitals:   02/23/18 1548  Pulse: 62  SpO2: 94%    Subjective Assessment - 02/23/18 1319    Subjective  Pt reports he is doing well. Cousin reports his doctors have taken him off Lipitor with cousin reporting he is stumbling less. Reports he does not like doing his balancing exercises at home. Reports he will try to work on his exercises more.     Patient is accompained by:  Family member   Derrill Memo   Pertinent History  HTN, PVD, essential tremor, Amaurosis Fugax, Anemia of chronic disease, Paraproteinemia, Glaucoma, Mental Retardation, Pulmonary Nodule, Renal Insufficiency     Limitations  Standing;Walking    Patient Stated Goals  Pt reports goal of decreasing  falls and increasing LE strength     Currently in Pain?  No/denies         [facility] PT Assessment - 02/23/18 1324      Berg Balance Test   Sit to Stand  Able to stand without using hands and stabilize independently    Standing Unsupported  Able to stand safely 2 minutes    Sitting with Back Unsupported but Feet Supported on Floor or Stool  Able to sit safely and securely 2 minutes    Stand to Sit  Sits safely with minimal use of hands    Transfers  Able to transfer safely, minor use of hands    Standing Unsupported with Eyes Closed  Able to stand 10 seconds safely    Standing Ubsupported with Feet Together  Able to place feet together independently and stand 1 minute safely    From Standing, Reach Forward with Outstretched Arm  Can reach confidently >25 cm (10")    From Standing Position, Pick up Object from Floor  Able to pick up shoe safely and easily    From Standing Position, Turn to Look Behind Over each Shoulder  Looks behind one side only/other side shows less weight shift    Turn 360 Degrees  Able to turn 360 degrees safely in 4 seconds or less    Standing Unsupported, Alternately Place Feet on Step/Stool  Able to complete >2  steps/needs minimal assist    Standing Unsupported, One Foot in Front  Able to plae foot ahead of the other independently and hold 30 seconds    Standing on One Leg  Tries to lift leg/unable to hold 3 seconds but remains standing independently    Total Score  48        [facility] Adult PT Treatment/Exercise - 02/23/18 1549      Ambulation/Gait   Ambulation/Gait  Yes    Ambulation/Gait Assistance  5: Supervision    Ambulation/Gait Assistance Details  Pt performs ambulation trial outdoors navigating uneven terrain, curbs, ramps, and demonstrates ability to vary gait speed when cued. Therapist cues patient to maintain forward gaze and perform reciprocal arm swing to improve gait pattern. Patient verbalizes understanding; however, demonstrates little carry over. Pt  continues to ambulate with excessive weight shifted anteriorly with inability to self correct. Pt performs ambulation trial while holding resistance band performing scapular retraction with therapist providing resistance anteriorly during gait. Pt demonstrates improvement in upright posture with dec anterior weight shift. Pt may benefit from continued practice.      Ambulation Distance (Feet)  500 Feet    Assistive device  None    Gait Pattern  Step-through pattern;Decreased dorsiflexion - right;Decreased dorsiflexion - left;Decreased trunk rotation;Poor foot clearance - left;Poor foot clearance - right    Ambulation Surface  Level;Unlevel;Indoor;Outdoor;Paved    Stairs  Yes    Stairs Assistance  5: Supervision    Stairs Assistance Details (indicate cue type and reason)  Pt performs 4 steps x2 trials with single HR and reciprocal stepping pattern    Stair Management Technique  One rail Right;Alternating pattern    Number of Stairs  4   x2 sets   Height of Stairs  6    Curb  5: Supervision    Curb Details (indicate cue type and reason)  Therapist demonstrated and verbalized proper technique for curb negotiation for improvement in safety. Pt verbalizes understanding with good carry over. Pt will benefit from further practice to assess carry over next visit.        Balance   Balance Assessed  Yes      Dynamic Standing Balance   Compliant surfaces comments:  Pt performs standing balance on rocker board with therapist shifting weight anteriorly for multiple trials and patient having to activate posteriorly musculature to return COM over BOS. Pt the progresses to multiple trials of shifting weight anteriorly while reaching and retrieving weighted ball and return to center. Pt demonstrates inc SOB s/p activity requiring prolonged rest break.             PT Education - 02/23/18 1548    Education Details  Therapist continued to provide education regarding the importance of patient performing  balance HEP at home to reduce fall risk. Patient and cousin verbalize understanding indicating Pt will be more compliant.     Person(s) Educated  Patient;Other (comment)   cousin: [name]   Methods  Explanation    Comprehension  Verbalized understanding       PT Short Term Goals - 02/23/18 1557      PT SHORT TERM GOAL #1   Title  Pt will participate in establishment of initial HEP to further improve balance and strength to improve safety with functional mobility     Baseline  10/29: Patient reports non compliancy with HEP program     Time  3    Period  Weeks    Status  Not Met        PT SHORT TERM GOAL #2   Title  Pt will participate in DGI assessment to establish baseline fall risk potential during dynamic gait with no AD     Baseline  10/22: 16/24 indicative of high fall risk potential     Time  3    Period  Weeks    Status  Achieved      PT SHORT TERM GOAL #3   Title  Pt will ambulate 500 feet with no AD over uneven surfaces, curbs, inclines, 4 steps with single HR, and change gait speed when cued with CGA using no AD to improve safety with community ambulation.     Time  3    Period  Weeks    Status  Achieved      PT SHORT TERM GOAL #4   Title  Pt will participate in 6 minute walk test to determine baseline endurance during timed gait assessment with no AD    Baseline  10/22: 1227 feet with no AD    Time  3    Period  Weeks    Status  Achieved      PT SHORT TERM GOAL #5   Title  Pt will improve Berg score from 44/56 to >/= 48/56 to reduce fall risk potential     Baseline  10/29: 48/56     Time  3    Period  Weeks    Status  Achieved        PT Long Term Goals - 01/28/18 1310      PT LONG TERM GOAL #1   Title  Pt will report compliancy with HEP performance under direct supervision from family members for safety consideration to further progress towards improvement with functional mobility.     Time  6    Period  Weeks    Status  New    Target Date  03/14/18       PT LONG TERM GOAL #2   Title  Pt will improve Berg Score to >/=52/56 to reduce fall risk potential and increase safety during balance tasks     Time  6    Period  Weeks    Status  New    Target Date  03/14/18      PT LONG TERM GOAL #3   Title  Pt will improve DGI score to >/=19/24 to reduce fall risk potential during dynamic gait     Time  6    Period  Weeks    Status  New    Target Date  03/14/18      PT LONG TERM GOAL #4   Title  Pt will ambulate 1000 feet navigating uneven terrain, curbs, inclines, 12 steps using single HR, and changing gait speed when cued with supervision and no AD to increase safety with community ambulation.     Time  6    Period  Weeks    Status  New    Target Date  03/14/18      PT LONG TERM GOAL #5   Title  Pt will improve 6 min walk test by 100 feet indicating improvement in endurance during functional mobility with no AD    Time  6    Period  Weeks    Status  New    Target Date  03/14/18            Plan - 02/23/18 1559    Clinical Impression Statement  Today's skilled session focused on reassessment of patient STG's.   Overall, patient has met 4/5 STG's demonstrating improvement in safety with community ambulation and dec fall risk potential indicated by improvement in Berg score. Patient continues to report that he does not perform his HEP at home with therapist educating the importance of incorporating his HEP into daily routine to further progress towards LTG's. Patient will continue to benefit from skilled PT to address high level balance, strength, and functional mobility deficits.      Rehab Potential  Good    Clinical Impairments Affecting Rehab Potential  New diagnosis of PVD & past medical history of mental retardation which may affect HEP compliance    PT Frequency  2x / week    PT Duration  6 weeks    PT Treatment/Interventions  ADLs/Self Care Home Management;Stair training;Patient/family education;Functional mobility training;Therapeutic  activities;Therapeutic exercise;Balance training;Neuromuscular re-education;Electrical Stimulation;DME Instruction;Gait training    PT Next Visit Plan  check compliancy with HEP? POC moving forward? strengthening to HEP, zoom ball on compliant surface, STS with band around pelvis to promote hip extension and dec reliance on toes during stand, and band resistance in front with patient pulling back during stands, gait with walking poles for recirpocal arm swing  high level balance with dual tasks, rocker board for falls forwards (stepping, anterior pertubations, balance strategies), hurdles/ cone weaving/ compliant surface. Balance on compliant surfaces with eyes closed, fall recovery, step ups.     PT Home Exercise Plan  [id number]     Consulted and Agree with Plan of Care  Patient;Family member/caregiver    Family Member Consulted  Cousin- [name]        Patient will benefit from skilled therapeutic intervention in order to improve the following deficits and impairments:  Abnormal gait, Difficulty walking, Decreased balance, Decreased strength, Decreased knowledge of use of DME  Visit Diagnosis: Unsteadiness on feet  Other abnormalities of gait and mobility     Problem List Patient Active Problem List   Diagnosis Date Noted  . Fall 01/06/2018  . Tenderness over spine 01/06/2018  . HLD (hyperlipidemia) 08/17/2017  . Asthma 04/14/2016  . Nocturnal leg cramps 10/20/2015  . Allergic rhinitis 10/20/2015  . Healthcare maintenance 01/22/2015  . Osteoarthritis of both knees 09/17/2013  . Vitamin D deficiency 09/27/2012  . Benign essential tremor 08/31/2012  . Carpal tunnel syndrome 08/31/2012  . AMAUROSIS FUGAX 12/24/2007  . 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 Pulmonary nodule 10/15/2007  . Anemia of chronic disease 05/08/2006  . MENTAL RETARDATION 05/08/2006  . GLAUCOMA 05/08/2006  . Essential hypertension, benign 05/08/2006  . Chronic kidney disease (CKD), stage IV (severe) (HCC)  05/08/2006     , SPT 02/23/2018, 4:15 PM  [facility] [address] [place], , [address] Phone: [phone]   Fax:  [phone]  Name: Jerrold J Ng MRN: [id number] Date of Birth: 12/03/1940   

## 2018-02-24 DIAGNOSIS — E538 Deficiency of other specified B group vitamins: Secondary | ICD-10-CM | POA: Insufficient documentation

## 2018-02-24 MED ORDER — PROPRANOLOL HCL 40 MG PO TABS: 40.0000 mg | ORAL_TABLET | Freq: Two times a day (BID) | ORAL | 5 refills | Status: DC

## 2018-02-24 NOTE — Addendum Note (Signed)
Addended by: Jodean Lima on: 02/24/2018 03:56 PM   Modules accepted: Orders

## 2018-02-24 NOTE — Assessment & Plan Note (Addendum)
Patient has a history of Vitamin B12 deficiency. Repeat levels checked during visit are borderline, 272. Will need additional testing with MMA and homocysteine. Patient has a pharmacy appointment tomorrow in our clinic. Will add on a lab visit.  -- Check MMA and homocysteine   ADDENDUM: Vit B12 borderline, but MMA and homocysteine are elevated consistent with deficiency. Sent prescription for Vit B12 supplements to [facility] on [address]. He has a lab appointment scheduled tomorrow for duplicate MMA and homocysteine orders (lab was able to add on to prior collection). Will cancel duplicate orders but add folate level for tomorrows lab visit.

## 2018-02-24 NOTE — Telephone Encounter (Signed)
Resent propanolol prescription with correct instructions.

## 2018-02-25 ENCOUNTER — Ambulatory Visit: Payer: Medicare Other | Admitting: Physical Therapy

## 2018-02-25 ENCOUNTER — Encounter: Payer: Self-pay | Admitting: Physical Therapy

## 2018-02-25 ENCOUNTER — Other Ambulatory Visit: Payer: Medicare Other

## 2018-02-25 DIAGNOSIS — R2689 Other abnormalities of gait and mobility: Secondary | ICD-10-CM | POA: Diagnosis not present

## 2018-02-25 DIAGNOSIS — M6281 Muscle weakness (generalized): Secondary | ICD-10-CM | POA: Diagnosis not present

## 2018-02-25 DIAGNOSIS — R2681 Unsteadiness on feet: Secondary | ICD-10-CM | POA: Diagnosis not present

## 2018-02-25 DIAGNOSIS — R296 Repeated falls: Secondary | ICD-10-CM | POA: Diagnosis not present

## 2018-02-25 NOTE — Therapy (Signed)
[facility] [address] [place], [place], [address] Phone: [phone]   Fax:  [phone]  Physical Therapy Treatment  Patient Details  Name: Jared Woodward MRN: [id number] Date of Birth: 03/07/1941 Referring Provider (PT): Gorica Svalina, MD   Encounter Date: 02/25/2018  PT End of Session - 02/25/18 1436    Visit Number  5    Number of Visits  13    Date for PT Re-Evaluation  03/14/18    Authorization Type  [employer] Medicare- *Requires 10th visit progress note.  Medicaid secondary    PT Start Time  1405    PT Stop Time  1425    PT Time Calculation (min)  20 min    Activity Tolerance  Patient tolerated treatment well    Behavior During Therapy  WFL for tasks assessed/performed       Past Medical History:  Diagnosis Date  . AMAUROSIS FUGAX 12/24/2007  . ANEMIA OF CHRONIC DISEASE 05/08/2006  . ANEMIA, VITAMIN B12 DEFICIENCY NEC 01/12/2007  . GLAUCOMA 05/08/2006  . HYPERTENSION 05/08/2006  . MENTAL RETARDATION 05/08/2006  . 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 PULMONARY NODULE 10/15/2007  . RENAL INSUFFICIENCY 05/08/2006    Past Surgical History:  Procedure Laterality Date  . TONSILLECTOMY  1956    There were no vitals filed for this visit.  Subjective Assessment - 02/25/18 1405    Subjective  Pt reports his balance has improved since coming off the Lipitor medication. Reports he has not been performing HEP at home. Feels his balance has returned to baseline with no recent experiences with stumbles or falls.     Patient is accompained by:  Family member   Cousin [name] waited in waiting room   Pertinent History  HTN, PVD, essential tremor, Amaurosis Fugax, Anemia of chronic disease, Paraproteinemia, Glaucoma, Mental Retardation, Pulmonary Nodule, Renal Insufficiency     Limitations  Standing;Walking    Patient Stated Goals  Pt reports goal of decreasing falls and increasing LE strength     Currently in Pain?   No/denies         [facility] Adult PT Treatment/Exercise - 02/25/18 1432      Self-Care   Self-Care  --      Therapeutic Activites    Therapeutic Activities  Other Therapeutic Activities    Other Therapeutic Activities  Therapist provided education regarding POC moving forward with patient agreeing to being placed on hold. Pt reports his balance has significantly improved since no longer being prescribed Lipitor. Therapist educates patient regarding continuing to perform HEP at home and to follow up if he experiences any issues with balance or falls.              PT Education - 02/25/18 1435    Education Details  Therapist provided education regarding POC moving forward with both patient and cousin [name] in agreement to place patient on hold. Therapist will follow up with patient prior to his 11/21 therapy appointment to decide between discharge or continuing with therapy.     Person(s) Educated  Patient;Other (comment)   cousin [name]   Methods  Explanation    Comprehension  Verbalized understanding       PT Short Term Goals - 02/23/18 1557      PT SHORT TERM GOAL #1   Title  Pt will participate in establishment of initial HEP to further improve balance and strength to improve safety with functional mobility     Baseline  10/29: Patient reports non   compliancy with HEP program     Time  3    Period  Weeks    Status  Not Met      PT SHORT TERM GOAL #2   Title  Pt will participate in DGI assessment to establish baseline fall risk potential during dynamic gait with no AD     Baseline  10/22: 16/24 indicative of high fall risk potential     Time  3    Period  Weeks    Status  Achieved      PT SHORT TERM GOAL #3   Title  Pt will ambulate 500 feet with no AD over uneven surfaces, curbs, inclines, 4 steps with single HR, and change gait speed when cued with CGA using no AD to improve safety with community ambulation.     Time  3    Period  Weeks    Status  Achieved      PT  SHORT TERM GOAL #4   Title  Pt will participate in 6 minute walk test to determine baseline endurance during timed gait assessment with no AD    Baseline  10/22: 1227 feet with no AD    Time  3    Period  Weeks    Status  Achieved      PT SHORT TERM GOAL #5   Title  Pt will improve Berg score from 44/56 to >/= 48/56 to reduce fall risk potential     Baseline  10/29: 48/56     Time  3    Period  Weeks    Status  Achieved        PT Long Term Goals - 01/28/18 1310      PT LONG TERM GOAL #1   Title  Pt will report compliancy with HEP performance under direct supervision from family members for safety consideration to further progress towards improvement with functional mobility.     Time  6    Period  Weeks    Status  New    Target Date  03/14/18      PT LONG TERM GOAL #2   Title  Pt will improve Berg Score to >/=52/56 to reduce fall risk potential and increase safety during balance tasks     Time  6    Period  Weeks    Status  New    Target Date  03/14/18      PT LONG TERM GOAL #3   Title  Pt will improve DGI score to >/=19/24 to reduce fall risk potential during dynamic gait     Time  6    Period  Weeks    Status  New    Target Date  03/14/18      PT LONG TERM GOAL #4   Title  Pt will ambulate 1000 feet navigating uneven terrain, curbs, inclines, 12 steps using single HR, and changing gait speed when cued with supervision and no AD to increase safety with community ambulation.     Time  6    Period  Weeks    Status  New    Target Date  03/14/18      PT LONG TERM GOAL #5   Title  Pt will improve 6 min walk test by 100 feet indicating improvement in endurance during functional mobility with no AD    Time  6    Period  Weeks    Status  New    Target Date  03/14/18  Plan - 02/25/18 1437    Clinical Impression Statement  Today's session focused on patient education regarding POC moving forward with therapist recommending to place patient on hold.  Patient and cousin are both in agreement with plan and will follow up with therapist regarding finalizing discharge or continuing with therapy. Pt believes his balance has improved since no longer being prescribed Lipitor and would like to be placed on hold to see if his improvement is truly attributed to being off this medication. At this time, therapist recommends placing patient on hold and will follow up with patient prior to his 11/21 session.     Rehab Potential  Good    Clinical Impairments Affecting Rehab Potential  New diagnosis of PVD & past medical history of mental retardation which may affect HEP compliance    PT Frequency  2x / week    PT Duration  6 weeks    PT Treatment/Interventions  ADLs/Self Care Home Management;Stair training;Patient/family education;Functional mobility training;Therapeutic activities;Therapeutic exercise;Balance training;Neuromuscular re-education;Electrical Stimulation;DME Instruction;Gait training    PT Next Visit Plan  contact patient prior to 11/21 appt. to decide between D/C or continue with therapy     PT Home Exercise Plan  [id number]     Consulted and Agree with Plan of Care  Patient;Family member/caregiver    Family Member Consulted  Cousin- [name]        Patient will benefit from skilled therapeutic intervention in order to improve the following deficits and impairments:  Abnormal gait, Difficulty walking, Decreased balance, Decreased strength, Decreased knowledge of use of DME  Visit Diagnosis: Other abnormalities of gait and mobility     Problem List Patient Active Problem List   Diagnosis Date Noted  . Vitamin B12 deficiency 02/24/2018  . Fall 01/06/2018  . Tenderness over spine 01/06/2018  . HLD (hyperlipidemia) 08/17/2017  . Asthma 04/14/2016  . Nocturnal leg cramps 10/20/2015  . Allergic rhinitis 10/20/2015  . Healthcare maintenance 01/22/2015  . Osteoarthritis of both knees 09/17/2013  . Vitamin D deficiency 09/27/2012  . Benign  essential tremor 08/31/2012  . Carpal tunnel syndrome 08/31/2012  . AMAUROSIS FUGAX 12/24/2007  . 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 Pulmonary nodule 10/15/2007  . Anemia of chronic disease 05/08/2006  . MENTAL RETARDATION 05/08/2006  . GLAUCOMA 05/08/2006  . Essential hypertension, benign 05/08/2006  . Chronic kidney disease (CKD), stage IV (severe) (HCC) 05/08/2006     , SPT 02/25/2018, 2:41 PM  [facility] [address] [place], [place], [address] Phone: [phone]   Fax:  [phone]  Name: Yuji J Wingert MRN: [id number] Date of Birth: 08/02/1940   

## 2018-02-25 NOTE — Addendum Note (Signed)
Addended by: Truddie Crumble on: 02/25/2018 02:17 PM   Modules accepted: Orders

## 2018-03-01 LAB — METHYLMALONIC ACID, SERUM: METHYLMALONIC ACID: 524 nmol/L — AB (ref 0–378)

## 2018-03-01 LAB — HOMOCYSTEINE: Homocysteine: 20.9 umol/L — ABNORMAL HIGH (ref 0.0–15.0)

## 2018-03-04 ENCOUNTER — Ambulatory Visit: Payer: Medicare Other | Admitting: Physical Therapy

## 2018-03-04 MED ORDER — VITAMIN B-12 100 MCG PO TABS: 100.0000 ug | ORAL_TABLET | Freq: Every day | ORAL | 1 refills | Status: DC

## 2018-03-04 NOTE — Addendum Note (Signed)
Addended by: Jodean Lima on: 03/04/2018 09:31 AM   Modules accepted: Orders

## 2018-03-05 ENCOUNTER — Other Ambulatory Visit (INDEPENDENT_AMBULATORY_CARE_PROVIDER_SITE_OTHER): Payer: Medicare Other

## 2018-03-05 ENCOUNTER — Ambulatory Visit: Payer: Medicare Other | Admitting: Physical Therapy

## 2018-03-05 DIAGNOSIS — D649 Anemia, unspecified: Secondary | ICD-10-CM

## 2018-03-07 LAB — FOLATE RBC
FOLATE, RBC: 1017 ng/mL (ref >498)
Folate, Hemolysate: 295 ng/mL
Hematocrit: 29 % — ABNORMAL LOW (ref 37.5–51.0)

## 2018-03-08 ENCOUNTER — Ambulatory Visit: Payer: Medicare Other | Admitting: Physical Therapy

## 2018-03-11 ENCOUNTER — Ambulatory Visit: Payer: Medicare Other | Admitting: Physical Therapy

## 2018-03-16 ENCOUNTER — Ambulatory Visit: Payer: Medicare Other | Admitting: Rehabilitation

## 2018-03-17 ENCOUNTER — Inpatient Hospital Stay: Payer: Medicare Other | Attending: Oncology | Admitting: Oncology

## 2018-03-17 VITALS — BP 171/80 | HR 56 | Temp 97.7°F | Resp 17 | Ht 68.0 in | Wt 127.5 lb

## 2018-03-17 DIAGNOSIS — Z7982 Long term (current) use of aspirin: Secondary | ICD-10-CM

## 2018-03-17 DIAGNOSIS — Z79899 Other long term (current) drug therapy: Secondary | ICD-10-CM

## 2018-03-17 DIAGNOSIS — D631 Anemia in chronic kidney disease: Secondary | ICD-10-CM | POA: Diagnosis not present

## 2018-03-17 DIAGNOSIS — N189 Chronic kidney disease, unspecified: Secondary | ICD-10-CM | POA: Diagnosis not present

## 2018-03-17 DIAGNOSIS — D472 Monoclonal gammopathy: Secondary | ICD-10-CM | POA: Diagnosis not present

## 2018-03-17 NOTE — Progress Notes (Signed)
Hematology and Oncology Follow Up Visit  Jared Woodward [id number] 07/30/1940 77 y.o. 03/17/2018 8:00 AM [name], MDGuilloud, Hoyle Sauer, MD   Principle Diagnosis: 77 year-old man with IgA kappa MGUS diagnosed in 2009.  He presented with an M spike of 0.4 g/dL, and elevated IgA level without any endorgan damage.  Current therapy: Active surveillance.  Interim History:  [name] returns today for a repeat evaluation.  Since last visit, he reports no major changes in his health.  He denies any decline in his appetite her performance status.  He denies any bone pain or pathological fractures.  He denies excessive fatigue, tiredness or dyspnea on exertion.  He denies any recurrent infection or worsening neuropathy.  His quality of life and performance status remains unchanged.  He does not report any headaches, syncope or blurred vision.  He denies any alteration mental status or confusion.  He denies any fevers, chills or sweats.  He does not report any cough wheezing or hemoptysis.  He does not report any chest pain or difficulty breathing. Does not report any nausea or vomiting or abdominal pain.  Denies any changes in bowel habits.  Does not report any frequency urgency or hesitancy.  Denies any bleeding or clotting tendency.  He denies skin rashes or lesions.  Rest of his review of systems is negative.  Medications: I have reviewed the patient's current medications.  Current Outpatient Medications  Medication Sig Dispense Refill  . amLODipine (NORVASC) 5 MG tablet Take 1 tablet (5 mg total) by mouth daily. 90 tablet 3  . aspirin 81 MG tablet Take 81 mg by mouth daily.    . brimonidine (ALPHAGAN) 0.2 % ophthalmic solution INSTILL 1 DROP INTO EACH EYE TWICE DAILY  3  . cetirizine (ZYRTEC) 10 MG tablet Take 1/2 tablet daily 15 tablet 5  . Cholecalciferol (VITAMIN D3) 1000 units CAPS Take 2,000 tablets by mouth daily.    . dorzolamide (TRUSOPT) 2 % ophthalmic solution Place 1 drop into  both eyes 2 (two) times daily. 10 mL 4  . fluticasone (FLONASE) 50 MCG/ACT nasal spray Place 2 sprays into both nostrils daily. 16 g 2  . propranolol (INDERAL) 40 MG tablet Take 1 tablet (40 mg total) by mouth 2 (two) times daily. 60 tablet 5  . rosuvastatin (CRESTOR) 10 MG tablet Take 1 tablet (10 mg total) by mouth at bedtime. 30 tablet 11  . thiamine 250 MG tablet Take 1 tablet (250 mg total) by mouth daily. Reported on 10/18/2015 (Patient not taking: Reported on 01/28/2018) 30 tablet 5  . Travoprost, BAK Free, (TRAVATAN Z) 0.004 % SOLN ophthalmic solution Place 1 drop into both eyes at bedtime. 1 Bottle 2  . vitamin B-12 (CYANOCOBALAMIN) 100 MCG tablet Take 1 tablet (100 mcg total) by mouth daily. 90 tablet 1   No current facility-administered medications for this visit.      Allergies:  Allergies  Allergen Reactions  . Lisinopril Cough    Past Medical History, Surgical history, Social history, and Family History were reviewed and updated.   Physical Exam: Blood pressure (!) 171/80, pulse (!) 56, temperature 97.7 F (36.5 C), temperature source Oral, resp. rate 17, height 5' 8"  (1.727 m), weight 127 lb 8 oz (57.8 kg), SpO2 100 %.   ECOG: 1   General appearance: Comfortable appearing without any discomfort Head: Normocephalic without any trauma Oropharynx: Mucous membranes are moist and pink without any thrush or ulcers. Eyes: Pupils are equal and round reactive to light. Lymph nodes:  No cervical, supraclavicular, inguinal or axillary lymphadenopathy.   Heart:regular rate and rhythm.  S1 and S2 without leg edema. Lung: Clear without any rhonchi or wheezes.  No dullness to percussion. Abdomin: Soft, nontender, nondistended with good bowel sounds.  No hepatosplenomegaly. Musculoskeletal: No joint deformity or effusion.  Full range of motion noted. Neurological: No deficits noted on motor, sensory and deep tendon reflex exam. Skin: No petechial rash or dryness.  Appeared moist.     Lab Results: Lab Results  Component Value Date   WBC 3.7 01/06/2018   HGB 10.7 (L) 01/06/2018   HCT 29.0 (L) 03/05/2018   MCV 96 01/06/2018   PLT 201 01/06/2018     Chemistry      Component Value Date/Time   NA 143 10/18/2015 0859   NA 142 01/09/2015 0934   K 4.4 10/18/2015 0859   K 4.6 01/09/2015 0934   CL 102 10/18/2015 0859   CO2 25 10/18/2015 0859   CO2 31 (H) 01/09/2015 0934   BUN 23 10/18/2015 0859   BUN 30.7 (H) 01/09/2015 0934   CREATININE 2.02 (H) 10/18/2015 0859   CREATININE 2.2 (H) 01/09/2015 0934      Component Value Date/Time   CALCIUM 9.2 10/18/2015 0859   CALCIUM 9.1 01/09/2015 0934   ALKPHOS 42 01/09/2015 0934   AST 12 01/09/2015 0934   ALT 7 01/09/2015 0934   BILITOT 0.27 01/09/2015 0934     Results for Jared Woodward (MRN [id number]) as of 03/17/2018 08:00  Ref. Range 01/05/2014 14:42 01/09/2015 09:34  IgG (Immunoglobin G), Serum Latest Ref Range: 650 - 1,600 mg/dL 710 839  IgM, Serum Latest Ref Range: 41 - 251 mg/dL 17 (L) 27 (L)  IgA Latest Ref Range: 68 - 379 mg/dL 395 (H) 383 (H)  Total Protein, Serum Electrophoresis Latest Ref Range: 6.1 - 8.1 g/dL 5.5 (L) 6.0 (L)       Impression and Plan:  77 year old man with the following:  1.  IgA kappa MGUS diagnosed in 2009.  He has been on active surveillance without any indication for treatment and no and organ damage.  His M spike continues to be around 0.2 g/dL with mildly elevated IgA level.  The natural course of this disease was discussed today and indication for treatment were reviewed.  These would include symptomatic myeloma, bone involvement, renal failure related to plasma cell involvement or hypercalcemia.  At this time he continues to be asymptomatic I have recommended active surveillance.  2.  Chronic renal insufficiency: Unrelated to a plasma cell disorder and preceded these findings.  His kidney function remains stable without any changes.  3.  Anemia: Related to chronic renal  insufficiency and not multiple myeloma.  His hemoglobin ranged between 10 and 12 last 10 years without really any symptoms.  Growth factor support in the form of Procrit or Aranesp may be needed in the future if he develops worsening symptoms.  4.  Follow-up: We will be in 12 months to repeat laboratory testing including repeat protein studies.  15  minutes was spent with the patient face-to-face today.  More than 50% of time was dedicated to reviewing laboratory data, and the natural course of his disease, indication for treatment and future plan of care.       Zola Button, MD 11/20/20198:00 AM

## 2018-03-18 ENCOUNTER — Ambulatory Visit: Payer: Medicare Other | Attending: Internal Medicine | Admitting: Physical Therapy

## 2018-03-18 ENCOUNTER — Telehealth: Payer: Self-pay | Admitting: Internal Medicine

## 2018-03-18 ENCOUNTER — Encounter: Payer: Self-pay | Admitting: Physical Therapy

## 2018-03-18 DIAGNOSIS — M6281 Muscle weakness (generalized): Secondary | ICD-10-CM | POA: Diagnosis not present

## 2018-03-18 DIAGNOSIS — R2689 Other abnormalities of gait and mobility: Secondary | ICD-10-CM | POA: Diagnosis not present

## 2018-03-18 DIAGNOSIS — R296 Repeated falls: Secondary | ICD-10-CM | POA: Insufficient documentation

## 2018-03-18 DIAGNOSIS — R2681 Unsteadiness on feet: Secondary | ICD-10-CM | POA: Diagnosis not present

## 2018-03-18 NOTE — Telephone Encounter (Signed)
Called the home #, a gentleman answered stated he is the brother and pt is out shopping with their mother. Lm for rtc

## 2018-03-18 NOTE — Therapy (Signed)
[facility] [address] [place], [place], [address] Phone: [phone]   Fax:  [phone]  Physical Therapy Treatment and D/C Summary  Patient Details  Name: Jared Woodward MRN: [id number] Date of Birth: 05-13-1940 Referring Provider (PT): Alphonzo Grieve, MD   Encounter Date: 03/18/2018  PT End of Session - 03/18/18 1347    Visit Number  6    Number of Visits  13    Date for PT Re-Evaluation  03/14/18    Authorization Type  [employer] Medicare- *Requires 10th visit progress note.  Medicaid secondary    PT Start Time  3491    PT Stop Time  1344   did not finish due to elevated BP   PT Time Calculation (min)  27 min    Activity Tolerance  Treatment limited secondary to medical complications (Comment)    Behavior During Therapy  [facility] for tasks assessed/performed       Past Medical History:  Diagnosis Date  . AMAUROSIS FUGAX 12/24/2007  . ANEMIA OF CHRONIC DISEASE 05/08/2006  . ANEMIA, VITAMIN B12 DEFICIENCY NEC 01/12/2007  . GLAUCOMA 05/08/2006  . HYPERTENSION 05/08/2006  . MENTAL RETARDATION 05/08/2006  . 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 PULMONARY NODULE 10/15/2007  . RENAL INSUFFICIENCY 05/08/2006    Past Surgical History:  Procedure Laterality Date  . TONSILLECTOMY  1956    There were no vitals filed for this visit.  Subjective Assessment - 03/18/18 1323    Subjective  Pt returns after a few weeks of time off.  Continues to report that balance is improved off of Lipitor, no falls and no changes to any medications.    Patient is accompained by:  Family member   Derrill Memo waited in waiting room   Pertinent History  HTN, PVD, essential tremor, Amaurosis [place], Anemia of chronic disease, Paraproteinemia, Glaucoma, Mental Retardation, Pulmonary Nodule, Renal Insufficiency     Limitations  Standing;Walking    Patient Stated Goals  Pt reports goal of decreasing falls and increasing LE strength     Currently  in Pain?  No/denies         [facility] PT Assessment - 03/18/18 1326      6 Minute Walk- Baseline   6 Minute Walk- Baseline  yes    BP (mmHg)  (!) 183/96    HR (bpm)  (!) 48    02 Sat (%RA)  95 %    Modified Borg Scale for Dyspnea  0- Nothing at all    Perceived Rate of Exertion (Borg)  7- Very, very light      6 Minute walk- Post Test   BP (mmHg)  (!) 195/104    HR (bpm)  58      Standardized Balance Assessment   Standardized Balance Assessment  Dynamic Gait Index      Berg Balance Test   Sit to Stand  Able to stand without using hands and stabilize independently    Standing Unsupported  Able to stand safely 2 minutes    Sitting with Back Unsupported but Feet Supported on Floor or Stool  Able to sit safely and securely 2 minutes    Stand to Sit  Sits safely with minimal use of hands    Transfers  Able to transfer safely, minor use of hands    Standing Unsupported with Eyes Closed  Able to stand 10 seconds safely    Standing Ubsupported with Feet Together  Able to place feet together independently and stand 1  minute safely    From Standing, Reach Forward with Outstretched Arm  Can reach confidently >25 cm (10")    From Standing Position, Pick up Object from [place] to pick up shoe safely and easily    From Standing Position, Turn to Look Behind Over each Shoulder  Looks behind one side only/other side shows less weight shift    Turn 360 Degrees  Able to turn 360 degrees safely in 4 seconds or less    Standing Unsupported, Alternately Place Feet on Step/Stool  Able to stand independently and safely and complete 8 steps in 20 seconds    Standing Unsupported, One Foot in Front  Able to plae foot ahead of the other independently and hold 30 seconds    Standing on One Leg  Able to lift leg independently and hold equal to or more than 3 seconds    Total Score  52    Berg comment:  52/56      Dynamic Gait Index   Level Surface  Normal    Change in Gait Speed  Normal    Gait with  Horizontal Head Turns  Mild Impairment    Gait with Vertical Head Turns  Mild Impairment    Gait and Pivot Turn  Normal    Step Over Obstacle  Normal    Step Around Obstacles  Normal    Steps  Normal    Total Score  22    DGI comment:  22/24                           PT Education - 03/18/18 1345    Education Details  pt's progress towards goals; contact MD regarding hypertension, D/C today - continue HEP    Person(s) Educated  Patient;Caregiver(s)    Methods  Explanation    Comprehension  Verbalized understanding       PT Short Term Goals - 02/23/18 1557      PT SHORT TERM GOAL #1   Title  Pt will participate in establishment of initial HEP to further improve balance and strength to improve safety with functional mobility     Baseline  10/29: Patient reports non compliancy with HEP program     Time  3    Period  Weeks    Status  Not Met      PT SHORT TERM GOAL #2   Title  Pt will participate in DGI assessment to establish baseline fall risk potential during dynamic gait with no AD     Baseline  10/22: 16/24 indicative of high fall risk potential     Time  3    Period  Weeks    Status  Achieved      PT SHORT TERM GOAL #3   Title  Pt will ambulate 500 feet with no AD over uneven surfaces, curbs, inclines, 4 steps with single HR, and change gait speed when cued with CGA using no AD to improve safety with community ambulation.     Time  3    Period  Weeks    Status  Achieved      PT SHORT TERM GOAL #4   Title  Pt will participate in 6 minute walk test to determine baseline endurance during timed gait assessment with no AD    Baseline  10/22: 1227 feet with no AD    Time  3    Period  Weeks    Status  Achieved      PT SHORT TERM GOAL #5   Title  Pt will improve Berg score from 44/56 to >/= 48/56 to reduce fall risk potential     Baseline  10/29: 48/56     Time  3    Period  Weeks    Status  Achieved        PT Long Term Goals - 03/18/18 1325       PT LONG TERM GOAL #1   Title  Pt will report compliancy with HEP performance under direct supervision from family members for safety consideration to further progress towards improvement with functional mobility.     Time  6    Period  Weeks    Status  Not Met      PT LONG TERM GOAL #2   Title  Pt will improve Berg Score to >/=52/56 to reduce fall risk potential and increase safety during balance tasks     Baseline  52/56    Time  6    Period  Weeks    Status  Achieved      PT LONG TERM GOAL #3   Title  Pt will improve DGI score to >/=19/24 to reduce fall risk potential during dynamic gait     Baseline  22/24    Time  6    Period  Weeks    Status  Achieved      PT LONG TERM GOAL #4   Title  Pt will ambulate 1000 feet navigating uneven terrain, curbs, inclines, 12 steps using single HR, and changing gait speed when cued with supervision and no AD to increase safety with community ambulation.     Time  6    Period  Weeks    Status  Unable to assess      PT LONG TERM GOAL #5   Title  Pt will improve 6 min walk test by 100 feet indicating improvement in endurance during functional mobility with no AD    Time  6    Period  Weeks    Status  Unable to assess            Plan - 03/18/18 1348    Clinical Impression Statement  Pt returns to assess progress towards goals.  Pt began session with elevated BP but not symptomatic or over allowable limit to treat.  Performed BERG and DGI with pt demonstrating improvement in static and dynamic balance and decreased falls risk.  Following assessments pt's BP elevated significantly.  Terminated session due to increase in BP and requested that pt and caregiver contact physician for further guidance.  Pt has met 2/5 LTG, did not meet HEP goal (pt admitted to not being compliant with HEP) and unable to assess final two goals due to elevated BP.  Pt and caregiver are pleased with pt's progress with balance and feel he is ready to D/C.    Rehab  Potential  Good    Clinical Impairments Affecting Rehab Potential  New diagnosis of PVD & past medical history of mental retardation which may affect HEP compliance    PT Frequency  2x / week    PT Duration  6 weeks    PT Treatment/Interventions  ADLs/Self Care Home Management;Stair training;Patient/family education;Functional mobility training;Therapeutic activities;Therapeutic exercise;Balance training;Neuromuscular re-education;Electrical Stimulation;DME Instruction;Gait training    PT Next Visit Plan  D/C    PT Home Exercise Plan  [id number]     Consulted and Agree with Plan of Care  Patient;Family member/caregiver    Family Member Consulted  Cousin- Deneise Lever        Patient will benefit from skilled therapeutic intervention in order to improve the following deficits and impairments:  Abnormal gait, Difficulty walking, Decreased balance, Decreased strength, Decreased knowledge of use of DME  Visit Diagnosis: Other abnormalities of gait and mobility  Unsteadiness on feet  Repeated falls  Muscle weakness (generalized)     Problem List Patient Active Problem List   Diagnosis Date Noted  . Vitamin B12 deficiency 02/24/2018  . Fall 01/06/2018  . Tenderness over spine 01/06/2018  . HLD (hyperlipidemia) 08/17/2017  . Asthma 04/14/2016  . Nocturnal leg cramps 10/20/2015  . Allergic rhinitis 10/20/2015  . Healthcare maintenance 01/22/2015  . Osteoarthritis of both knees 09/17/2013  . Vitamin D deficiency 09/27/2012  . Benign essential tremor 08/31/2012  . Carpal tunnel syndrome 08/31/2012  . AMAUROSIS FUGAX 12/24/2007  . 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 Pulmonary nodule 10/15/2007  . Anemia of chronic disease 05/08/2006  . MENTAL RETARDATION 05/08/2006  . GLAUCOMA 05/08/2006  . Essential hypertension, benign 05/08/2006  . Chronic kidney disease (CKD), stage IV (severe) ([place]) 05/08/2006    PHYSICAL THERAPY DISCHARGE SUMMARY  Visits from Start of Care: 6  Current  functional level related to goals / functional outcomes: See LTG and impression statement above   Remaining deficits: Impaired balance   Education / Equipment: HEP  Plan: Patient agrees to discharge.  Patient goals were partially met. Patient is being discharged due to being pleased with the current functional level.  ?????     Rico Junker, PT, DPT 03/18/18    1:53 PM    [facility] [address] [place], [place], [address] Phone: [phone]   Fax:  [phone]  Name: Jared Woodward MRN: [id number] Date of Birth: April 07, 1941

## 2018-03-18 NOTE — Telephone Encounter (Signed)
Received notification from patient's PT that BP was elevated. On chart review, BP has previously been well controlled, however over last 2 days (onc appt and PT) SBP has been 170-180s.   I attempted contacting patient and his sister (caretaker) to assess if patient is symptomatic, but was unable to reach at either number.   Will forward to triage to attempt contacting to assess if symptomatic with chest pain, shortness of breath, headaches, acute vision changes. If yes, then would recommend ED evaluation. If not, then would recommend clinic follow up in the next week or so.  Alphonzo Grieve, MD IMTS - PGY3

## 2018-03-19 ENCOUNTER — Ambulatory Visit (INDEPENDENT_AMBULATORY_CARE_PROVIDER_SITE_OTHER): Payer: Medicare Other | Admitting: Internal Medicine

## 2018-03-19 ENCOUNTER — Encounter: Payer: Self-pay | Admitting: Neurology

## 2018-03-19 ENCOUNTER — Other Ambulatory Visit: Payer: Self-pay

## 2018-03-19 VITALS — BP 147/73 | HR 58 | Temp 98.0°F | Ht 68.0 in | Wt 126.9 lb

## 2018-03-19 DIAGNOSIS — I10 Essential (primary) hypertension: Secondary | ICD-10-CM | POA: Diagnosis not present

## 2018-03-19 DIAGNOSIS — Z79899 Other long term (current) drug therapy: Secondary | ICD-10-CM | POA: Diagnosis not present

## 2018-03-19 DIAGNOSIS — D472 Monoclonal gammopathy: Secondary | ICD-10-CM | POA: Diagnosis not present

## 2018-03-19 DIAGNOSIS — G250 Essential tremor: Secondary | ICD-10-CM | POA: Diagnosis not present

## 2018-03-19 MED ORDER — AMLODIPINE BESYLATE 5 MG PO TABS: 5.0000 mg | ORAL_TABLET | Freq: Every day | ORAL | 3 refills | Status: DC

## 2018-03-19 NOTE — Assessment & Plan Note (Signed)
Mr. Cubero BP has been elevated recently at other healthcare appointments. On 11/19 at his oncologist's office his BP was 171/80. Yesterday at his PT session, his BP was 195/104. The patient's BP today is 147/73. He denies headaches, vision changes, chest pain, dyspnea, lower extremity swelling, or urinary changes. He has not been taking his amlodipine for an unknown amount of time because he ran out. The patient cannot read and his cousin helps him with his medications. He was well-controlled on the amlodipine, so we will resume that medication today. The patient was also counseled on avoiding salty foods.   Plan 1. Resume amlodipine 5mg  daily 2. Avoid salty foods 3. F/u with PCP in 3 months

## 2018-03-19 NOTE — Assessment & Plan Note (Signed)
HPI: The patient reports that his tremor is very bothersome. The tremor worsens with activity, but it can also be active when he is sitting still. He did not notice an improvement when his propranolol was increased to 40mg  BID seven months ago. He has never seen a neurologist for this problem.   Assessment: HR today is 58. Therefore, will not increase the dose of propranolol at this time. Benzodiazepines are second line for essential tremor. I am reluctant to prescribe a BZD as the patient has a history of falls for which he is currently working with PT. He may require primodine for better tremor control. Will refer to neurology for further evaluation.   Plan 1. Continue propranolol 40mg  BID 2. Referral to neurology placed

## 2018-03-19 NOTE — Telephone Encounter (Signed)
Pt seen today in [place]. No further action needed, phone call complete.Despina Hidden Cassady11/22/201912:12 PM

## 2018-03-19 NOTE — Progress Notes (Signed)
Internal Medicine Clinic Attending  I saw and evaluated the patient.  I personally confirmed the key portions of the history and exam documented by Dr. Dorrell and I reviewed pertinent patient test results.  The assessment, diagnosis, and plan were formulated together and I agree with the documentation in the resident's note. 

## 2018-03-19 NOTE — Progress Notes (Signed)
   CC: HTN f/u  HPI:   [name] is a 77 y.o. male with a medical history of HTN and MGUS as well as the other medical conditions listed below who presents to the internal medicine clinic for a BP check. His essential tremor was also discussed at this visit. Please see problem based charting for the history and status of the patient's current and chronic medical conditions.   Past Medical History:  Diagnosis Date  . AMAUROSIS FUGAX 12/24/2007  . ANEMIA OF CHRONIC DISEASE 05/08/2006  . ANEMIA, VITAMIN B12 DEFICIENCY NEC 01/12/2007  . GLAUCOMA 05/08/2006  . HYPERTENSION 05/08/2006  . MENTAL RETARDATION 05/08/2006  . 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 PULMONARY NODULE 10/15/2007  . RENAL INSUFFICIENCY 05/08/2006    Review of Systems:   Pertinent positives mentioned in HPI. Remainder of all ROS negative.  Physical Exam: Vitals:   03/19/18 1047  BP: (!) 147/73  Pulse: (!) 58  Temp: 98 F (36.7 C)  TempSrc: Oral  SpO2: 100%  Weight: 126 lb 14.4 oz (57.6 kg)  Height: 5\' 8"  (1.727 m)   Physical Exam  Constitutional: Well-developed, well-nourished, and in no distress.  Eyes: Pupils are equal, round, and reactive to light. EOM are normal.  Cardiovascular: Normal rate and regular rhythm. No murmurs, rubs, or gallops. Pulmonary/Chest: Effort normal. Clear to auscultation bilaterally. No wheezes, rales, or rhonchi. Abdominal: Bowel sounds present. Soft, non-distended, non-tender. Ext: No lower extremity edema. Neuro: Resting tremor R>L. Tremor worsens with hand movements. Skin: Warm and dry. No rashes or wounds.   Assessment & Plan:   See Encounters Tab for problem based charting.  Patient seen with Dr. Evette Doffing

## 2018-03-19 NOTE — Patient Instructions (Signed)
It was a pleasure meeting you today, [name]!  For your high blood pressure - Start taking amlodipine 5mg  every day. I sent this prescription into your pharmacy.  - Avoid salty foods. Look at the food labels and try to eat less than 1500 mg of salt each day.   For your tremor - I have made a referral to a neurologist. You will hear from their office about scheduling an appointment.   Please follow-up with your PCP Dr. Jari Favre in 3 months for a BP check  Feel free to call our clinic at [phone] if you have any questions.  Thanks, Dr. [name] Paras

## 2018-03-23 NOTE — Progress Notes (Signed)
Jared Woodward was seen today in the movement disorders clinic for neurologic consultation at the request of Dorrell, Andree Elk, MD.  The consultation is for the evaluation of essential tremor.  The records that were made available to me were reviewed.  Pt is accompanied by his cousin who supplements the history.  Pt lives with cousin.  Tremor: Yes.     How long has it been going on? 10+ years  At rest or with activation?  Both with use and at rest  Fam hx of tremor?  No.  Located where?  Bilateral UE, but independent of one another  Affected by caffeine:  Unknown how much (drinks mountain dew but cannot state how much)  Affected by alcohol:  Doesn't drink any EtOH any longer (never drank a lot)  Affected by stress:  unknown  Affected by fatigue:  No.  Spills soup if on spoon:  No.  Affects ADL's (tying shoes, brushing teeth, etc):  No.  Tremor inducing meds:  No.   On propranolol, 40 mg bid, which was increased about 7 months ago.  ? If taking but is for BP  Other Specific Symptoms:  Voice: no change Sleep: sleeping well  Vivid Dreams:  No.  Acting out dreams:  No. Wet Pillows: No. Postural symptoms:  No.  Falls?  No. Bradykinesia symptoms: shuffling gait - going to PT last week because of falls even though denied falls to me Loss of smell:  No. Loss of taste:  No. Urinary Incontinence:  No. Difficulty Swallowing:  No. Handwriting, micrographia: No. but is tremulous Depression:  No. Memory changes:  No. N/V:  No. Lightheaded:  No.  Syncope: No. Diplopia:  No. Dyskinesia:  No.  Neuroimaging of the brain has not previously been performed.    ALLERGIES:   Allergies  Allergen Reactions  . Lisinopril Cough    CURRENT MEDICATIONS:  Outpatient Encounter Medications as of 03/29/2018  Medication Sig  . amLODipine (NORVASC) 5 MG tablet Take 1 tablet (5 mg total) by mouth daily.  Marland Kitchen aspirin 81 MG tablet Take 81 mg by mouth daily.  . brimonidine (ALPHAGAN) 0.2 %  ophthalmic solution INSTILL 1 DROP INTO EACH EYE TWICE DAILY  . cetirizine (ZYRTEC) 10 MG tablet Take 1/2 tablet daily  . Cholecalciferol (VITAMIN D3) 1000 units CAPS Take 2,000 tablets by mouth daily.  . propranolol (INDERAL) 40 MG tablet Take 1 tablet (40 mg total) by mouth 2 (two) times daily.  . rosuvastatin (CRESTOR) 10 MG tablet Take 1 tablet (10 mg total) by mouth at bedtime.  . Travoprost, BAK Free, (TRAVATAN Z) 0.004 % SOLN ophthalmic solution Place 1 drop into both eyes at bedtime.  . vitamin B-12 (CYANOCOBALAMIN) 100 MCG tablet Take 1 tablet (100 mcg total) by mouth daily.  . [DISCONTINUED] dorzolamide (TRUSOPT) 2 % ophthalmic solution Place 1 drop into both eyes 2 (two) times daily.  . [DISCONTINUED] fluticasone (FLONASE) 50 MCG/ACT nasal spray Place 2 sprays into both nostrils daily.  . [DISCONTINUED] thiamine 250 MG tablet Take 1 tablet (250 mg total) by mouth daily. Reported on 10/18/2015   No facility-administered encounter medications on file as of 03/29/2018.     PAST MEDICAL HISTORY:   Past Medical History:  Diagnosis Date  . AMAUROSIS FUGAX 12/24/2007  . ANEMIA OF CHRONIC DISEASE 05/08/2006  . ANEMIA, VITAMIN B12 DEFICIENCY NEC 01/12/2007  . GLAUCOMA 05/08/2006  . HYPERTENSION 05/08/2006  . MENTAL RETARDATION 05/08/2006  . 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 PULMONARY NODULE 10/15/2007  .  RENAL INSUFFICIENCY 05/08/2006    PAST SURGICAL HISTORY:   Past Surgical History:  Procedure Laterality Date  . TONSILLECTOMY  1956    SOCIAL HISTORY:   Social History   Socioeconomic History  . Marital status: Widowed    Spouse name: Not on file  . Number of children: 0  . Years of education: 2nd grade  . Highest education level: Not on file  Occupational History  . Occupation: KITCHEN    Employer: [employer]  . Financial resource strain: Not on file  . Food insecurity:    Worry: Not on file    Inability: Not on file  . Transportation needs:    Medical:  Not on file    Non-medical: Not on file  Tobacco Use  . Smoking status: Former Smoker    Packs/day: 0.10    Years: 5.00    Pack years: 0.50    Types: Cigarettes    Last attempt to quit: 05/03/1988    Years since quitting: 29.9  . Smokeless tobacco: Never Used  Substance and Sexual Activity  . Alcohol use: No    Alcohol/week: 0.0 standard drinks  . Drug use: No  . Sexual activity: Not Currently  Lifestyle  . Physical activity:    Days per week: Not on file    Minutes per session: Not on file  . Stress: Not on file  Relationships  . Social connections:    Talks on phone: Not on file    Gets together: Not on file    Attends religious service: Not on file    Active member of club or organization: Not on file    Attends meetings of clubs or organizations: Not on file    Relationship status: Not on file  . Intimate partner violence:    Fear of current or ex partner: Not on file    Emotionally abused: Not on file    Physically abused: Not on file    Forced sexual activity: Not on file  Other Topics Concern  . Not on file  Social History Narrative   Patient lives [name] who seems to be the main Care Giver. The patient works at  [employer] at [employer].      Cannot read or write (except his name)    FAMILY HISTORY:   Family Status  Relation Name Status  . Mother  Alive       No current relationship  . Father  (Not Specified)       Unknown  . Brother  (Not Specified)  . Cousin maternal Alive  . Neg Hx  (Not Specified)    ROS:  Review of Systems  Constitutional: Negative.   HENT: Negative.   Eyes: Negative.   Cardiovascular: Negative.   Gastrointestinal: Negative.   Genitourinary: Negative.   Skin: Negative.   Neurological: Positive for tremors.  Endo/Heme/Allergies: Negative.     PHYSICAL EXAMINATION:    VITALS:   Vitals:   03/29/18 0957  BP: 92/60  Pulse: (!) 54  SpO2: 94%  Weight: 123 lb (55.8 kg)  Height: 5\' 5"  (1.651 m)    GEN:  The  patient appears stated age and is in NAD. HEENT:  Normocephalic, atraumatic.  The mucous membranes are moist. The superficial temporal arteries are without ropiness or tenderness. CV:  RRR Lungs:  CTAB Neck/HEME:  There are no carotid bruits bilaterally.  Neurological examination:  Orientation: The patient is alert and oriented x3. Looks to cousin for  finer aspects of history. Cranial nerves: There is good facial symmetry. Pupils are equal round and reactive to light bilaterally. Fundoscopic exam is attempted but the disc margins are not well visualized bilaterally due to cataract.   Extraocular muscles are intact. The visual fields are full to confrontational testing. The speech is fluent and clear. Soft palate rises symmetrically and there is no tongue deviation. Hearing is intact to conversational tone. Sensation: Sensation is intact to light and pinprick throughout (facial, trunk, extremities). Vibration is intact at the bilateral big toe. There is no extinction with double simultaneous stimulation. There is no sensory dermatomal level identified. Motor: Strength is 5/5 in the bilateral upper and lower extremities.   Shoulder shrug is equal and symmetric.  There is no pronator drift. Deep tendon reflexes: Deep tendon reflexes are 1/4 at the bilateral biceps, triceps, brachioradialis, patella and achilles. Plantar responses are downgoing bilaterally.  Movement examination: Tone: There is normal tone in the bilateral upper extremities.  The tone in the lower extremities is normal.  Abnormal movements: There is no rest tremor.  There is postural tremor in the thumb only on the left.  However, when he is given a weight, mild tremor is noted.  He does have trouble with Archimedes spirals bilaterally, left worse than right.  Tremor is noted when he is asked to pour water from one glass to another, but he does not spill a significant amount of the water. Coordination:  There is no decremation with  RAM's, with any form of RAMS, including alternating supination and pronation of the forearm, hand opening and closing, finger taps, heel taps and toe taps. Gait and Station: The patient has no difficulty arising out of a deep-seated chair without the use of the hands. The patient's stride length is normal.  He does have trouble ambulating in a tandem fashion.  Labs:    Chemistry      Component Value Date/Time   NA 143 10/18/2015 0859   NA 142 01/09/2015 0934   K 4.4 10/18/2015 0859   K 4.6 01/09/2015 0934   CL 102 10/18/2015 0859   CO2 25 10/18/2015 0859   CO2 31 (H) 01/09/2015 0934   BUN 23 10/18/2015 0859   BUN 30.7 (H) 01/09/2015 0934   CREATININE 2.02 (H) 10/18/2015 0859   CREATININE 2.2 (H) 01/09/2015 0934      Component Value Date/Time   CALCIUM 9.2 10/18/2015 0859   CALCIUM 9.1 01/09/2015 0934   ALKPHOS 42 01/09/2015 0934   AST 12 01/09/2015 0934   ALT 7 01/09/2015 0934   BILITOT 0.27 01/09/2015 0934     Lab Results  Component Value Date   WBC 3.7 01/06/2018   HGB 10.7 (L) 01/06/2018   HCT 29.0 (L) 03/05/2018   MCV 96 01/06/2018   PLT 201 01/06/2018   Lab Results  Component Value Date   VITAMINB12 272 02/22/2018   . Lab Results  Component Value Date   TSH 3.290 01/06/2018     ASSESSMENT/PLAN:  1.   Essential Tremor.  -This is evidenced by the symmetrical nature and longstanding hx of gradually getting worse.  We discussed nature and pathophysiology.  We discussed that this can continue to gradually get worse with time.  We discussed that some medications can worsen this, as can caffeine use.  We discussed medication therapy as well as surgical therapy.  Ultimately, the patient decided to try primidone.  We will start with 50 mg nightly.  Discussed extensively risk, benefits, and side  effects.  Understanding was expressed by patient and cousin.    -Does not look like he has had a chemistry for quite some time.  Appears that one was ordered by the cancer  center, but does not like like that is to be completed until next November.  If he does not have a chemistry before next visit, I will order one at next visit.  He has not had one since 2017.  He does have chronic renal insufficiency.  2.  B12 deficiency  -Recommend to start oral B12, 1000 mcg daily.  3.  MGUS  -Following at the cancer center yearly.  4.  I will see the patient back in 4 months, sooner should new neurologic issues arise.   Cc:  [name], MD

## 2018-03-29 ENCOUNTER — Ambulatory Visit (INDEPENDENT_AMBULATORY_CARE_PROVIDER_SITE_OTHER): Payer: Medicare Other | Admitting: Neurology

## 2018-03-29 ENCOUNTER — Encounter: Payer: Self-pay | Admitting: Neurology

## 2018-03-29 VITALS — BP 92/60 | HR 54 | Ht 65.0 in | Wt 123.0 lb

## 2018-03-29 DIAGNOSIS — G250 Essential tremor: Secondary | ICD-10-CM

## 2018-03-29 DIAGNOSIS — N289 Disorder of kidney and ureter, unspecified: Secondary | ICD-10-CM | POA: Diagnosis not present

## 2018-03-29 DIAGNOSIS — E538 Deficiency of other specified B group vitamins: Secondary | ICD-10-CM

## 2018-03-29 DIAGNOSIS — D472 Monoclonal gammopathy: Secondary | ICD-10-CM

## 2018-03-29 MED ORDER — PRIMIDONE 50 MG PO TABS: 50.0000 mg | ORAL_TABLET | Freq: Every day | ORAL | 1 refills | Status: DC

## 2018-03-29 NOTE — Patient Instructions (Signed)
Start primidone - 50 mg - 1/2 tablet at night for 4 nights and then 1 tablet nightly thereafter.

## 2018-06-04 DIAGNOSIS — Z961 Presence of intraocular lens: Secondary | ICD-10-CM | POA: Diagnosis not present

## 2018-06-04 DIAGNOSIS — H401133 Primary open-angle glaucoma, bilateral, severe stage: Secondary | ICD-10-CM | POA: Diagnosis not present

## 2018-07-05 ENCOUNTER — Encounter: Payer: Self-pay | Admitting: Internal Medicine

## 2018-07-05 ENCOUNTER — Ambulatory Visit (INDEPENDENT_AMBULATORY_CARE_PROVIDER_SITE_OTHER): Payer: Medicare Other | Admitting: Internal Medicine

## 2018-07-05 ENCOUNTER — Other Ambulatory Visit: Payer: Self-pay

## 2018-07-05 VITALS — BP 103/57 | HR 64 | Temp 98.0°F | Ht 65.0 in | Wt 121.1 lb

## 2018-07-05 DIAGNOSIS — D649 Anemia, unspecified: Secondary | ICD-10-CM | POA: Diagnosis not present

## 2018-07-05 DIAGNOSIS — N189 Chronic kidney disease, unspecified: Secondary | ICD-10-CM | POA: Diagnosis not present

## 2018-07-05 DIAGNOSIS — I10 Essential (primary) hypertension: Secondary | ICD-10-CM | POA: Diagnosis not present

## 2018-07-05 DIAGNOSIS — I129 Hypertensive chronic kidney disease with stage 1 through stage 4 chronic kidney disease, or unspecified chronic kidney disease: Secondary | ICD-10-CM | POA: Diagnosis not present

## 2018-07-05 DIAGNOSIS — Z79899 Other long term (current) drug therapy: Secondary | ICD-10-CM

## 2018-07-05 DIAGNOSIS — E538 Deficiency of other specified B group vitamins: Secondary | ICD-10-CM

## 2018-07-05 DIAGNOSIS — M170 Bilateral primary osteoarthritis of knee: Secondary | ICD-10-CM | POA: Diagnosis not present

## 2018-07-05 DIAGNOSIS — G250 Essential tremor: Secondary | ICD-10-CM

## 2018-07-05 DIAGNOSIS — J309 Allergic rhinitis, unspecified: Secondary | ICD-10-CM

## 2018-07-05 DIAGNOSIS — J301 Allergic rhinitis due to pollen: Secondary | ICD-10-CM

## 2018-07-05 DIAGNOSIS — Z9181 History of falling: Secondary | ICD-10-CM

## 2018-07-05 DIAGNOSIS — R251 Tremor, unspecified: Secondary | ICD-10-CM

## 2018-07-05 MED ORDER — VITAMIN B12 500 MCG PO TABS: 1000.0000 ug | ORAL_TABLET | Freq: Every day | ORAL | 1 refills | Status: DC

## 2018-07-05 MED ORDER — PROPRANOLOL HCL 20 MG PO TABS: 20.0000 mg | ORAL_TABLET | Freq: Two times a day (BID) | ORAL | 2 refills | Status: DC

## 2018-07-05 MED ORDER — CETIRIZINE HCL 10 MG PO TABS: ORAL_TABLET | ORAL | 3 refills | Status: AC

## 2018-07-05 MED ORDER — AMLODIPINE BESYLATE 5 MG PO TABS: 5.0000 mg | ORAL_TABLET | Freq: Every day | ORAL | 3 refills | Status: DC

## 2018-07-05 MED ORDER — PROPRANOLOL HCL 20 MG PO TABS: 20.0000 mg | ORAL_TABLET | Freq: Two times a day (BID) | ORAL | 3 refills | Status: AC

## 2018-07-05 MED ORDER — DICLOFENAC SODIUM 1 % TD GEL: 4.0000 g | Freq: Four times a day (QID) | TRANSDERMAL | 1 refills | Status: AC

## 2018-07-05 MED ORDER — FLUTICASONE PROPIONATE 50 MCG/ACT NA SUSP: 2.0000 | Freq: Every day | NASAL | 2 refills | Status: AC

## 2018-07-05 NOTE — Assessment & Plan Note (Signed)
Patient has chronic bilateral knee pain from osteoarthritis. He cannot take NSAIDs due to CKD. Pain is not severe enough for him to want intra articular knee injections. He takes tylenol occasionally.  -- Voltaren gel prn

## 2018-07-05 NOTE — Assessment & Plan Note (Signed)
Refilled cetirizine, 5 mg daily. Added flonase 2 sprays each nostril daily.

## 2018-07-05 NOTE — Assessment & Plan Note (Signed)
Patient is currently taking amlodipine 5 mg daily and propanolol 40 mg BID for essential tremor. This was increased last year for better control of his tremor. He has not noticed a difference. BP today is 103/57 and HR 64. Will decrease back to 20 mg BID dosing.  -- Decrease propanolol 20 mg BID -- Continue amlodipine 5 mg daily

## 2018-07-05 NOTE — Patient Instructions (Signed)
Jared Woodward,  It was a pleasure to see you again. I am glad you are doing well! For your blood pressure and tremor, please decrease your propanolol to 20 mg twice a day. I will call you with the results of your blood work.   For your knee pain, I have given you an antiinflammatory gel that you may use up to 4 times a day as needed. If you have any questions or concerns, call our clinic at [phone] or after hours call [phone] and ask for the internal medicine resident on call. Thank you!  Dr. Philipp Ovens

## 2018-07-05 NOTE — Progress Notes (Signed)
   CC: HTN follow up  HPI:  [name] is a 78 y.o. male with past medical history outlined below here for HTN follow up. For the details of today's visit, please refer to the assessment and plan.  Past Medical History:  Diagnosis Date  . AMAUROSIS FUGAX 12/24/2007  . ANEMIA OF CHRONIC DISEASE 05/08/2006  . ANEMIA, VITAMIN B12 DEFICIENCY NEC 01/12/2007  . GLAUCOMA 05/08/2006  . HYPERTENSION 05/08/2006  . MENTAL RETARDATION 05/08/2006  . 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 PULMONARY NODULE 10/15/2007  . RENAL INSUFFICIENCY 05/08/2006    Review of Systems  HENT: Positive for congestion.   Musculoskeletal: Positive for falls and joint pain.  Neurological: Negative for dizziness and loss of consciousness.    Physical Exam:  Vitals:   07/05/18 1314  BP: (!) 103/57  Pulse: 64  Temp: 98 F (36.7 C)  TempSrc: Oral  SpO2: 100%  Weight: 121 lb 1.6 oz (54.9 kg)  Height: 5\' 5"  (1.651 m)    Constitutional: NAD, appears comfortable Cardiovascular: RRR, no m/r/g Pulmonary/Chest: CTAB, no wheezes, rales, or rhonchi.  Extremities: Warm and well perfused. No edema.  Psychiatric: Normal mood and affect  Assessment & Plan:   See Encounters Tab for problem based charting.  Patient discussed with Dr. Rebeca Alert

## 2018-07-05 NOTE — Assessment & Plan Note (Addendum)
Patient has a history of vitamin B12 deficiency. Borderline Vit B12 levels in November but elevated MMA consistent with deficiency. He was started on supplements. He has been taking 500 mcg tablets, 1-2 daily. Will recheck level today.  -- F/u Vit B12, CBC -- Continue Vit B12 1,000 mg (2 tabs) daily   ADDENDUM: Vitamin B12 > 2,000. Called with results. Instructed patient he can decrease vitamin b12 supplements to the 100 mcg tablets, and take just 1 daily.

## 2018-07-06 LAB — VITAMIN B12: Vitamin B-12: >2000 pg/mL — ABNORMAL HIGH (ref 232–1245)

## 2018-07-06 LAB — CBC
Hematocrit: 30.8 % — ABNORMAL LOW (ref 37.5–51.0)
Hemoglobin: 10.9 g/dL — ABNORMAL LOW (ref 13.0–17.7)
MCH: 33.9 pg — ABNORMAL HIGH (ref 26.6–33.0)
MCHC: 35.4 g/dL (ref 31.5–35.7)
MCV: 96 fL (ref 79–97)
Platelets: 225 10*3/uL (ref 150–450)
RBC: 3.22 x10E6/uL — ABNORMAL LOW (ref 4.14–5.80)
RDW: 13 % (ref 11.6–15.4)
WBC: 3.6 10*3/uL (ref 3.4–10.8)

## 2018-07-06 LAB — BMP8+ANION GAP
ANION GAP: 18 mmol/L (ref 10.0–18.0)
BUN/Creatinine Ratio: 11 (ref 10–24)
BUN: 26 mg/dL (ref 8–27)
CALCIUM: 9 mg/dL (ref 8.6–10.2)
CHLORIDE: 101 mmol/L (ref 96–106)
CO2: 21 mmol/L (ref 20–29)
Creatinine, Ser: 2.45 mg/dL — ABNORMAL HIGH (ref 0.76–1.27)
GFR calc Af Amer: 29 mL/min/{1.73_m2} — ABNORMAL LOW (ref >59)
GFR calc non Af Amer: 25 mL/min/{1.73_m2} — ABNORMAL LOW (ref >59)
GLUCOSE: 105 mg/dL — AB (ref 65–99)
Potassium: 4.2 mmol/L (ref 3.5–5.2)
Sodium: 140 mmol/L (ref 134–144)

## 2018-07-07 NOTE — Progress Notes (Signed)
Internal Medicine Clinic Attending  Case discussed with Dr. Guilloud at the time of the visit.  We reviewed the resident's history and exam and pertinent patient test results.  I agree with the assessment, diagnosis, and plan of care documented in the resident's note.  Alexander Raines, M.D., Ph.D.  

## 2018-08-04 ENCOUNTER — Telehealth: Payer: Self-pay | Admitting: Neurology

## 2018-08-04 NOTE — Telephone Encounter (Signed)
Please advise 

## 2018-08-04 NOTE — Telephone Encounter (Signed)
LMOM for

## 2018-08-04 NOTE — Telephone Encounter (Signed)
Its not ideal but better than nothing.  Yes, we will try that.  Make sure that no one close to him (family member/child/neighbor, etc) has the capability for smart phone/evisit.

## 2018-08-04 NOTE — Telephone Encounter (Signed)
Called to schedule Evisit and patient does not have the capability for this. Would a Telephone visit be okay? Thanks!

## 2018-08-04 NOTE — Telephone Encounter (Signed)
Please schedule phone visit if no on around him has capability.  Thanks.

## 2018-08-16 NOTE — Progress Notes (Signed)
Virtual Visit via Telephone Note The purpose of this virtual visit is to provide medical care while limiting exposure to the novel coronavirus.    Consent was obtained for phone visit:  Yes.   Answered questions that patient had about telehealth interaction:  Yes.   I discussed the limitations, risks, security and privacy concerns of performing an evaluation and management service by telephone. I also discussed with the patient that there may be a patient responsible charge related to this service. The patient expressed understanding and agreed to proceed.  Pt location: Home Physician Location: office Name of referring provider:  [name], MD I connected with .Jared Woodward at patients initiation/request on 08/17/2018 at  1:00 PM EDT by telephone and verified that I am speaking with the correct person using two identifiers.  Pt MRN:  [id number] Pt DOB:  1940-08-07  Participants:  Jared Woodward, wife   History of Present Illness:  Patient is seen today in follow-up for essential tremor.  Wife supplements the history.  He was started on primidone, 50 mg daily at last visit.  He reports that it helps, but not enough.  He is still having tremor.  No SE with the medication.  Records have been reviewed since last visit.  He saw his primary care physician (internal medicine resident) on July 05, 2018.  His propranolol was decreased from 40 mg twice per day to 20 mg twice per day for hypertension.  He is still on amlodipine, 5 mg daily.   Observations/Objective:   Unable - phone visit Labs:    Chemistry      Component Value Date/Time   NA 140 07/05/2018 1352   NA 142 01/09/2015 0934   K 4.2 07/05/2018 1352   K 4.6 01/09/2015 0934   CL 101 07/05/2018 1352   CO2 21 07/05/2018 1352   CO2 31 (H) 01/09/2015 0934   BUN 26 07/05/2018 1352   BUN 30.7 (H) 01/09/2015 0934   CREATININE 2.45 (H) 07/05/2018 1352   CREATININE 2.2 (H) 01/09/2015 0934      Component Value Date/Time    CALCIUM 9.0 07/05/2018 1352   CALCIUM 9.1 01/09/2015 0934   ALKPHOS 42 01/09/2015 0934   AST 12 01/09/2015 0934   ALT 7 01/09/2015 0934   BILITOT 0.27 01/09/2015 0934     Lab Results  Component Value Date   VITAMINB12 >2000 (H) 07/05/2018     Assessment and Plan:   1.  Essential tremor  -increase Primidone, 50 mg bid.  Risks, benefits, side effects and alternative therapies were discussed.  The opportunity to ask questions was given and they were answered to the best of my ability.  The patient expressed understanding and willingness to follow the outlined treatment protocols.  -Discussed in detail Covid-19 and risk factors for this, including age and medical problems.  Discussed importance of social distancing.  Discussed importance of staying home at all times, as is feasible.  Discussed taking advantage of grocery store hours for the elderly.  Pt expressed understanding.   2.  History of B12 deficiency, now supratherapeutic on supplementation  -Told patient to reduce his B12 from 1000 mcg daily to 500 mcg every other day.  We will recheck in the future.  3.  MGUS  -Following with the cancer center.  Follow Up Instructions:    -I discussed the assessment and treatment plan with the patient. The patient was provided an opportunity to ask questions and all were answered. The patient agreed  with the plan and demonstrated an understanding of the instructions.   The patient was advised to call back or seek an in-person evaluation if the symptoms worsen or if the condition fails to improve as anticipated.    Total Time spent in visit with the patient was:  7 min, of which 100% of the time was spent in counseling and/or coordinating care on safety.   Pt understands and agrees with the plan of care outlined.     Alonza Bogus, DO

## 2018-08-17 ENCOUNTER — Telehealth (INDEPENDENT_AMBULATORY_CARE_PROVIDER_SITE_OTHER): Payer: Medicare Other | Admitting: Neurology

## 2018-08-17 ENCOUNTER — Other Ambulatory Visit: Payer: Self-pay

## 2018-08-17 ENCOUNTER — Encounter: Payer: Self-pay | Admitting: Neurology

## 2018-08-17 DIAGNOSIS — G250 Essential tremor: Secondary | ICD-10-CM

## 2018-08-17 DIAGNOSIS — E538 Deficiency of other specified B group vitamins: Secondary | ICD-10-CM

## 2018-08-17 MED ORDER — PRIMIDONE 50 MG PO TABS: 50.0000 mg | ORAL_TABLET | Freq: Two times a day (BID) | ORAL | 1 refills | Status: DC

## 2018-08-19 ENCOUNTER — Ambulatory Visit: Payer: Medicare Other | Admitting: Neurology

## 2018-09-14 ENCOUNTER — Other Ambulatory Visit: Payer: Self-pay | Admitting: Neurology

## 2018-09-21 ENCOUNTER — Other Ambulatory Visit: Payer: Self-pay | Admitting: Neurology

## 2018-09-21 NOTE — Telephone Encounter (Signed)
Requested Prescriptions   Pending Prescriptions Disp Refills  . primidone (MYSOLINE) 50 MG tablet [Pharmacy Med Name: Primidone 50 MG Oral Tablet] 90 tablet 0    Sig: TAKE 1 TABLET BY MOUTH AT BEDTIME   Rx last filled: 08/17/18 #180 1 REFILLS  Pt last seen:08/17/18  Follow up appt scheduled:02/18/19  DENIED RX REQUEST TO SOON

## 2018-09-24 ENCOUNTER — Other Ambulatory Visit: Payer: Self-pay | Admitting: Neurology

## 2018-09-27 ENCOUNTER — Other Ambulatory Visit: Payer: Self-pay | Admitting: Internal Medicine

## 2018-09-27 NOTE — Telephone Encounter (Signed)
Needs refill on primidone (MYSOLINE) 50 MG tablet [place] ([facility]), [place] - [place] ;pt contact [phone]

## 2018-09-27 NOTE — Telephone Encounter (Signed)
Primidone RX was refilled per Dr. Phillips Odor at [facility] neurology today. SChaplin, RN,BSN

## 2018-09-27 NOTE — Telephone Encounter (Signed)
Requested Prescriptions   Pending Prescriptions Disp Refills  . primidone (MYSOLINE) 50 MG tablet [Pharmacy Med Name: Primidone 50 MG Oral Tablet] 90 tablet 0    Sig: TAKE 1 TABLET BY MOUTH AT BEDTIME   Rx last filled:08/17/18 #180 1 refills  Pt last seen:08/17/18  Follow up appt scheduled:02/18/19

## 2018-10-04 ENCOUNTER — Encounter: Payer: Self-pay | Admitting: Internal Medicine

## 2018-10-04 ENCOUNTER — Other Ambulatory Visit: Payer: Self-pay

## 2018-10-04 ENCOUNTER — Ambulatory Visit (INDEPENDENT_AMBULATORY_CARE_PROVIDER_SITE_OTHER): Payer: Medicare Other | Admitting: Internal Medicine

## 2018-10-04 VITALS — BP 135/77 | HR 56 | Temp 98.2°F | Ht 65.0 in | Wt 122.0 lb

## 2018-10-04 DIAGNOSIS — R42 Dizziness and giddiness: Secondary | ICD-10-CM

## 2018-10-04 DIAGNOSIS — G250 Essential tremor: Secondary | ICD-10-CM | POA: Diagnosis not present

## 2018-10-04 DIAGNOSIS — Z79899 Other long term (current) drug therapy: Secondary | ICD-10-CM

## 2018-10-04 DIAGNOSIS — E538 Deficiency of other specified B group vitamins: Secondary | ICD-10-CM | POA: Diagnosis not present

## 2018-10-04 DIAGNOSIS — I10 Essential (primary) hypertension: Secondary | ICD-10-CM | POA: Diagnosis not present

## 2018-10-04 MED ORDER — VITAMIN B12 500 MCG PO TABS: 500.0000 ug | ORAL_TABLET | ORAL | 1 refills | Status: AC

## 2018-10-04 NOTE — Patient Instructions (Addendum)
Mr. Vanosdol,   It was a pleasure to see you. Please stop taking amlodipine. Continue to take propanolol twice a day. Please start taking your vitamin B12 every other day instead of daily. Follow up with me again in 6 months or sooner if you have any problems!   If you have any questions or concerns, call our clinic at [phone] or after hours call [phone] and ask for the internal medicine resident on call. Thank you!  Dr. Alesia Morin

## 2018-10-04 NOTE — Progress Notes (Signed)
   CC: Dizziness  HPI:  [name] is a 78 y.o. male with past medical history outlined below here for dizziness. For the details of today's visit, please refer to the assessment and plan.  Past Medical History:  Diagnosis Date  . AMAUROSIS FUGAX 12/24/2007  . ANEMIA OF CHRONIC DISEASE 05/08/2006  . ANEMIA, VITAMIN B12 DEFICIENCY NEC 01/12/2007  . GLAUCOMA 05/08/2006  . HYPERTENSION 05/08/2006  . MENTAL RETARDATION 05/08/2006  . 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 PULMONARY NODULE 10/15/2007  . RENAL INSUFFICIENCY 05/08/2006    Review of Systems  Respiratory: Negative for shortness of breath.   Cardiovascular: Negative for chest pain.  Neurological: Positive for dizziness. Negative for loss of consciousness.    Physical Exam:  Vitals:   10/04/18 1424  BP: 135/77  Pulse: (!) 56  Temp: 98.2 F (36.8 C)  TempSrc: Oral  SpO2: 100%  Weight: 122 lb (55.3 kg)  Height: 5\' 5"  (1.651 m)    Constitutional: NAD, appears comfortable Cardiovascular: RRR, no m/r/g Pulmonary/Chest: CTAB, no wheezes, rales, or rhonchi.  Extremities: Warm and well perfused. No edema.  Psychiatric: Normal mood and affect  Assessment & Plan:   See Encounters Tab for problem based charting.  Patient discussed with Dr. Lynnae January

## 2018-10-04 NOTE — Assessment & Plan Note (Addendum)
Patient continues to complain of postural dizziness that occurs in the morning when he gets out of bed.  It is transient improves after few minutes.  He denies falls and loss of consciousness.  Orthostatic vitals today are negative.  He was hypotensive at his last visit and his propanolol dosing was decreased to 20 mg twice daily.  Blood pressure today is 135/77.  He is also taking amlodipine 5 mg daily.  With his ongoing symptoms, we discussed discontinuing amlodipine.  Given his advanced age I think it is reasonable for him to have higher blood pressure goal of <140/90, and possibly higher if he has ongoing symptoms. -Discontinue amlodipine -Continue propanolol 20 mg twice daily -Monitor symptoms of dizziness

## 2018-10-04 NOTE — Assessment & Plan Note (Signed)
Patient has essential tremor and was started on primidone per neurology.  Patient feels that this is improving his symptoms. -Continue primidone and propanolol  -- follow-up neurology

## 2018-10-04 NOTE — Assessment & Plan Note (Signed)
Patient has a history of vitamin B12 deficiency.  Last level checked 3 months ago was supra therapeutic with Vit B12 >2,000.  Neurology had instructed patient to decrease vitamin B12 tablets (500mg ) to every other day.  Patient was unaware of this and continues to take it daily.  Instructed patient to decrease every other day.  We will repeat level in future to ensure he does not become subtherapeutic.

## 2018-10-08 NOTE — Progress Notes (Signed)
Internal Medicine Clinic Attending  Case discussed with Dr. Guilloud at the time of the visit.  We reviewed the resident's history and exam and pertinent patient test results.  I agree with the assessment, diagnosis, and plan of care documented in the resident's note.  

## 2018-10-23 ENCOUNTER — Ambulatory Visit (HOSPITAL_COMMUNITY)
Admission: EM | Admit: 2018-10-23 | Discharge: 2018-10-23 | Disposition: A | Discharge status: Home / Self Care | Payer: Medicare Other | Attending: Family Medicine | Admitting: Family Medicine

## 2018-10-23 ENCOUNTER — Encounter (HOSPITAL_COMMUNITY): Payer: Self-pay

## 2018-10-23 ENCOUNTER — Other Ambulatory Visit: Payer: Self-pay

## 2018-10-23 ENCOUNTER — Ambulatory Visit (INDEPENDENT_AMBULATORY_CARE_PROVIDER_SITE_OTHER): Payer: Medicare Other

## 2018-10-23 DIAGNOSIS — R079 Chest pain, unspecified: Secondary | ICD-10-CM | POA: Diagnosis not present

## 2018-10-23 DIAGNOSIS — R0789 Other chest pain: Secondary | ICD-10-CM

## 2018-10-23 DIAGNOSIS — I10 Essential (primary) hypertension: Secondary | ICD-10-CM

## 2018-10-23 DIAGNOSIS — W2210XA Striking against or struck by unspecified automobile airbag, initial encounter: Secondary | ICD-10-CM | POA: Diagnosis not present
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
## 2018-10-23 NOTE — Discharge Instructions (Signed)
For pain I recommend Tylenol 500 mg every 6 hours as needed.   Recommend rest and hydration.
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
## 2018-11-01 ENCOUNTER — Other Ambulatory Visit: Payer: Self-pay | Admitting: Neurology

## 2018-11-01 NOTE — Telephone Encounter (Signed)
Requested Prescriptions   Pending Prescriptions Disp Refills  . primidone (MYSOLINE) 50 MG tablet [Pharmacy Med Name: Primidone 50 MG Oral Tablet] 90 tablet 0    Sig: TAKE 1 TABLET BY MOUTH AT BEDTIME   Rx last filled:09/27/18 #90 0 refills  Pt last seen:08/17/18  Follow up appt scheduled:02/18/19

## 2018-11-08 DIAGNOSIS — D631 Anemia in chronic kidney disease: Secondary | ICD-10-CM | POA: Diagnosis not present

## 2018-11-08 DIAGNOSIS — I129 Hypertensive chronic kidney disease with stage 1 through stage 4 chronic kidney disease, or unspecified chronic kidney disease: Secondary | ICD-10-CM | POA: Diagnosis not present

## 2018-11-08 DIAGNOSIS — G250 Essential tremor: Secondary | ICD-10-CM | POA: Diagnosis not present

## 2018-11-08 DIAGNOSIS — N184 Chronic kidney disease, stage 4 (severe): Secondary | ICD-10-CM | POA: Diagnosis not present

## 2018-11-08 DIAGNOSIS — N2581 Secondary hyperparathyroidism of renal origin: Secondary | ICD-10-CM | POA: Diagnosis not present

## 2018-12-02 DIAGNOSIS — Z961 Presence of intraocular lens: Secondary | ICD-10-CM | POA: Diagnosis not present

## 2018-12-02 DIAGNOSIS — H401133 Primary open-angle glaucoma, bilateral, severe stage: Secondary | ICD-10-CM | POA: Diagnosis not present

## 2019-02-08 ENCOUNTER — Ambulatory Visit (INDEPENDENT_AMBULATORY_CARE_PROVIDER_SITE_OTHER): Payer: Medicare Other | Admitting: *Deleted

## 2019-02-08 DIAGNOSIS — Z23 Encounter for immunization: Secondary | ICD-10-CM

## 2019-02-17 ENCOUNTER — Ambulatory Visit: Payer: Medicare Other | Admitting: Neurology

## 2019-02-18 ENCOUNTER — Ambulatory Visit: Payer: Medicare Other | Admitting: Neurology

## 2019-03-17 ENCOUNTER — Telehealth: Payer: Self-pay | Admitting: Oncology

## 2019-03-17 ENCOUNTER — Inpatient Hospital Stay: Payer: Medicare Other | Attending: Oncology | Admitting: Oncology

## 2019-03-17 ENCOUNTER — Other Ambulatory Visit: Payer: Self-pay

## 2019-03-17 ENCOUNTER — Inpatient Hospital Stay: Payer: Medicare Other

## 2019-03-17 VITALS — BP 159/78 | HR 67 | Temp 97.9°F | Resp 17 | Ht 65.0 in | Wt 120.9 lb

## 2019-03-17 DIAGNOSIS — D472 Monoclonal gammopathy: Secondary | ICD-10-CM

## 2019-03-17 DIAGNOSIS — N189 Chronic kidney disease, unspecified: Secondary | ICD-10-CM | POA: Diagnosis not present

## 2019-03-17 DIAGNOSIS — D649 Anemia, unspecified: Secondary | ICD-10-CM | POA: Diagnosis not present

## 2019-03-17 DIAGNOSIS — Z79899 Other long term (current) drug therapy: Secondary | ICD-10-CM | POA: Insufficient documentation

## 2019-03-17 LAB — CMP (CANCER CENTER ONLY)
ALT: 13 U/L (ref 0–44)
AST: 15 U/L (ref 15–41)
Albumin: 3.4 g/dL — ABNORMAL LOW (ref 3.5–5.0)
Alkaline Phosphatase: 79 U/L (ref 38–126)
Anion gap: 9 (ref 5–15)
BUN: 20 mg/dL (ref 8–23)
CO2: 30 mmol/L (ref 22–32)
Calcium: 8.7 mg/dL — ABNORMAL LOW (ref 8.9–10.3)
Chloride: 105 mmol/L (ref 98–111)
Creatinine: 2.1 mg/dL — ABNORMAL HIGH (ref 0.61–1.24)
GFR, Est AFR Am: 34 mL/min — ABNORMAL LOW (ref >60)
GFR, Estimated: 30 mL/min — ABNORMAL LOW (ref >60)
Glucose, Bld: 92 mg/dL (ref 70–99)
Potassium: 3.6 mmol/L (ref 3.5–5.1)
Sodium: 144 mmol/L (ref 135–145)
Total Bilirubin: 0.3 mg/dL (ref 0.3–1.2)
Total Protein: 6.7 g/dL (ref 6.5–8.1)

## 2019-03-17 LAB — CBC WITH DIFFERENTIAL (CANCER CENTER ONLY)
Abs Immature Granulocytes: 0.01 10*3/uL (ref 0.00–0.07)
Basophils Absolute: 0 10*3/uL (ref 0.0–0.1)
Basophils Relative: 0 %
Eosinophils Absolute: 0 10*3/uL (ref 0.0–0.5)
Eosinophils Relative: 1 %
HCT: 31 % — ABNORMAL LOW (ref 39.0–52.0)
Hemoglobin: 10.1 g/dL — ABNORMAL LOW (ref 13.0–17.0)
Immature Granulocytes: 0 %
Lymphocytes Relative: 52 %
Lymphs Abs: 2 10*3/uL (ref 0.7–4.0)
MCH: 32.2 pg (ref 26.0–34.0)
MCHC: 32.6 g/dL (ref 30.0–36.0)
MCV: 98.7 fL (ref 80.0–100.0)
Monocytes Absolute: 0.3 10*3/uL (ref 0.1–1.0)
Monocytes Relative: 8 %
Neutro Abs: 1.6 10*3/uL — ABNORMAL LOW (ref 1.7–7.7)
Neutrophils Relative %: 39 %
Platelet Count: 204 10*3/uL (ref 150–400)
RBC: 3.14 MIL/uL — ABNORMAL LOW (ref 4.22–5.81)
RDW: 13.2 % (ref 11.5–15.5)
WBC Count: 3.9 10*3/uL — ABNORMAL LOW (ref 4.0–10.5)
nRBC: 0 % (ref 0.0–0.2)

## 2019-03-17 NOTE — Progress Notes (Signed)
Hematology and Oncology Follow Up Visit  Jared Woodward [id number] [birth year] 78 y.o. 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   Current therapy: Active surveillance.  Interim History:  Jared Woodward is here for a follow-up visit.  Since the last visit, he reports no major changes in his health.  He continues to be active and attends activities of daily living.  He denies any bone pain or pathological fractures.  He denies any recent hospitalizations or illnesses.  Denies any peripheral neuropathy or recurrent infections.  He denied any alteration mental status, neuropathy, confusion or dizziness.  Denies any headaches or lethargy.  Denies any night sweats, weight loss or changes in appetite.  Denied orthopnea, dyspnea on exertion or chest discomfort.  Denies shortness of breath, difficulty breathing hemoptysis or cough.  Denies any abdominal distention, nausea, early satiety or dyspepsia.  Denies any hematuria, frequency, dysuria or nocturia.  Denies any skin irritation, dryness or rash.  Denies any ecchymosis or petechiae.  Denies any lymphadenopathy or clotting.  Denies any heat or cold intolerance.  Denies any anxiety or depression.  Remaining review of system is negative.     Medications: Updated without any changes. Current Outpatient Medications  Medication Sig Dispense Refill  . aspirin 81 MG tablet Take 81 mg by mouth daily.    . brimonidine (ALPHAGAN) 0.2 % ophthalmic solution INSTILL 1 DROP INTO EACH EYE TWICE DAILY  3  . cetirizine (ZYRTEC) 10 MG tablet Take 1/2 tablet daily 45 tablet 3  . Cholecalciferol (VITAMIN D3) 1000 units CAPS Take 2,000 tablets by mouth daily.    . Cyanocobalamin (VITAMIN B12) 500 MCG TABS Take 500 mcg by mouth See admin instructions. Take one tablet every other day 60 tablet 1  . diclofenac sodium (VOLTAREN) 1 % GEL Apply  4 g topically 4 (four) times daily. 100 g 1  . fluticasone (FLONASE) 50 MCG/ACT nasal spray Place 2 sprays into both nostrils daily. 16 g 2  . primidone (MYSOLINE) 50 MG tablet TAKE 1 TABLET BY MOUTH AT BEDTIME 90 tablet 0  . propranolol (INDERAL) 20 MG tablet Take 1 tablet (20 mg total) by mouth 2 (two) times daily. 180 tablet 3  . rosuvastatin (CRESTOR) 10 MG tablet Take 1 tablet (10 mg total) by mouth at bedtime. (Patient not taking: Reported on 08/17/2018) 30 tablet 11  . Travoprost, BAK Free, (TRAVATAN Z) 0.004 % SOLN ophthalmic solution Place 1 drop into both eyes at bedtime. 1 Bottle 2   No current facility-administered medications for this visit.      Allergies:  Allergies  Allergen Reactions  . Lisinopril Cough    Past Medical History, Surgical history, Social history, and Family History without any changes on review.   Physical Exam:  Blood pressure (!) 159/78, pulse 67, temperature 97.9 F (36.6 C), temperature source Temporal, resp. rate 17, height 5' 5"  (1.651 m), weight 120 lb 14.4 oz (54.8 kg), SpO2 99 %.   ECOG: 1    General appearance: Alert, awake without any distress. Head: Atraumatic without abnormalities Oropharynx: Without any thrush or ulcers. Eyes: No scleral icterus. Lymph nodes: No lymphadenopathy noted in the cervical, supraclavicular, or axillary nodes Heart:regular rate and rhythm, without any murmurs or gallops.   Lung: Clear to auscultation without any rhonchi, wheezes or dullness to percussion. Abdomin: Soft, nontender without any shifting dullness or ascites. Musculoskeletal: No clubbing or  cyanosis. Neurological: No motor or sensory deficits. Skin: No rashes or lesions.    Lab Results: Lab Results  Component Value Date   WBC 3.6 07/05/2018   HGB 10.9 (L) 07/05/2018   HCT 30.8 (L) 07/05/2018   MCV 96 07/05/2018   PLT 225 07/05/2018     Chemistry      Component Value Date/Time   NA 140 07/05/2018 1352   NA 142 01/09/2015 0934    K 4.2 07/05/2018 1352   K 4.6 01/09/2015 0934   CL 101 07/05/2018 1352   CO2 21 07/05/2018 1352   CO2 31 (H) 01/09/2015 0934   BUN 26 07/05/2018 1352   BUN 30.7 (H) 01/09/2015 0934   CREATININE 2.45 (H) 07/05/2018 1352   CREATININE 2.2 (H) 01/09/2015 0934      Component Value Date/Time   CALCIUM 9.0 07/05/2018 1352   CALCIUM 9.1 01/09/2015 0934   ALKPHOS 42 01/09/2015 0934   AST 12 01/09/2015 0934   ALT 7 01/09/2015 0934   BILITOT 0.27 01/09/2015 0934           Impression and Plan:  78 year old man with the following:  1.  Monoclonal gammopathy of undetermined significance diagnosed in 2009.  He was found to have IgA kappa subtype with small M spike that has not changed over the years.  He remains on active surveillance at this time without any indication for evolving into multiple myeloma.  Indication for treatment would include clear endorgan damage related to plasma cell disorder clinic hypercalcemia and worsening anemia.  For the time being I see no indication for treatment and I recommended active surveillance.  Protein studies are currently pending from today but have been unchanged for the last 10 years.  His risk of progression remains at 1 %/year.   2.  Chronic renal insufficiency: He continues to follow with nephrology without any changes in his kidney function.  His renal insufficiency is unrelated to plasma cell disorder.  3.  Anemia: Hemoglobin remained stable at this time does not require any growth factor support.  4.  Follow-up: In 1 year for repeat evaluation.  15  minutes was spent with the patient face-to-face today.  More than 50% of time was spent on reviewing the natural course of his disease, risk of progression as well as reviewing laboratory data and future plan of care.       Zola Button, MD 11/19/20208:51 AM

## 2019-03-17 NOTE — Telephone Encounter (Signed)
Scheduled appt per 11/19 los.  Sent a staff message to get a calendar mailed out.

## 2019-03-18 LAB — KAPPA/LAMBDA LIGHT CHAINS
Kappa free light chain: 48.4 mg/L — ABNORMAL HIGH (ref 3.3–19.4)
Kappa, lambda light chain ratio: 1.31 (ref 0.26–1.65)
Lambda free light chains: 37 mg/L — ABNORMAL HIGH (ref 5.7–26.3)

## 2019-03-21 LAB — MULTIPLE MYELOMA PANEL, SERUM
Albumin SerPl Elph-Mcnc: 3.2 g/dL (ref 2.9–4.4)
Albumin/Glob SerPl: 1.2 (ref 0.7–1.7)
Alpha 1: 0.2 g/dL (ref 0.0–0.4)
Alpha2 Glob SerPl Elph-Mcnc: 0.6 g/dL (ref 0.4–1.0)
B-Globulin SerPl Elph-Mcnc: 0.8 g/dL (ref 0.7–1.3)
Gamma Glob SerPl Elph-Mcnc: 1.3 g/dL (ref 0.4–1.8)
Globulin, Total: 2.8 g/dL (ref 2.2–3.9)
IgA: 562 mg/dL — ABNORMAL HIGH (ref 61–437)
IgG (Immunoglobin G), Serum: 1050 mg/dL (ref 603–1613)
IgM (Immunoglobulin M), Srm: 32 mg/dL (ref 15–143)
M Protein SerPl Elph-Mcnc: 0.5 g/dL — ABNORMAL HIGH
Total Protein ELP: 6 g/dL (ref 6.0–8.5)

## 2019-04-04 ENCOUNTER — Other Ambulatory Visit: Payer: Self-pay | Admitting: Neurology

## 2019-06-01 DIAGNOSIS — Z961 Presence of intraocular lens: Secondary | ICD-10-CM | POA: Diagnosis not present

## 2019-06-01 DIAGNOSIS — H401133 Primary open-angle glaucoma, bilateral, severe stage: Secondary | ICD-10-CM | POA: Diagnosis not present

## 2019-06-06 DIAGNOSIS — N184 Chronic kidney disease, stage 4 (severe): Secondary | ICD-10-CM | POA: Diagnosis not present
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
## 2019-06-14 ENCOUNTER — Telehealth (INDEPENDENT_AMBULATORY_CARE_PROVIDER_SITE_OTHER): Payer: Medicare Other | Admitting: Neurology

## 2019-06-14 ENCOUNTER — Other Ambulatory Visit: Payer: Self-pay

## 2019-06-14 DIAGNOSIS — D631 Anemia in chronic kidney disease: Secondary | ICD-10-CM | POA: Diagnosis not present

## 2019-06-14 DIAGNOSIS — N2581 Secondary hyperparathyroidism of renal origin: Secondary | ICD-10-CM | POA: Diagnosis not present

## 2019-06-14 DIAGNOSIS — G250 Essential tremor: Secondary | ICD-10-CM | POA: Diagnosis not present

## 2019-06-14 DIAGNOSIS — N184 Chronic kidney disease, stage 4 (severe): Secondary | ICD-10-CM | POA: Diagnosis not present

## 2019-06-14 DIAGNOSIS — I129 Hypertensive chronic kidney disease with stage 1 through stage 4 chronic kidney disease, or unspecified chronic kidney disease: Secondary | ICD-10-CM | POA: Diagnosis not present

## 2019-06-14 MED ORDER — PRIMIDONE 50 MG PO TABS: 50.0000 mg | ORAL_TABLET | Freq: Two times a day (BID) | ORAL | 2 refills | Status: AC

## 2019-06-26 ENCOUNTER — Other Ambulatory Visit: Payer: Self-pay | Admitting: Internal Medicine

## 2019-06-26 DIAGNOSIS — I10 Essential (primary) hypertension: Secondary | ICD-10-CM

## 2019-06-30 ENCOUNTER — Ambulatory Visit: Payer: Medicare Other | Attending: Internal Medicine

## 2019-06-30 DIAGNOSIS — Z23 Encounter for immunization: Secondary | ICD-10-CM | POA: Insufficient documentation

## 2019-06-30 NOTE — Progress Notes (Signed)
   Covid-19 Vaccination Clinic  Name:  MARSHEL GOLUBSKI    MRN: [id number] DOB: Sep 01, 1940  06/30/2019  Mr. Petrovich was observed post Covid-19 immunization for 15 minutes without incident. He was provided with Vaccine Information Sheet and instruction to access the V-Safe system.   Mr. Frick was instructed to call 911 with any severe reactions post vaccine: Marland Kitchen Difficulty breathing  . Swelling of face and throat  . A fast heartbeat  . A bad rash all over body  . Dizziness and weakness   Immunizations Administered    Name Date Dose VIS Date Route   Pfizer COVID-19 Vaccine 06/30/2019 12:52 PM 0.3 mL 04/08/2019 Intramuscular   Manufacturer: [place]   Lot: [id number]   [place]: [address]-4

## 2019-07-01 ENCOUNTER — Other Ambulatory Visit: Payer: Self-pay | Admitting: Internal Medicine

## 2019-07-01 DIAGNOSIS — I10 Essential (primary) hypertension: Secondary | ICD-10-CM

## 2019-07-11 ENCOUNTER — Emergency Department (HOSPITAL_COMMUNITY): Payer: Medicare Other

## 2019-07-11 ENCOUNTER — Other Ambulatory Visit: Payer: Self-pay | Admitting: Internal Medicine

## 2019-07-11 ENCOUNTER — Inpatient Hospital Stay (HOSPITAL_COMMUNITY)
Admission: EM | Admit: 2019-07-11 | Discharge: 2019-07-28 | DRG: 064 | Disposition: E | Payer: Medicare Other | Attending: Neurology | Admitting: Neurology

## 2019-07-11 DIAGNOSIS — E785 Hyperlipidemia, unspecified: Secondary | ICD-10-CM | POA: Diagnosis present

## 2019-07-11 DIAGNOSIS — J155 Pneumonia due to Escherichia coli: Secondary | ICD-10-CM | POA: Diagnosis present

## 2019-07-11 DIAGNOSIS — G250 Essential tremor: Secondary | ICD-10-CM | POA: Diagnosis not present

## 2019-07-11 DIAGNOSIS — R29810 Facial weakness: Secondary | ICD-10-CM | POA: Diagnosis not present

## 2019-07-11 DIAGNOSIS — G8191 Hemiplegia, unspecified affecting right dominant side: Secondary | ICD-10-CM | POA: Diagnosis not present

## 2019-07-11 DIAGNOSIS — Z515 Encounter for palliative care: Secondary | ICD-10-CM | POA: Diagnosis not present

## 2019-07-11 DIAGNOSIS — I161 Hypertensive emergency: Secondary | ICD-10-CM

## 2019-07-11 DIAGNOSIS — R1312 Dysphagia, oropharyngeal phase: Secondary | ICD-10-CM | POA: Diagnosis not present

## 2019-07-11 DIAGNOSIS — I611 Nontraumatic intracerebral hemorrhage in hemisphere, cortical: Secondary | ICD-10-CM

## 2019-07-11 DIAGNOSIS — E878 Other disorders of electrolyte and fluid balance, not elsewhere classified: Secondary | ICD-10-CM | POA: Diagnosis present

## 2019-07-11 DIAGNOSIS — F79 Unspecified intellectual disabilities: Secondary | ICD-10-CM

## 2019-07-11 DIAGNOSIS — Z79899 Other long term (current) drug therapy: Secondary | ICD-10-CM

## 2019-07-11 DIAGNOSIS — D696 Thrombocytopenia, unspecified: Secondary | ICD-10-CM | POA: Diagnosis present

## 2019-07-11 DIAGNOSIS — D638 Anemia in other chronic diseases classified elsewhere: Secondary | ICD-10-CM | POA: Diagnosis present

## 2019-07-11 DIAGNOSIS — Z978 Presence of other specified devices: Secondary | ICD-10-CM

## 2019-07-11 DIAGNOSIS — G936 Cerebral edema: Secondary | ICD-10-CM | POA: Diagnosis not present

## 2019-07-11 DIAGNOSIS — Z681 Body mass index (BMI) 19 or less, adult: Secondary | ICD-10-CM

## 2019-07-11 DIAGNOSIS — R509 Fever, unspecified: Secondary | ICD-10-CM | POA: Diagnosis not present

## 2019-07-11 DIAGNOSIS — I69391 Dysphagia following cerebral infarction: Secondary | ICD-10-CM

## 2019-07-11 DIAGNOSIS — Z87891 Personal history of nicotine dependence: Secondary | ICD-10-CM | POA: Diagnosis not present

## 2019-07-11 DIAGNOSIS — Z66 Do not resuscitate: Secondary | ICD-10-CM | POA: Diagnosis not present

## 2019-07-11 DIAGNOSIS — J9601 Acute respiratory failure with hypoxia: Secondary | ICD-10-CM | POA: Diagnosis present

## 2019-07-11 DIAGNOSIS — N184 Chronic kidney disease, stage 4 (severe): Secondary | ICD-10-CM | POA: Diagnosis not present

## 2019-07-11 DIAGNOSIS — I10 Essential (primary) hypertension: Secondary | ICD-10-CM | POA: Diagnosis not present

## 2019-07-11 DIAGNOSIS — I351 Nonrheumatic aortic (valve) insufficiency: Secondary | ICD-10-CM | POA: Diagnosis not present

## 2019-07-11 DIAGNOSIS — I619 Nontraumatic intracerebral hemorrhage, unspecified: Secondary | ICD-10-CM | POA: Diagnosis present

## 2019-07-11 DIAGNOSIS — Z20822 Contact with and (suspected) exposure to covid-19: Secondary | ICD-10-CM | POA: Diagnosis not present

## 2019-07-11 DIAGNOSIS — J9602 Acute respiratory failure with hypercapnia: Secondary | ICD-10-CM | POA: Diagnosis not present

## 2019-07-11 DIAGNOSIS — I129 Hypertensive chronic kidney disease with stage 1 through stage 4 chronic kidney disease, or unspecified chronic kidney disease: Secondary | ICD-10-CM | POA: Diagnosis present

## 2019-07-11 DIAGNOSIS — G935 Compression of brain: Secondary | ICD-10-CM | POA: Diagnosis not present

## 2019-07-11 DIAGNOSIS — R4701 Aphasia: Secondary | ICD-10-CM | POA: Diagnosis present

## 2019-07-11 DIAGNOSIS — R1310 Dysphagia, unspecified: Secondary | ICD-10-CM | POA: Diagnosis not present

## 2019-07-11 DIAGNOSIS — I629 Nontraumatic intracranial hemorrhage, unspecified: Secondary | ICD-10-CM | POA: Diagnosis not present

## 2019-07-11 DIAGNOSIS — E870 Hyperosmolality and hypernatremia: Secondary | ICD-10-CM | POA: Diagnosis present

## 2019-07-11 DIAGNOSIS — I615 Nontraumatic intracerebral hemorrhage, intraventricular: Principal | ICD-10-CM | POA: Diagnosis present

## 2019-07-11 DIAGNOSIS — Z7982 Long term (current) use of aspirin: Secondary | ICD-10-CM | POA: Diagnosis not present

## 2019-07-11 DIAGNOSIS — R29736 NIHSS score 36: Secondary | ICD-10-CM | POA: Diagnosis not present

## 2019-07-11 DIAGNOSIS — R4020 Unspecified coma: Secondary | ICD-10-CM | POA: Diagnosis not present

## 2019-07-11 DIAGNOSIS — R404 Transient alteration of awareness: Secondary | ICD-10-CM | POA: Diagnosis not present

## 2019-07-11 DIAGNOSIS — R0689 Other abnormalities of breathing: Secondary | ICD-10-CM | POA: Diagnosis not present

## 2019-07-11 DIAGNOSIS — G453 Amaurosis fugax: Secondary | ICD-10-CM | POA: Diagnosis present

## 2019-07-11 DIAGNOSIS — J189 Pneumonia, unspecified organism: Secondary | ICD-10-CM | POA: Diagnosis present

## 2019-07-11 DIAGNOSIS — E538 Deficiency of other specified B group vitamins: Secondary | ICD-10-CM | POA: Diagnosis present

## 2019-07-11 DIAGNOSIS — I610 Nontraumatic intracerebral hemorrhage in hemisphere, subcortical: Secondary | ICD-10-CM | POA: Diagnosis not present

## 2019-07-11 DIAGNOSIS — R918 Other nonspecific abnormal finding of lung field: Secondary | ICD-10-CM | POA: Diagnosis not present

## 2019-07-11 DIAGNOSIS — H409 Unspecified glaucoma: Secondary | ICD-10-CM | POA: Diagnosis present

## 2019-07-11 DIAGNOSIS — R29818 Other symptoms and signs involving the nervous system: Secondary | ICD-10-CM | POA: Diagnosis not present

## 2019-07-11 DIAGNOSIS — D472 Monoclonal gammopathy: Secondary | ICD-10-CM | POA: Diagnosis present

## 2019-07-11 DIAGNOSIS — I639 Cerebral infarction, unspecified: Secondary | ICD-10-CM | POA: Diagnosis not present

## 2019-07-11 DIAGNOSIS — I618 Other nontraumatic intracerebral hemorrhage: Secondary | ICD-10-CM | POA: Diagnosis not present

## 2019-07-11 DIAGNOSIS — E440 Moderate protein-calorie malnutrition: Secondary | ICD-10-CM | POA: Diagnosis not present

## 2019-07-11 DIAGNOSIS — J9811 Atelectasis: Secondary | ICD-10-CM | POA: Diagnosis not present

## 2019-07-11 DIAGNOSIS — R4781 Slurred speech: Secondary | ICD-10-CM | POA: Diagnosis not present

## 2019-07-11 DIAGNOSIS — N183 Chronic kidney disease, stage 3 unspecified: Secondary | ICD-10-CM | POA: Diagnosis present

## 2019-07-11 DIAGNOSIS — Z743 Need for continuous supervision: Secondary | ICD-10-CM | POA: Diagnosis not present

## 2019-07-11 DIAGNOSIS — J9600 Acute respiratory failure, unspecified whether with hypoxia or hypercapnia: Secondary | ICD-10-CM | POA: Diagnosis not present

## 2019-07-11 DIAGNOSIS — I361 Nonrheumatic tricuspid (valve) insufficiency: Secondary | ICD-10-CM | POA: Diagnosis not present

## 2019-07-11 LAB — DIFFERENTIAL
Abs Immature Granulocytes: 0.02 10*3/uL (ref 0.00–0.07)
Basophils Absolute: 0 10*3/uL (ref 0.0–0.1)
Basophils Relative: 0 %
Eosinophils Absolute: 0 10*3/uL (ref 0.0–0.5)
Eosinophils Relative: 0 %
Immature Granulocytes: 0 %
Lymphocytes Relative: 31 %
Lymphs Abs: 2.4 10*3/uL (ref 0.7–4.0)
Monocytes Absolute: 0.3 10*3/uL (ref 0.1–1.0)
Monocytes Relative: 4 %
Neutro Abs: 5.1 10*3/uL (ref 1.7–7.7)
Neutrophils Relative %: 65 %

## 2019-07-11 LAB — COMPREHENSIVE METABOLIC PANEL
ALT: 21 U/L (ref 0–44)
AST: 21 U/L (ref 15–41)
Albumin: 3.3 g/dL — ABNORMAL LOW (ref 3.5–5.0)
Alkaline Phosphatase: 80 U/L (ref 38–126)
Anion gap: 10 (ref 5–15)
BUN: 20 mg/dL (ref 8–23)
CO2: 27 mmol/L (ref 22–32)
Calcium: 9.1 mg/dL (ref 8.9–10.3)
Chloride: 104 mmol/L (ref 98–111)
Creatinine, Ser: 2.18 mg/dL — ABNORMAL HIGH (ref 0.61–1.24)
GFR calc Af Amer: 33 mL/min — ABNORMAL LOW (ref >60)
GFR calc non Af Amer: 28 mL/min — ABNORMAL LOW (ref >60)
Glucose, Bld: 96 mg/dL (ref 70–99)
Potassium: 5 mmol/L (ref 3.5–5.1)
Sodium: 141 mmol/L (ref 135–145)
Total Bilirubin: 0.4 mg/dL (ref 0.3–1.2)
Total Protein: 6.7 g/dL (ref 6.5–8.1)

## 2019-07-11 LAB — POCT I-STAT 7, (LYTES, BLD GAS, ICA,H+H)
Acid-Base Excess: 5 mmol/L — ABNORMAL HIGH (ref 0.0–2.0)
Bicarbonate: 27.8 mmol/L (ref 20.0–28.0)
Calcium, Ion: 1.21 mmol/L (ref 1.15–1.40)
HCT: 31 % — ABNORMAL LOW (ref 39.0–52.0)
Hemoglobin: 10.5 g/dL — ABNORMAL LOW (ref 13.0–17.0)
O2 Saturation: 100 %
Patient temperature: 96
Potassium: 4.9 mmol/L (ref 3.5–5.1)
Sodium: 141 mmol/L (ref 135–145)
TCO2: 29 mmol/L (ref 22–32)
pCO2 arterial: 32.7 mmHg (ref 32.0–48.0)
pH, Arterial: 7.533 — ABNORMAL HIGH (ref 7.350–7.450)
pO2, Arterial: 228 mmHg — ABNORMAL HIGH (ref 83.0–108.0)

## 2019-07-11 LAB — PROTIME-INR
INR: 1 (ref 0.8–1.2)
Prothrombin Time: 13.2 seconds (ref 11.4–15.2)

## 2019-07-11 LAB — CBC
HCT: 33 % — ABNORMAL LOW (ref 39.0–52.0)
Hemoglobin: 10.7 g/dL — ABNORMAL LOW (ref 13.0–17.0)
MCH: 32.5 pg (ref 26.0–34.0)
MCHC: 32.4 g/dL (ref 30.0–36.0)
MCV: 100.3 fL — ABNORMAL HIGH (ref 80.0–100.0)
Platelets: 192 10*3/uL (ref 150–400)
RBC: 3.29 MIL/uL — ABNORMAL LOW (ref 4.22–5.81)
RDW: 13.2 % (ref 11.5–15.5)
WBC: 7.8 10*3/uL (ref 4.0–10.5)
nRBC: 0 % (ref 0.0–0.2)

## 2019-07-11 LAB — RESPIRATORY PANEL BY RT PCR (FLU A&B, COVID)
Influenza A by PCR: NEGATIVE
Influenza B by PCR: NEGATIVE
SARS Coronavirus 2 by RT PCR: NEGATIVE

## 2019-07-11 LAB — I-STAT CHEM 8, ED
BUN: 22 mg/dL (ref 8–23)
Calcium, Ion: 1.17 mmol/L (ref 1.15–1.40)
Chloride: 106 mmol/L (ref 98–111)
Creatinine, Ser: 2.2 mg/dL — ABNORMAL HIGH (ref 0.61–1.24)
Glucose, Bld: 88 mg/dL (ref 70–99)
HCT: 32 % — ABNORMAL LOW (ref 39.0–52.0)
Hemoglobin: 10.9 g/dL — ABNORMAL LOW (ref 13.0–17.0)
Potassium: 4.7 mmol/L (ref 3.5–5.1)
Sodium: 142 mmol/L (ref 135–145)
TCO2: 28 mmol/L (ref 22–32)

## 2019-07-11 LAB — TRIGLYCERIDES: Triglycerides: 134 mg/dL (ref <150)

## 2019-07-11 LAB — ETHANOL: Alcohol, Ethyl (B): <10 mg/dL (ref <10)

## 2019-07-11 LAB — APTT: aPTT: 26 seconds (ref 24–36)

## 2019-07-11 LAB — SODIUM: Sodium: 140 mmol/L (ref 135–145)

## 2019-07-11 LAB — CBG MONITORING, ED: Glucose-Capillary: 83 mg/dL (ref 70–99)

## 2019-07-11 MED ORDER — PROPOFOL 1000 MG/100ML IV EMUL
5.0000 ug/kg/min | INTRAVENOUS | Status: DC
  Administered 2019-07-12: 25 ug/kg/min via INTRAVENOUS
  Administered 2019-07-12: 45 ug/kg/min via INTRAVENOUS
  Administered 2019-07-12: 23:00:00 20 ug/kg/min via INTRAVENOUS
  Administered 2019-07-14: 12:00:00 7 ug/kg/min via INTRAVENOUS
  Filled 2019-07-11 (×5): qty 100

## 2019-07-11 MED ORDER — CHLORHEXIDINE GLUCONATE 0.12% ORAL RINSE (MEDLINE KIT)
15.0000 mL | Freq: Two times a day (BID) | OROMUCOSAL | Status: DC
  Administered 2019-07-11 – 2019-07-16 (×10): 15 mL via OROMUCOSAL

## 2019-07-11 MED ORDER — CLEVIDIPINE BUTYRATE 0.5 MG/ML IV EMUL
0.0000 mg/h | INTRAVENOUS | Status: DC
  Administered 2019-07-12: 23:00:00 7 mg/h via INTRAVENOUS
  Administered 2019-07-12: 09:00:00 10 mg/h via INTRAVENOUS
  Administered 2019-07-12: 7 mg/h via INTRAVENOUS
  Administered 2019-07-12: 05:00:00 4 mg/h via INTRAVENOUS
  Administered 2019-07-12 (×2): 8 mg/h via INTRAVENOUS
  Administered 2019-07-12: 16:00:00 6 mg/h via INTRAVENOUS
  Administered 2019-07-13: 7 mg/h via INTRAVENOUS
  Administered 2019-07-13: 10 mg/h via INTRAVENOUS
  Administered 2019-07-13: 7 mg/h via INTRAVENOUS
  Administered 2019-07-13: 16:00:00 10 mg/h via INTRAVENOUS
  Administered 2019-07-13: 11:00:00 15 mg/h via INTRAVENOUS
  Administered 2019-07-13: 21:00:00 20 mg/h via INTRAVENOUS
  Administered 2019-07-14: 12:00:00 12 mg/h via INTRAVENOUS
  Administered 2019-07-14: 05:00:00 20 mg/h via INTRAVENOUS
  Administered 2019-07-14: 08:00:00 18 mg/h via INTRAVENOUS
  Filled 2019-07-11 (×5): qty 50
  Filled 2019-07-11: qty 100
  Filled 2019-07-11 (×4): qty 50
  Filled 2019-07-11: qty 100
  Filled 2019-07-11 (×3): qty 50
  Filled 2019-07-11: qty 100
  Filled 2019-07-11: qty 50

## 2019-07-11 MED ORDER — ACETAMINOPHEN 160 MG/5ML PO SOLN
650.0000 mg | ORAL | Status: DC | PRN
  Administered 2019-07-13 – 2019-07-15 (×5): 650 mg
  Filled 2019-07-11 (×5): qty 20.3

## 2019-07-11 MED ORDER — SENNOSIDES-DOCUSATE SODIUM 8.6-50 MG PO TABS
1.0000 | ORAL_TABLET | Freq: Two times a day (BID) | ORAL | Status: DC
  Filled 2019-07-11: qty 1

## 2019-07-11 MED ORDER — STROKE: EARLY STAGES OF RECOVERY BOOK
Freq: Once | Status: AC
  Administered 2019-07-15: 02:00:00 1
  Filled 2019-07-11: qty 1

## 2019-07-11 MED ORDER — PANTOPRAZOLE SODIUM 40 MG IV SOLR
40.0000 mg | Freq: Every day | INTRAVENOUS | Status: DC
  Administered 2019-07-12: 40 mg via INTRAVENOUS
  Filled 2019-07-11: qty 40

## 2019-07-11 MED ORDER — ETOMIDATE 2 MG/ML IV SOLN
INTRAVENOUS | Status: AC | PRN
  Administered 2019-07-11: 15 mg via INTRAVENOUS

## 2019-07-11 MED ORDER — SUCCINYLCHOLINE CHLORIDE 20 MG/ML IJ SOLN
INTRAMUSCULAR | Status: AC | PRN
  Administered 2019-07-11: 80 mg via INTRAVENOUS

## 2019-07-11 MED ORDER — LABETALOL HCL 5 MG/ML IV SOLN
INTRAVENOUS | Status: AC | PRN
  Administered 2019-07-11: 10 mg via INTRAVENOUS

## 2019-07-11 MED ORDER — CHLORHEXIDINE GLUCONATE CLOTH 2 % EX PADS: 6.0000 | MEDICATED_PAD | Freq: Every day | CUTANEOUS | Status: DC

## 2019-07-11 MED ORDER — CLEVIDIPINE BUTYRATE 0.5 MG/ML IV EMUL
INTRAVENOUS | Status: AC
  Filled 2019-07-11: qty 50

## 2019-07-11 MED ORDER — ACETAMINOPHEN 325 MG PO TABS: 650.0000 mg | ORAL_TABLET | ORAL | Status: DC | PRN

## 2019-07-11 MED ORDER — CLEVIDIPINE BUTYRATE 0.5 MG/ML IV EMUL
INTRAVENOUS | Status: AC | PRN
  Administered 2019-07-11: 2 mg/h via INTRAVENOUS

## 2019-07-11 MED ORDER — ORAL CARE MOUTH RINSE
15.0000 mL | OROMUCOSAL | Status: DC
  Administered 2019-07-12 – 2019-07-16 (×45): 15 mL via OROMUCOSAL

## 2019-07-11 MED ORDER — PROPOFOL 1000 MG/100ML IV EMUL: 5.0000 ug/kg/min | INTRAVENOUS | Status: DC

## 2019-07-11 MED ORDER — SODIUM CHLORIDE 3 % IV SOLN
INTRAVENOUS | Status: DC
  Administered 2019-07-11 – 2019-07-13 (×5): 75 mL/h via INTRAVENOUS
  Filled 2019-07-11 (×8): qty 500

## 2019-07-11 MED ORDER — ACETAMINOPHEN 650 MG RE SUPP: 650.0000 mg | RECTAL | Status: DC | PRN

## 2019-07-11 MED ORDER — PROPOFOL 10 MG/ML IV BOLUS: 30.0000 mg | Freq: Once | INTRAVENOUS | Status: DC

## 2019-07-11 MED ORDER — PROPOFOL 1000 MG/100ML IV EMUL
INTRAVENOUS | Status: AC
  Filled 2019-07-11: qty 100
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
## 2019-07-11 NOTE — ED Notes (Signed)
Cleviprex reduced to 8 mg/hr due to intubation

## 2019-07-11 NOTE — Progress Notes (Signed)
eLink Physician-Brief Progress Note Patient Name: Jared Woodward DOB: 12/04/1940 MRN: [id number]   Date of Service  07/19/2019  HPI/Events of Note  Pt admitted with acute respiratory failure on the ventilator secondary to a large left parietal intracranial hemorrhage. Pt needs a propofol order.  eICU Interventions  New Patient Evaluation completed, Propofol ordered, PCCM bedside  Crew asked to see patient in consultation.        Kerry Kass Jared Woodward 07/16/2019, 10:53 PM

## 2019-07-11 NOTE — ED Provider Notes (Signed)
[facility] EMERGENCY DEPARTMENT Provider Note   CSN: [id number] Arrival date & time: 07/02/2019  1949     History Chief Complaint  Patient presents with  . Altered Mental Status  . Cerebrovascular Accident    Jared Woodward is a 79 y.o. male.  Pt presents to the ED today with AMS.  Pt was LSN 2 hrs pta.  Pt ate dinner and then went to the bathroom.  He was found by the family unresponsive and EMS was called.  When EMS arrived, he had agonal breathing and required bagging.  He was also bradycardic down to the 30s and required pacing.  Pt became uncomfortable with pacing, so he was given versed by EMS.  Pacing stopped as HR improved.  Pt is not showing any movement to his right side.  Pt unable to give any hx.  Code stroke not called by EMS as the picture was unclear due to the low HR and agonal breathing.  Code stroke called immediately upon arrival.        Past Medical History:  Diagnosis Date  . AMAUROSIS FUGAX 12/24/2007  . ANEMIA OF CHRONIC DISEASE 05/08/2006  . ANEMIA, VITAMIN B12 DEFICIENCY NEC 01/12/2007  . GLAUCOMA 05/08/2006  . HYPERTENSION 05/08/2006  . MENTAL RETARDATION 05/08/2006  . 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 PULMONARY NODULE 10/15/2007  . RENAL INSUFFICIENCY 05/08/2006    Patient Active Problem List   Diagnosis Date Noted  . ICH (intracerebral hemorrhage) ([place]) 07/22/2019  . Vitamin B12 deficiency 02/24/2018  . Fall 01/06/2018  . Tenderness over spine 01/06/2018  . HLD (hyperlipidemia) 08/17/2017  . Asthma 04/14/2016  . Nocturnal leg cramps 10/20/2015  . Allergic rhinitis 10/20/2015  . Healthcare maintenance 01/22/2015  . Osteoarthritis of both knees 09/17/2013  . Vitamin D deficiency 09/27/2012  . Benign essential tremor 08/31/2012  . Carpal tunnel syndrome 08/31/2012  . AMAUROSIS FUGAX 12/24/2007  . 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 Pulmonary nodule 10/15/2007  . Anemia of chronic disease 05/08/2006  .  Intellectual disability 05/08/2006  . GLAUCOMA 05/08/2006  . Essential hypertension, benign 05/08/2006  . Chronic kidney disease (CKD), stage IV (severe) ([place]) 05/08/2006    Past Surgical History:  Procedure Laterality Date  . TONSILLECTOMY  1956       Family History  Problem Relation Age of Onset  . Hypertension Brother   . Multiple myeloma Cousin   . Colon cancer Neg Hx   . Colon polyps Neg Hx   . Stomach cancer Neg Hx   . Rectal cancer Neg Hx     Social History   Tobacco Use  . Smoking status: Former Smoker    Packs/day: 0.10    Years: 5.00    Pack years: 0.50    Types: Cigarettes    Quit date: 05/03/1988    Years since quitting: 31.2  . Smokeless tobacco: Never Used  Substance Use Topics  . Alcohol use: No    Alcohol/week: 0.0 standard drinks  . Drug use: No    Home Medications Prior to Admission medications   Medication Sig Start Date End Date Taking? Authorizing Provider  amLODipine (NORVASC) 5 MG tablet Take 5 mg by mouth daily.   Yes [provider]  aspirin 81 MG tablet Take 81 mg by mouth daily.   Yes [provider]  brimonidine (ALPHAGAN) 0.2 % ophthalmic solution Place 1 drop into both eyes in the morning and at bedtime.  02/07/18  Yes [provider]  cetirizine (ZYRTEC) 10 MG  tablet Take 1/2 tablet daily Patient taking differently: Take 5 mg by mouth daily.  07/05/18  Yes [name], MD  Cholecalciferol (VITAMIN D3) 1000 units CAPS Take 1 capsule by mouth daily.    Yes [provider]  Cyanocobalamin (VITAMIN B12) 500 MCG TABS Take 500 mcg by mouth See admin instructions. Take one tablet every other day Patient taking differently: Take 500 mcg by mouth every other day.  10/04/18 10/04/19 Yes [name], MD  dorzolamide-timolol (COSOPT) 22.3-6.8 MG/ML ophthalmic solution Place 1 drop into both eyes 2 (two) times daily.  06/13/19  Yes [provider]  primidone (MYSOLINE) 50 MG tablet Take 1 tablet (50  mg total) by mouth 2 (two) times daily. 06/14/19  Yes Tat, Eustace Quail, DO  propranolol (INDERAL) 20 MG tablet Take 1 tablet (20 mg total) by mouth 2 (two) times daily. 07/05/18  Yes [name], MD  diclofenac sodium (VOLTAREN) 1 % GEL Apply 4 g topically 4 (four) times daily. Patient not taking: Reported on 06/27/2019 07/05/18   [name], MD  fluticasone [facility]) 50 MCG/ACT nasal spray Place 2 sprays into both nostrils daily. 07/05/18 07/05/19  [name], MD  Travoprost, BAK Free, (TRAVATAN Z) 0.004 % SOLN ophthalmic solution Place 1 drop into both eyes at bedtime. Patient not taking: Reported on 06/29/2019 10/18/15   Juliet Rude, MD    Allergies    Lisinopril  Review of Systems   Review of Systems  Unable to perform ROS: Patient unresponsive    Physical Exam Updated Vital Signs BP 107/67   Pulse 61   Temp (!) 96.2 F (35.7 C) (Axillary)   Resp 12   SpO2 100%   Physical Exam Vitals and nursing note reviewed.  Constitutional:      General: He is in acute distress.  HENT:     Head: Atraumatic.     Right Ear: External ear normal.     Left Ear: External ear normal.     Nose: Nose normal.     Mouth/Throat:     Mouth: Mucous membranes are moist.     Pharynx: Oropharynx is clear.  Eyes:     Extraocular Movements: Extraocular movements intact.     Conjunctiva/sclera: Conjunctivae normal.     Pupils: Pupils are equal, round, and reactive to light.  Cardiovascular:     Rate and Rhythm: Normal rate and regular rhythm.     Pulses: Normal pulses.     Heart sounds: Normal heart sounds.  Pulmonary:     Effort: Pulmonary effort is normal.     Breath sounds: Normal breath sounds.  Abdominal:     General: Abdomen is flat. Bowel sounds are normal.     Palpations: Abdomen is soft.  Musculoskeletal:     Cervical back: Normal range of motion and neck supple.  Skin:    General: Skin is warm.     Capillary Refill: Capillary refill takes less than 2 seconds.    Neurological:     Mental Status: He is alert.     Comments: Flaccid on right Aphasic Right facial droop     ED Results / Procedures / Treatments   Labs (all labs ordered are listed, but only abnormal results are displayed) Labs Reviewed  CBC - Abnormal; Notable for the following components:      Result Value   RBC 3.29 (*)    Hemoglobin 10.7 (*)    HCT 33.0 (*)    MCV 100.3 (*)    All other  components within normal limits  COMPREHENSIVE METABOLIC PANEL - Abnormal; Notable for the following components:   Creatinine, Ser 2.18 (*)    Albumin 3.3 (*)    GFR calc non Af Amer 28 (*)    GFR calc Af Amer 33 (*)    All other components within normal limits  I-STAT CHEM 8, ED - Abnormal; Notable for the following components:   Creatinine, Ser 2.20 (*)    Hemoglobin 10.9 (*)    HCT 32.0 (*)    All other components within normal limits  POCT I-STAT 7, (LYTES, BLD GAS, ICA,H+H) - Abnormal; Notable for the following components:   pH, Arterial 7.533 (*)    pO2, Arterial 228.0 (*)    Acid-Base Excess 5.0 (*)    HCT 31.0 (*)    Hemoglobin 10.5 (*)    All other components within normal limits  RESPIRATORY PANEL BY RT PCR (FLU A&B, COVID)  ETHANOL  PROTIME-INR  APTT  DIFFERENTIAL  RAPID URINE DRUG SCREEN, HOSP PERFORMED  URINALYSIS, ROUTINE W REFLEX MICROSCOPIC  CBG MONITORING, ED    EKG EKG Interpretation  Date/Time:  Monday July 11 2019 19:56:25 EDT Ventricular Rate:  57 PR Interval:    QRS Duration: 103 QT Interval:  454 QTC Calculation: 443 R Axis:   32 Text Interpretation: Sinus rhythm Prolonged PR interval Left atrial enlargement RSR' in V1 or V2, probably normal variant No significant change since last tracing Confirmed by Isla Pence [phone]) on 06/27/2019 8:58:14 PM   Radiology DG Chest Portable 1 View  Result Date: 07/26/2019 CLINICAL DATA:  Altered mental status. EXAM: PORTABLE CHEST 1 VIEW COMPARISON:  October 23, 2018 FINDINGS: Normal cardiomediastinal  silhouette. An endotracheal tube tip projects 5.1 cm above the carina. An enteric tube projects on the left upper abdomen in the expected location of the gastric lumen. Chronic mild hyperinflation with shallow lung inflation. A complex thick walled peripherally calcified pneumatocele is redemonstrated in the left upper lobe with similar radiographic appearance compared to June 2020. Probable bilateral nipple shadows project on the fifth anterior ribs; no pulmonary nodularity was present in this region on the remote April 25, 2016 CT chest examination. Aortic knob calcified atherosclerosis and ectasia. Telemetry leads. No pneumonia or pulmonary edema. No obvious pleural effusion or pneumothorax. No free subdiaphragmatic air. IMPRESSION: Support device placement; a 1.6 cm advancement of the endotracheal tube could be considered. 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 Once the patient has recovered from his current acute illness, outpatient CT chest without intravenous contrast recommended to better assess the stability of the pneumatocele compared to 2017. Probable bilateral nipple shadows. No pneumonia or obvious pleural effusion or pulmonary edema. Thoracic aortic calcified atherosclerosis and ectasia. Electronically Signed   By: Revonda Humphrey   On: 07/16/2019 20:54   CT HEAD CODE STROKE WO CONTRAST  Addendum Date: 07/04/2019   ADDENDUM REPORT: 07/21/2019 20:30 ADDENDUM: These results were called by telephone at the time of interpretation on 07/01/2019 at 8:30 pm to provider Dr. Lorraine Lax, who verbally acknowledged these results. Electronically Signed   By: Kellie Simmering DO   On: 07/25/2019 20:30   Result Date: 07/25/2019 CLINICAL DATA:  Code stroke. Neuro deficit, acute, stroke suspected. EXAM: CT HEAD WITHOUT CONTRAST TECHNIQUE: Contiguous axial images were obtained from the base of the skull  through the vertex without intravenous contrast. COMPARISON:  No pertinent prior studies available for comparison. FINDINGS: Brain: There is  a large acute parenchymal hemorrhage with prominent surrounding edema centered within the superior left temporal lobe and left temporoparietal junction. The dominant portion of the hemorrhage measures 7.4 x 4.5 x 4.6 cm (AP x TV x CC). There is also small volume intraventricular extension into the left lateral ventricle (series 5, image 37). There is significant mass effect with partial effacement of the left lateral and third ventricles. 11 mm rightward midline shift. No definite effacement of the basal cisterns on the current study. There is also small volume curvilinear hyperdensity along the inferior aspect of the hemorrhage, which may reflect subarachnoid extension. Vascular: No definite hyperdense vessel. Skull: Normal. Negative for fracture or focal lesion. Sinuses/Orbits: Visualized orbits demonstrate no acute abnormality. No significant paranasal sinus disease or mastoid effusion at the imaged levels. IMPRESSION: Large 7.4 x 4.5 x 4.6 cm acute parenchymal hemorrhage with prominent surrounding edema centered within the superior left temporal lobe and left temporoparietal junction. Marked mass effect with partial effacement of the left lateral and third ventricles. 11 mm rightward midline shift. Small volume intraventricular extension into the left lateral ventricle. There is also probable small volume subarachnoid extension of hemorrhage along the inferior aspect of the hematoma. Electronically Signed: By: Kellie Simmering DO On: 07/08/2019 20:21    Procedures Procedure Name: Intubation Date/Time: 07/05/2019 9:09 PM Performed by: Isla Pence, MD Pre-anesthesia Checklist: Patient identified, Patient being monitored, Emergency Drugs available, Timeout performed and Suction available Oxygen Delivery Method: Non-rebreather mask Preoxygenation: Pre-oxygenation with  100% oxygen Induction Type: Rapid sequence Ventilation: Mask ventilation without difficulty Laryngoscope Size: Glidescope and 3 Number of attempts: 1 Placement Confirmation: ETT inserted through vocal cords under direct vision,  CO2 detector and Breath sounds checked- equal and bilateral Secured at: 22 cm Tube secured with: ETT holder Dental Injury: Teeth and Oropharynx as per pre-operative assessment       (including critical care time)  Medications Ordered in ED Medications  clevidipine (CLEVIPREX) 0.5 MG/ML infusion (has no administration in time range)   stroke: mapping our early stages of recovery book (0 each Does not apply Hold 07/20/2019 2120)  acetaminophen (TYLENOL) tablet 650 mg (has no administration in time range)    Or  acetaminophen (TYLENOL) 160 MG/5ML solution 650 mg (has no administration in time range)    Or  acetaminophen (TYLENOL) suppository 650 mg (has no administration in time range)  senna-docusate (Senokot-S) tablet 1 tablet (has no administration in time range)  pantoprazole (PROTONIX) injection 40 mg (has no administration in time range)  labetalol (NORMODYNE) injection (10 mg Intravenous Given 07/05/2019 2010)  clevidipine (CLEVIPREX) infusion 0.5 mg/mL (2 mg/hr Intravenous Rate/Dose Change 07/25/2019 2102)  etomidate (AMIDATE) injection (15 mg Intravenous Given 07/10/2019 2028)  succinylcholine (ANECTINE) injection (80 mg Intravenous Given 07/21/2019 2029)  propofol (DIPRIVAN) 1000 MG/100ML infusion (35 mcg/kg/min  Rate/Dose Change 07/09/2019 2051)    ED Course  I have reviewed the triage vital signs and the nursing notes.  Pertinent labs & imaging results that were available during my care of the patient were reviewed by me and considered in my medical decision making (see chart for details).    MDM Rules/Calculators/A&P                      Unfortunately, pt has a large ICH.  He is hypertensive, so was started on Cleviprex.   Pt was not protecting his airway,  so he was intubated.  CXR shows tube needs to be advanced, so I asked RT to  advance tube which they did.  Pt's cousin called and updated.  Dr. Lorraine Lax will admit.  CRITICAL CARE Performed by: Isla Pence   Total critical care time: 30 minutes  Critical care time was exclusive of separately billable procedures and treating other patients.  Critical care was necessary to treat or prevent imminent or life-threatening deterioration.  Critical care was time spent personally by me on the following activities: development of treatment plan with patient and/or surrogate as well as nursing, discussions with consultants, evaluation of patient's response to treatment, examination of patient, obtaining history from patient or surrogate, ordering and performing treatments and interventions, ordering and review of laboratory studies, ordering and review of radiographic studies, pulse oximetry and re-evaluation of patient's condition.   Final Clinical Impression(s) / ED Diagnoses Final diagnoses:  Nontraumatic cortical hemorrhage of left cerebral hemisphere [facility])  Acute respiratory failure, unspecified whether with hypoxia or hypercapnia [facility])  Hypertensive emergency    Rx / [place] Orders ED Discharge Orders    None       Isla Pence, MD 07/07/2019 2125

## 2019-07-11 NOTE — H&P (Signed)
Chief Complaint: Unresponsive  History obtained from: Patient and Chart    HPI:                                                                                                                                       Jared Woodward is a 79 y.o. male with past medical history significant for hypertension,MGUS, essential tremor, mental retardation presents to the emergency department after being found unresponsive by family.    Last known normal was 1750.  Patient had agonal breathing in route and was bagged and paced.  Patient responded to pacing and was sedated with Versed.  Blood pressure was greater than 352 systolic.  On arrival to [facility] ER, patient awake but nonverbal.  Right-sided was plegic.  Stat CT head was obtained which showed a large left temporoparietal hemorrhage measuring approximately 50 cc with mild intraventricular hemorrhage.  11 mm midline shift with partial effacement of left lateral and third ventricles.  Patient was intubated for airway protection.  Date last known well: 3.15.21 Time last known well: 1750 tPA Given: No, hemorrhage NIHSS: 36 Baseline MRS 2  Intracerebral Hemorrhage (ICH) Score  Glascow Coma Score  5-12 +1  Age >/= 80 no 0  ICH volume >/= 29m  yes +1  IVH yes no 1  Infratentorial origin no 0 Total:  2   Past Medical History:  Diagnosis Date  . AMAUROSIS FUGAX 12/24/2007  . ANEMIA OF CHRONIC DISEASE 05/08/2006  . ANEMIA, VITAMIN B12 DEFICIENCY NEC 01/12/2007  . GLAUCOMA 05/08/2006  . HYPERTENSION 05/08/2006  . MENTAL RETARDATION 05/08/2006  . [place]  . PULMONARY NODULE 10/15/2007  . RENAL INSUFFICIENCY 05/08/2006    Past Surgical History:  Procedure Laterality Date  . TONSILLECTOMY  1956    Family History  Problem Relation Age of Onset  . Hypertension Brother   . Multiple myeloma Cousin   . Colon cancer Neg Hx   . Colon polyps Neg Hx   . Stomach cancer Neg Hx   . Rectal cancer Neg Hx    Social  History:  reports that he quit smoking about 31 years ago. His smoking use included cigarettes. He has a 0.50 pack-year smoking history. He has never used smokeless tobacco. He reports that he does not drink alcohol or use drugs.  Allergies:  Allergies  Allergen Reactions  . Lisinopril Cough    Medications:  I reviewed home medications   ROS:                                                                                                                                     Unable to review systems due to patient's mental status   Examination:                                                                                                      General: Appears well-developed  Psych: Affect appropriate to situation Eyes: No scleral injection HENT: No OP obstrucion Head: Normocephalic.  Cardiovascular: Normal rate and regular rhythm. Respiratory: Effort normal and breath sounds normal to anterior ascultation GI: Soft.  No distension. There is no tenderness.  Skin: WDI    Neurological Examination Mental Status: Somnolent but arousable, does not follow any commands.  Does not track examiner.  Nonverbal Cranial Nerves: II: Pupils are 2 mm bilaterally, sluggish III,IV, VI: no forced gaze deviation, crosses midline V,VII: Right facial droop XII: midline tongue extension Motor: Right : Upper extremity   0/5    Left:     Upper extremity   3/5  Lower extremity   0/5     Lower extremity   3/5 Tone and bulk:normal tone throughout; no atrophy noted Sensory: Does not respond to noxious stimulus on right side Plantars: Right: downgoing   Left: downgoing Cerebellar: No gross ataxia noted on the left side      Lab Results: Basic Metabolic Panel: Recent Labs  Lab 06/30/2019 1957 07/09/2019 2005 07/18/2019 2107  NA 141 142 141  K 5.0 4.7 4.9  CL 104 106  --   CO2 27  --    --   GLUCOSE 96 88  --   BUN 20 22  --   CREATININE 2.18* 2.20*  --   CALCIUM 9.1  --   --     CBC: Recent Labs  Lab 07/26/2019 1957 06/30/2019 2005 07/18/2019 2107  WBC 7.8  --   --   NEUTROABS 5.1  --   --   HGB 10.7* 10.9* 10.5*  HCT 33.0* 32.0* 31.0*  MCV 100.3*  --   --   PLT 192  --   --     Coagulation Studies: Recent Labs    07/22/2019 1957  LABPROT 13.2  INR 1.0    Imaging: DG Chest Portable 1 View  Result Date: 07/14/2019 CLINICAL DATA:  Altered mental status. EXAM: PORTABLE CHEST 1 VIEW COMPARISON:  October 23, 2018 FINDINGS: Normal cardiomediastinal silhouette. An endotracheal tube tip projects 5.1 cm above the carina. An enteric tube projects on the left upper abdomen in the expected location of the gastric lumen. Chronic mild hyperinflation with shallow lung inflation. A complex thick walled peripherally calcified pneumatocele is redemonstrated in the left upper lobe with similar radiographic appearance compared to June 2020. Probable bilateral nipple shadows project on the fifth anterior ribs; no pulmonary nodularity was present in this region on the remote April 25, 2016 CT chest examination. Aortic knob calcified atherosclerosis and ectasia. Telemetry leads. No pneumonia or pulmonary edema. No obvious pleural effusion or pneumothorax. No free subdiaphragmatic air. IMPRESSION: Support device placement; a 1.6 cm advancement of the endotracheal tube could be considered. 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 Once the patient has recovered from his current acute illness, outpatient CT chest without intravenous contrast recommended to better assess the stability of the pneumatocele compared to 2017. Probable bilateral nipple shadows. No pneumonia or obvious pleural effusion or pulmonary edema. Thoracic aortic calcified atherosclerosis and ectasia.  Electronically Signed   By: Revonda Humphrey   On: 06/27/2019 20:54   CT HEAD CODE STROKE WO CONTRAST  Addendum Date: 06/27/2019   ADDENDUM REPORT: 07/10/2019 20:30 ADDENDUM: These results were called by telephone at the time of interpretation on 07/08/2019 at 8:30 pm to provider Dr. Lorraine Lax, who verbally acknowledged these results. Electronically Signed   By: Kellie Simmering DO   On: 07/27/2019 20:30   Result Date: 07/22/2019 CLINICAL DATA:  Code stroke. Neuro deficit, acute, stroke suspected. EXAM: CT HEAD WITHOUT CONTRAST TECHNIQUE: Contiguous axial images were obtained from the base of the skull through the vertex without intravenous contrast. COMPARISON:  No pertinent prior studies available for comparison. FINDINGS: Brain: There is a large acute parenchymal hemorrhage with prominent surrounding edema centered within the superior left temporal lobe and left temporoparietal junction. The dominant portion of the hemorrhage measures 7.4 x 4.5 x 4.6 cm (AP x TV x CC). There is also small volume intraventricular extension into the left lateral ventricle (series 5, image 37). There is significant mass effect with partial effacement of the left lateral and third ventricles. 11 mm rightward midline shift. No definite effacement of the basal cisterns on the current study. There is also small volume curvilinear hyperdensity along the inferior aspect of the hemorrhage, which may reflect subarachnoid extension. Vascular: No definite hyperdense vessel. Skull: Normal. Negative for fracture or focal lesion. Sinuses/Orbits: Visualized orbits demonstrate no acute abnormality. No significant paranasal sinus disease or mastoid effusion at the imaged levels. IMPRESSION: Large 7.4 x 4.5 x 4.6 cm acute parenchymal hemorrhage with prominent surrounding edema centered within the superior left temporal lobe and left temporoparietal junction. Marked mass effect with partial effacement of the left lateral and third ventricles. 11 mm  rightward midline shift. Small volume intraventricular extension into the left lateral ventricle. There is also probable small volume subarachnoid extension of hemorrhage along the inferior aspect of the hematoma. Electronically Signed: By: Kellie Simmering DO On: 07/26/2019 20:21     ASSESSMENT AND PLAN   79 year old male with past medical history of hypertension, MGUS presents to the ED non verbal, right-sided hemiplegia secondary to large left temporoparietal hemorrhage.  Etiology of hemorrhage pending evaluation-possibly hypertensive however other conditions need to be ruled out.   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 Ronnald Ramp, felt that he is not a surgical candidate. -We will repeat CT head with CTA head in 6 hours to see if there is expansion of hemorrhage   Cytotoxic and vasogenic cerebral edema with 11 mm midline shift - will start hypertonic saline -Sodium goal of 1 50-1 55  Acute respiratory failure secondary to Glasgow -Appreciate ED  assistance for intubation and appreciate CCM for ventilator management   This patient is neurologically critically ill due to [place].   He is at risk for significant risk of neurological worsening from cerebral edema,  death from brain herniation, heart failure infection, respiratory failure and seizure. This patient's care requires constant monitoring of vital signs, hemodynamics, respiratory and cardiac monitoring, review of multiple databases, neurological assessment, discussion with family, other specialists and medical decision making of high complexity.  I spent 55 minutes of neurocritical time in the care of this patient.   Analysia Dungee [facility] Pager Number [phone]

## 2019-07-11 NOTE — Progress Notes (Signed)
[facility] Progress Note Patient Name: Jared Woodward DOB: 1940/08/01 MRN: [id number]   Date of Service  07/09/2019  HPI/Events of Note  Pt on Cleviprex  Which needs to be reordered with titratable parameters.  eICU Interventions  Cleviprex ordered.        Kerry Kass Kadi Hession 07/23/2019, 11:48 PM

## 2019-07-11 NOTE — ED Notes (Signed)
Maurine Minister, cousin, [phone] would like an update and to give info on the pt when available

## 2019-07-12 ENCOUNTER — Inpatient Hospital Stay (HOSPITAL_COMMUNITY): Payer: Medicare Other

## 2019-07-12 ENCOUNTER — Encounter (HOSPITAL_COMMUNITY): Payer: Self-pay | Admitting: Neurology

## 2019-07-12 ENCOUNTER — Inpatient Hospital Stay: Payer: Self-pay

## 2019-07-12 DIAGNOSIS — I619 Nontraumatic intracerebral hemorrhage, unspecified: Secondary | ICD-10-CM

## 2019-07-12 DIAGNOSIS — I351 Nonrheumatic aortic (valve) insufficiency: Secondary | ICD-10-CM

## 2019-07-12 DIAGNOSIS — I615 Nontraumatic intracerebral hemorrhage, intraventricular: Principal | ICD-10-CM

## 2019-07-12 DIAGNOSIS — G936 Cerebral edema: Secondary | ICD-10-CM

## 2019-07-12 DIAGNOSIS — R1312 Dysphagia, oropharyngeal phase: Secondary | ICD-10-CM

## 2019-07-12 DIAGNOSIS — I361 Nonrheumatic tricuspid (valve) insufficiency: Secondary | ICD-10-CM

## 2019-07-12 LAB — ECHOCARDIOGRAM COMPLETE: Weight: 1940.05 oz

## 2019-07-12 LAB — SODIUM
Sodium: 143 mmol/L (ref 135–145)
Sodium: 150 mmol/L — ABNORMAL HIGH (ref 135–145)

## 2019-07-12 LAB — POCT I-STAT 7, (LYTES, BLD GAS, ICA,H+H)
Acid-Base Excess: 1 mmol/L (ref 0.0–2.0)
Bicarbonate: 26.2 mmol/L (ref 20.0–28.0)
Calcium, Ion: 1.25 mmol/L (ref 1.15–1.40)
HCT: 29 % — ABNORMAL LOW (ref 39.0–52.0)
Hemoglobin: 9.9 g/dL — ABNORMAL LOW (ref 13.0–17.0)
O2 Saturation: 100 %
Patient temperature: 97.8
Potassium: 4.5 mmol/L (ref 3.5–5.1)
Sodium: 143 mmol/L (ref 135–145)
TCO2: 27 mmol/L (ref 22–32)
pCO2 arterial: 41.9 mmHg (ref 32.0–48.0)
pH, Arterial: 7.402 (ref 7.350–7.450)
pO2, Arterial: 178 mmHg — ABNORMAL HIGH (ref 83.0–108.0)

## 2019-07-12 LAB — BASIC METABOLIC PANEL
Anion gap: 14 (ref 5–15)
BUN: 20 mg/dL (ref 8–23)
CO2: 19 mmol/L — ABNORMAL LOW (ref 22–32)
Calcium: 8.7 mg/dL — ABNORMAL LOW (ref 8.9–10.3)
Chloride: 114 mmol/L — ABNORMAL HIGH (ref 98–111)
Creatinine, Ser: 1.9 mg/dL — ABNORMAL HIGH (ref 0.61–1.24)
GFR calc Af Amer: 39 mL/min — ABNORMAL LOW (ref >60)
GFR calc non Af Amer: 33 mL/min — ABNORMAL LOW (ref >60)
Glucose, Bld: 125 mg/dL — ABNORMAL HIGH (ref 70–99)
Potassium: 4.9 mmol/L (ref 3.5–5.1)
Sodium: 147 mmol/L — ABNORMAL HIGH (ref 135–145)

## 2019-07-12 LAB — URINALYSIS, ROUTINE W REFLEX MICROSCOPIC
Bilirubin Urine: NEGATIVE
Glucose, UA: NEGATIVE mg/dL
Hgb urine dipstick: NEGATIVE
Ketones, ur: 5 mg/dL — AB
Leukocytes,Ua: NEGATIVE
Nitrite: NEGATIVE
Protein, ur: NEGATIVE mg/dL
Specific Gravity, Urine: 1.014 (ref 1.005–1.030)
pH: 8 (ref 5.0–8.0)

## 2019-07-12 LAB — CBC
HCT: 31.5 % — ABNORMAL LOW (ref 39.0–52.0)
Hemoglobin: 10.5 g/dL — ABNORMAL LOW (ref 13.0–17.0)
MCH: 32.7 pg (ref 26.0–34.0)
MCHC: 33.3 g/dL (ref 30.0–36.0)
MCV: 98.1 fL (ref 80.0–100.0)
Platelets: 183 10*3/uL (ref 150–400)
RBC: 3.21 MIL/uL — ABNORMAL LOW (ref 4.22–5.81)
RDW: 13.4 % (ref 11.5–15.5)
WBC: 8.3 10*3/uL (ref 4.0–10.5)
nRBC: 0 % (ref 0.0–0.2)

## 2019-07-12 LAB — RAPID URINE DRUG SCREEN, HOSP PERFORMED
Amphetamines: NOT DETECTED
Barbiturates: POSITIVE — AB
Benzodiazepines: POSITIVE — AB
Cocaine: NOT DETECTED
Opiates: NOT DETECTED
Tetrahydrocannabinol: NOT DETECTED

## 2019-07-12 LAB — MRSA PCR SCREENING: MRSA by PCR: NEGATIVE

## 2019-07-12 MED ORDER — IOHEXOL 350 MG/ML SOLN
50.0000 mL | Freq: Once | INTRAVENOUS | Status: AC | PRN
  Administered 2019-07-12: 50 mL via INTRAVENOUS

## 2019-07-12 MED ORDER — CHLORHEXIDINE GLUCONATE CLOTH 2 % EX PADS
6.0000 | MEDICATED_PAD | Freq: Every day | CUTANEOUS | Status: DC
  Administered 2019-07-13 – 2019-07-15 (×3): 6 via TOPICAL

## 2019-07-12 MED ORDER — SODIUM CHLORIDE 0.9% FLUSH: 10.0000 mL | INTRAVENOUS | Status: DC | PRN

## 2019-07-12 MED ORDER — SENNOSIDES-DOCUSATE SODIUM 8.6-50 MG PO TABS
1.0000 | ORAL_TABLET | Freq: Two times a day (BID) | ORAL | Status: DC
  Administered 2019-07-12 – 2019-07-14 (×5): 1
  Filled 2019-07-12 (×5): qty 1

## 2019-07-12 NOTE — Progress Notes (Incomplete)
Pt belonging bags at bedside. One bag with clothing, one bag with shoes, glasses, single upper dentures, wallet with cards, and money pouch with several $1. ?

## 2019-07-12 NOTE — Consult Note (Signed)
Reason for Consult:ICH Referring Physician: neurology  Jared Woodward is an 79 y.o. male.   HPI:  Unfortunate 79 year old gentleman who was found unresponsive in the bathroom after dinner last night and brought to the emergency department where he was nonverbal and right hemiplegic.  CT scan of the head showed a large left parietal intracerebral hemorrhage with midline shift.  He was intubated and admitted to the ICU.  Neurology asked me if the patient was a surgical candidate.  The patient is intubated and sedated and unable to cooperate with history and physical.  He has a limited baseline functional status.  Past Medical History:  Diagnosis Date  . AMAUROSIS FUGAX 12/24/2007  . ANEMIA OF CHRONIC DISEASE 05/08/2006  . ANEMIA, VITAMIN B12 DEFICIENCY NEC 01/12/2007  . GLAUCOMA 05/08/2006  . HYPERTENSION 05/08/2006  . MENTAL RETARDATION 05/08/2006  . 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 PULMONARY NODULE 10/15/2007  . RENAL INSUFFICIENCY 05/08/2006    Past Surgical History:  Procedure Laterality Date  . TONSILLECTOMY  1956    Allergies  Allergen Reactions  . Lisinopril Cough    Social History   Tobacco Use  . Smoking status: Former Smoker    Packs/day: 0.10    Years: 5.00    Pack years: 0.50    Types: Cigarettes    Quit date: 05/03/1988    Years since quitting: 31.2  . Smokeless tobacco: Never Used  Substance Use Topics  . Alcohol use: No    Alcohol/week: 0.0 standard drinks    Family History  Problem Relation Age of Onset  . Hypertension Brother   . Multiple myeloma Cousin   . Colon cancer Neg Hx   . Colon polyps Neg Hx   . Stomach cancer Neg Hx   . Rectal cancer Neg Hx      Review of Systems  Positive ROS: Unable to obtain  All other systems have been reviewed and were otherwise negative with the exception of those mentioned in the HPI and as above.  Objective: Vital signs in last 24 hours: Temp:  [96.2 F (35.7 C)-97.8 F (36.6 C)] 97.8 F (36.6 C) (03/16  0400) Pulse Rate:  [25-80] 78 (03/16 0751) Resp:  [11-20] 12 (03/16 0751) BP: (96-188)/(45-114) 118/72 (03/16 0600) SpO2:  [84 %-100 %] 100 % (03/16 0752) FiO2 (%):  [40 %] 40 % (03/16 0752) Weight:  [55 kg] 55 kg (03/15 2331)  General Appearance: Intubated male, no distress, appears stated age Head: Normocephalic, without obvious abnormality, atraumatic Eyes: pupils small, difficult to see reaction Throat: Intubated Neck: Supple Lungs:  respirations unlabored Heart: Regular rate and rhythm Abdomen: Soft Extremities: Extremities normal, atraumatic, no cyanosis or edema Pulses: 2+ and symmetric all extremities Skin: Skin color, texture, turgor normal, no rashes or lesions  NEUROLOGIC:   Mental status: Intubated and sedated Motor Exam -flexes on the left, no movement on right Sensory Exam -unable to assess Reflexes:  Coordination -unable to assess Gait  -unable to assess l Balance -unable to assess Cranial Nerves: I: smell Not tested  II: visual acuity  OS: na    OD: na  II: visual fields   II: pupils   III,VII: ptosis   III,IV,VI: extraocular muscles    V: mastication   V: facial light touch sensation    V,VII: corneal reflex    VII: facial muscle function - upper    VII: facial muscle function - lower   VIII: hearing   IX: soft palate elevation    IX,X: gag reflex  present  XI: trapezius strength    XI: sternocleidomastoid strength   XI: neck flexion strength    XII: tongue strength      Data Review Lab Results  Component Value Date   WBC 7.8 07/03/2019   HGB 9.9 (L) 07/12/2019   HCT 29.0 (L) 07/12/2019   MCV 100.3 (H) 07/15/2019   PLT 192 07/09/2019   Lab Results  Component Value Date   NA 143 07/12/2019   K 4.5 07/12/2019   CL 106 07/06/2019   CO2 27 07/09/2019   BUN 22 06/28/2019   CREATININE 2.20 (H) 07/23/2019   GLUCOSE 88 07/13/2019   Lab Results  Component Value Date   INR 1.0 07/10/2019    Radiology: CT ANGIO HEAD W OR WO  CONTRAST  Result Date: 07/12/2019 CLINICAL DATA:  Intracranial hemorrhage. EXAM: CT ANGIOGRAPHY HEAD TECHNIQUE: Multidetector CT imaging of the head was performed using the standard protocol during bolus administration of intravenous contrast. Multiplanar CT image reconstructions and MIPs were obtained to evaluate the vascular anatomy. CONTRAST:  41m OMNIPAQUE IOHEXOL 350 MG/ML SOLN COMPARISON:  None. FINDINGS: Allowing for differences in slice selection, large intraparenchymal hematoma centered in the left parietal lobe is unchanged size. Surrounding edema has worsened slightly. At the level of the foramina of [place], rightward midline shift measures 11 mm, unchanged. There is persistent narrowing of the cerebral aqueduct. POSTERIOR CIRCULATION: --Vertebral arteries: Normal V4 segments. --Posterior inferior cerebellar arteries (PICA): Patent origins from the vertebral arteries. --Anterior inferior cerebellar arteries (AICA): Patent origins from the basilar artery. --Basilar artery: Normal. --Superior cerebellar arteries: Normal. --Posterior cerebral arteries: Normal. There are bilateral posterior communicating arteries (p-comm) that partially supply the PCAs. ANTERIOR CIRCULATION: --Intracranial internal carotid arteries: Normal. --Anterior cerebral arteries (ACA): Normal. Both A1 segments are present. Patent anterior communicating artery (a-comm). --Middle cerebral arteries (MCA): Left MCA branches are displaced anteriorly due to mass effect from the left parietal lobe hemorrhage. Venous sinuses: As permitted by contrast timing, patent. Anatomic variants: None IMPRESSION: 1. Unchanged size of large left parietal lobe intraparenchymal hematoma with slight worsening of surrounding edema and unchanged 11 mm rightward midline shift. 2. No aneurysm or vascular malformation. 3. Anterior displacement of the left MCA branches due to mass effect from the intraparenchymal hematoma. Electronically Signed   By: KUlyses JarredM.D.   On: 07/12/2019 01:09   DG Chest Portable 1 View  Result Date: 07/26/2019 CLINICAL DATA:  Altered mental status. EXAM: PORTABLE CHEST 1 VIEW COMPARISON:  October 23, 2018 FINDINGS: Normal cardiomediastinal silhouette. An endotracheal tube tip projects 5.1 cm above the carina. An enteric tube projects on the left upper abdomen in the expected location of the gastric lumen. Chronic mild hyperinflation with shallow lung inflation. A complex thick walled peripherally calcified pneumatocele is redemonstrated in the left upper lobe with similar radiographic appearance compared to June 2020. Probable bilateral nipple shadows project on the fifth anterior ribs; no pulmonary nodularity was present in this region on the remote April 25, 2016 CT chest examination. Aortic knob calcified atherosclerosis and ectasia. Telemetry leads. No pneumonia or pulmonary edema. No obvious pleural effusion or pneumothorax. No free subdiaphragmatic air. IMPRESSION: Support device placement; a 1.6 cm advancement of the endotracheal tube could be considered. 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 Once the patient has recovered from his current acute illness, outpatient CT chest without intravenous contrast recommended to better assess the stability of the pneumatocele compared  to 2017. Probable bilateral nipple shadows. No pneumonia or obvious pleural effusion or pulmonary edema. Thoracic aortic calcified atherosclerosis and ectasia. Electronically Signed   By: Revonda Humphrey   On: 07/05/2019 20:54   CT HEAD CODE STROKE WO CONTRAST  Addendum Date: 06/27/2019   ADDENDUM REPORT: 07/03/2019 20:30 ADDENDUM: These results were called by telephone at the time of interpretation on 07/10/2019 at 8:30 pm to provider Dr. Lorraine Lax, who verbally acknowledged these results. Electronically Signed   By: Kellie Simmering DO   On:  07/06/2019 20:30   Result Date: 07/13/2019 CLINICAL DATA:  Code stroke. Neuro deficit, acute, stroke suspected. EXAM: CT HEAD WITHOUT CONTRAST TECHNIQUE: Contiguous axial images were obtained from the base of the skull through the vertex without intravenous contrast. COMPARISON:  No pertinent prior studies available for comparison. FINDINGS: Brain: There is a large acute parenchymal hemorrhage with prominent surrounding edema centered within the superior left temporal lobe and left temporoparietal junction. The dominant portion of the hemorrhage measures 7.4 x 4.5 x 4.6 cm (AP x TV x CC). There is also small volume intraventricular extension into the left lateral ventricle (series 5, image 37). There is significant mass effect with partial effacement of the left lateral and third ventricles. 11 mm rightward midline shift. No definite effacement of the basal cisterns on the current study. There is also small volume curvilinear hyperdensity along the inferior aspect of the hemorrhage, which may reflect subarachnoid extension. Vascular: No definite hyperdense vessel. Skull: Normal. Negative for fracture or focal lesion. Sinuses/Orbits: Visualized orbits demonstrate no acute abnormality. No significant paranasal sinus disease or mastoid effusion at the imaged levels. IMPRESSION: Large 7.4 x 4.5 x 4.6 cm acute parenchymal hemorrhage with prominent surrounding edema centered within the superior left temporal lobe and left temporoparietal junction. Marked mass effect with partial effacement of the left lateral and third ventricles. 11 mm rightward midline shift. Small volume intraventricular extension into the left lateral ventricle. There is also probable small volume subarachnoid extension of hemorrhage along the inferior aspect of the hematoma. Electronically Signed: By: Kellie Simmering DO On: 07/22/2019 20:21     Assessment/Plan: Estimated body mass index is 18.44 kg/m as calculated from the following:   Height  as of 06/13/19: 5' 8" (1.727 m).   Weight as of this encounter: 55 kg.   Unfortunate 79 year old gentleman with a large dominant hemisphere hemorrhage who presented with aphasia and hemiplegia.  I do not believe this is a clinical scenario that is amenable to surgical intervention, nor do I think surgical intervention should be performed as it would likely be futile in regards to functional status.  Should he survive this hemorrhage, he would likely be aphasic and hemiplegic.  That simply is not quality of life in my opinion.  Instead, I would have a long discussion with the family regarding goals of care.  I suspect, given his premorbid status, they would not want such aggressive treatment to achieve the likely functional outcome.  I tried to call the relative listed in the chart and got no answer.   Eustace Moore 07/12/2019 8:08 AM

## 2019-07-12 NOTE — Consult Note (Signed)
NAME:  Jared Woodward, MRN:  [id number], DOB:  12/25/1940, LOS: 1 ADMISSION DATE:  07/20/2019, CONSULTATION DATE:  07/23/2019 REFERRING MD: Aroor, CHIEF COMPLAINT:  AMS  Brief History    79 year old man with hx of HTN, anemia of chronic disease, HLD, cognitive impairment, CKD stage IV, found unresponsive by family in bathroom after dinner.    EMS called, bagged and cv pacing while en route to hospital (given versed by EMS).    Hypertensive on arrival [phone] systolic   Cleviprex started - BP controlled.  History of present illness   Arrival to ED, R sided hemiplegia.   Awake but non verbal on initial arrival (per neuro note) Intubated for airway protection.   Past Medical History  HTN Glaucoma Amaurosis fugax HLD,  ACD CKD IV Cognitive impairment MGUS  Essential tremor  Significant Hospital Events    Consults:  Neurology primary PCCM consult Procedures:    Significant Diagnostic Tests:  CXR 07/23/2019 Support device placement; a 1.6 cm advancement of the endotracheal tube could be considered. 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 Once the patient has recovered from his current acute illness, outpatient CT chest without intravenous contrast recommended to better assess the stability of the pneumatocele compared to 2017. Probable bilateral nipple shadows. No pneumonia or obvious pleural effusion or pulmonary edema. Thoracic aortic calcified atherosclerosis and ectasia.  CT angio 07/12/19:  1. Unchanged size of large left parietal lobe intraparenchymal hematoma with slight worsening of surrounding edema and unchanged 11 mm rightward midline shift. 2. No aneurysm or vascular malformation. 3. Anterior displacement of the left MCA branches due to mass effect from the intraparenchymal hematoma.  Micro Data:   Antimicrobials:    Interim  history/subjective:    Objective   Blood pressure 133/77, pulse 62, temperature (!) 97.4 F (36.3 C), temperature source Oral, resp. rate 12, weight 55 kg, SpO2 100 %.    Vent Mode: PRVC FiO2 (%):  [40 %] 40 % Set Rate:  [12 bmp-16 bmp] 12 bmp Vt Set:  [580 mL] 580 mL PEEP:  [5 cmH20] 5 cmH20 Plateau Pressure:  [17 cmH20-20 cmH20] 20 cmH20   Intake/Output Summary (Last 24 hours) at 07/12/2019 0215 Last data filed at 07/26/2019 2316 Gross per 24 hour  Intake 17.92 ml  Output --  Net 17.92 ml   Filed Weights   07/15/2019 2331  Weight: 55 kg    Examination: General: Intubated, moving LUE in purposeful appearing manner, mild tremor  HENT: pupils pinpoint Lungs: CTAB Cardiovascular: RRR no mgr  Abdomen: NT,ND, NBS Extremities: moving LUE, trying to pull at gown, minimal movement in response to pain from other extremities.  Neuro: not alert or oriented, not following commands  Resolved Hospital Problem list     Assessment & Plan:  Hemorrhagic stroke:  Goal SBP <140.   CT head follow up for eval for expansion.  Per neuro note not candidate for nsg.  Cytotoxic and vasogenic cerebral edema with midline shift: on hypertonic saline per neurology, goal NA >150.  Cont cleviprex for goal sbp.    Intubation for airway protection, agonal breathing:  Vent;  PRVC RR 12 TV 580, PEEP 5, FIO2 40%  Initial Ph 7. 533/32.7/228 -- RR decreased from 16 to 12 at that time.  Recheck ABG pending.  Best practice:  Diet: NPO  Pain/Anxiety/Delirium protocol (if indicated): on propofol  VAP protocol (if indicated): yes  DVT prophylaxis:  held, SCDs GI prophylaxis: protonix Glucose control:  Mobility: bed Code Status: Full  Family Communication:  Disposition:   Labs   CBC: Recent Labs  Lab 07/13/2019 1957 07/06/2019 2005 07/10/2019 2107  WBC 7.8  --   --   NEUTROABS 5.1  --   --   HGB 10.7* 10.9* 10.5*  HCT 33.0* 32.0* 31.0*  MCV 100.3*  --   --   PLT 192  --   --     Basic  Metabolic Panel: Recent Labs  Lab 07/02/2019 1957 07/21/2019 2005 07/07/2019 2107 07/05/2019 2259  NA 141 142 141 140  K 5.0 4.7 4.9  --   CL 104 106  --   --   CO2 27  --   --   --   GLUCOSE 96 88  --   --   BUN 20 22  --   --   CREATININE 2.18* 2.20*  --   --   CALCIUM 9.1  --   --   --    GFR: Estimated Creatinine Clearance: 22.2 mL/min (A) (by C-G formula based on SCr of 2.2 mg/dL (H)). Recent Labs  Lab 06/27/2019 1957  WBC 7.8    Liver Function Tests: Recent Labs  Lab 07/07/2019 1957  AST 21  ALT 21  ALKPHOS 80  BILITOT 0.4  PROT 6.7  ALBUMIN 3.3*   No results for input(s): LIPASE, AMYLASE in the last 168 hours. No results for input(s): AMMONIA in the last 168 hours.  ABG    Component Value Date/Time   PHART 7.533 (H) 07/08/2019 2107   PCO2ART 32.7 07/05/2019 2107   PO2ART 228.0 (H) 06/28/2019 2107   HCO3 27.8 07/02/2019 2107   TCO2 29 07/02/2019 2107   O2SAT 100.0 07/26/2019 2107     Coagulation Profile: Recent Labs  Lab 07/24/2019 1957  INR 1.0    Cardiac Enzymes: No results for input(s): CKTOTAL, CKMB, CKMBINDEX, TROPONINI in the last 168 hours.  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   Surgical History    Past Surgical History:  Procedure Laterality Date  . TONSILLECTOMY  1956     Social History   reports that he quit smoking about 31 years ago. His smoking use included cigarettes. He has a 0.50 pack-year smoking history. He has never used smokeless tobacco. He reports that he does not drink alcohol  or use drugs.   Family History   His family history includes Hypertension in his brother; Multiple myeloma in his cousin. There is no history of Colon cancer, Colon polyps, Stomach cancer, or Rectal cancer.   Allergies Allergies  Allergen Reactions  . Lisinopril Cough     Home Medications  Prior to Admission medications   Medication Sig Start Date End Date Taking? Authorizing Provider  amLODipine (NORVASC) 5 MG tablet Take 5 mg by mouth daily.   Yes [provider]  aspirin 81 MG tablet Take 81 mg by mouth daily.   Yes [provider]  brimonidine (ALPHAGAN) 0.2 % ophthalmic solution Place 1 drop into both eyes in the morning and at bedtime.  02/07/18  Yes  [provider]  cetirizine (ZYRTEC) 10 MG tablet Take 1/2 tablet daily Patient taking differently: Take 5 mg by mouth daily.  07/05/18  Yes [name], MD  Cholecalciferol (VITAMIN D3) 1000 units CAPS Take 1 capsule by mouth daily.    Yes [provider]  Cyanocobalamin (VITAMIN B12) 500 MCG TABS Take 500 mcg by mouth See admin instructions. Take one tablet every other day Patient taking differently: Take 500 mcg by mouth every other day.  10/04/18 10/04/19 Yes [name], MD  dorzolamide-timolol (COSOPT) 22.3-6.8 MG/ML ophthalmic solution Place 1 drop into both eyes 2 (two) times daily.  06/13/19  Yes [provider]  primidone (MYSOLINE) 50 MG tablet Take 1 tablet (50 mg total) by mouth 2 (two) times daily. 06/14/19  Yes Tat, Eustace Quail, DO  propranolol (INDERAL) 20 MG tablet Take 1 tablet (20 mg total) by mouth 2 (two) times daily. 07/05/18  Yes [name], MD  diclofenac sodium (VOLTAREN) 1 % GEL Apply 4 g topically 4 (four) times daily. Patient not taking: Reported on 07/07/2019 07/05/18   [name], MD  fluticasone [facility]) 50 MCG/ACT nasal spray Place 2 sprays into both nostrils daily. 07/05/18 07/05/19  [name], MD  Travoprost, BAK Free, (TRAVATAN Z) 0.004 %  SOLN ophthalmic solution Place 1 drop into both eyes at bedtime. Patient not taking: Reported on 07/02/2019 10/18/15   Rivet, Sindy Guadeloupe, MD     Critical care time: 45 min

## 2019-07-12 NOTE — Progress Notes (Signed)
OT Cancellation Note  Patient Details Name: Jared Woodward MRN: [id number] DOB: 1940-07-24   Cancelled Treatment:    Reason Eval/Treat Not Completed: Active bedrest order;Patient not medically ready. Bedrest, intubated, sedated. Will return as schedule allows and pt medically ready. Thank you.  [place], OTR/L Acute Rehab Pager: [phone] Office: [phone] 07/12/2019, 9:15 AM

## 2019-07-12 NOTE — Progress Notes (Signed)
Peripherally Inserted Central Catheter Placement  The IV Nurse has discussed with the patient and/or persons authorized to consent for the patient, the purpose of this procedure and the potential benefits and risks involved with this procedure.  The benefits include less needle sticks, lab draws from the catheter, and the patient may be discharged home with the catheter. Risks include, but not limited to, infection, bleeding, blood clot (thrombus formation), and puncture of an artery; nerve damage and irregular heartbeat and possibility to perform a PICC exchange if needed/ordered by physician.  Alternatives to this procedure were also discussed.  Bard Power PICC patient education guide, fact sheet on infection prevention and patient information card has been provided to patient /or left at bedside.    PICC/Midline Placement Documentation  PICC Triple Lumen 07/12/19 PICC Left Brachial 41 cm 0 cm (Active)  Indication for Insertion or Continuance of Line Vasoactive infusions 07/12/19 1750  Exposed Catheter (cm) 0 cm 07/12/19 1750  Site Assessment Clean;Dry;Intact 07/12/19 1750  Lumen #1 Status Flushed;Saline locked;Blood return noted 07/12/19 1750  Lumen #2 Status Flushed;Saline locked;Blood return noted 07/12/19 1750  Lumen #3 Status Flushed;Saline locked;Blood return noted 07/12/19 1750  Dressing Type Transparent;Securing device 07/12/19 1750  Dressing Status Clean;Dry;Intact;Antimicrobial disc in place 07/12/19 1750  Dressing Intervention New dressing 07/12/19 1750  Dressing Change Due 07/19/19 07/12/19 1750    PICC placed on LUA due to R arm flaccidity.    Enos Fling 07/12/2019, 5:53 PM

## 2019-07-12 NOTE — Progress Notes (Signed)
PT Cancellation Note  Patient Details Name: Jared Woodward MRN: [id number] DOB: March 03, 1941   Cancelled Treatment:    Reason Eval/Treat Not Completed: Medical issues which prohibited therapy.  Pt is intubated and sedated.  Will see when able. 07/12/2019  Ginger Carne., PT Acute Rehabilitation Services [phone]  (pager) [phone]  (office)   [name] 07/12/2019, 11:28 AM

## 2019-07-12 NOTE — Progress Notes (Signed)
STROKE TEAM PROGRESS NOTE   INTERVAL HISTORY RN at bedside. Pt is intubated, eyes closed, not following commands, not open eyes on voice or pain. BP under control with cleviprex.   Vitals:   07/12/19 0845 07/12/19 0900 07/12/19 0930 07/12/19 1000  BP: 125/74 126/72 133/66 127/66  Pulse: 84 81 86 81  Resp: 12 12 12 12   Temp:      TempSrc:      SpO2: 100% 100% 100% 100%  Weight:        CBC:  Recent Labs  Lab 07/06/2019 1957 07/06/2019 2005 07/10/2019 2107 07/12/19 0353  WBC 7.8  --   --   --   NEUTROABS 5.1  --   --   --   HGB 10.7*   < > 10.5* 9.9*  HCT 33.0*   < > 31.0* 29.0*  MCV 100.3*  --   --   --   PLT 192  --   --   --    < > = values in this interval not displayed.    Basic Metabolic Panel:  Recent Labs  Lab 07/03/2019 1957 07/15/2019 1957 07/27/2019 2005 07/24/2019 2005 07/23/2019 2107 06/28/2019 2259 07/12/19 0353 07/12/19 0443  NA 141   < > 142   < > 141   < > 143 143  K 5.0   < > 4.7   < > 4.9  --  4.5  --   CL 104  --  106  --   --   --   --   --   CO2 27  --   --   --   --   --   --   --   GLUCOSE 96  --  88  --   --   --   --   --   BUN 20  --  22  --   --   --   --   --   CREATININE 2.18*  --  2.20*  --   --   --   --   --   CALCIUM 9.1  --   --   --   --   --   --   --    < > = values in this interval not displayed.   Lipid Panel:     Component Value Date/Time   CHOL 133 02/17/2017 1052   TRIG 134 07/21/2019 2259   HDL 46 02/17/2017 1052   CHOLHDL 2.9 02/17/2017 1052   CHOLHDL 2.9 02/04/2011 1046   VLDL 12 02/04/2011 1046   LDLCALC 72 02/17/2017 1052   HgbA1c: No results found for: HGBA1C Urine Drug Screen:     Component Value Date/Time   LABOPIA NONE DETECTED 07/12/2019 0004   COCAINSCRNUR NONE DETECTED 07/12/2019 0004   LABBENZ POSITIVE (A) 07/12/2019 0004   AMPHETMU NONE DETECTED 07/12/2019 0004   THCU NONE DETECTED 07/12/2019 0004   LABBARB POSITIVE (A) 07/12/2019 0004    Alcohol Level     Component Value Date/Time   ETH <10 07/03/2019  1957    IMAGING past 24 hours CT ANGIO HEAD W OR WO CONTRAST  Result Date: 07/12/2019 CLINICAL DATA:  Intracranial hemorrhage. EXAM: CT ANGIOGRAPHY HEAD TECHNIQUE: Multidetector CT imaging of the head was performed using the standard protocol during bolus administration of intravenous contrast. Multiplanar CT image reconstructions and MIPs were obtained to evaluate the vascular anatomy. CONTRAST:  49mL OMNIPAQUE IOHEXOL 350 MG/ML SOLN COMPARISON:  None. FINDINGS: Allowing for differences in  slice selection, large intraparenchymal hematoma centered in the left parietal lobe is unchanged size. Surrounding edema has worsened slightly. At the level of the foramina of [place], rightward midline shift measures 11 mm, unchanged. There is persistent narrowing of the cerebral aqueduct. POSTERIOR CIRCULATION: --Vertebral arteries: Normal V4 segments. --Posterior inferior cerebellar arteries (PICA): Patent origins from the vertebral arteries. --Anterior inferior cerebellar arteries (AICA): Patent origins from the basilar artery. --Basilar artery: Normal. --Superior cerebellar arteries: Normal. --Posterior cerebral arteries: Normal. There are bilateral posterior communicating arteries (p-comm) that partially supply the PCAs. ANTERIOR CIRCULATION: --Intracranial internal carotid arteries: Normal. --Anterior cerebral arteries (ACA): Normal. Both A1 segments are present. Patent anterior communicating artery (a-comm). --Middle cerebral arteries (MCA): Left MCA branches are displaced anteriorly due to mass effect from the left parietal lobe hemorrhage. Venous sinuses: As permitted by contrast timing, patent. Anatomic variants: None IMPRESSION: 1. Unchanged size of large left parietal lobe intraparenchymal hematoma with slight worsening of surrounding edema and unchanged 11 mm rightward midline shift. 2. No aneurysm or vascular malformation. 3. Anterior displacement of the left MCA branches due to mass effect from the  intraparenchymal hematoma. Electronically Signed   By: Ulyses Jarred M.D.   On: 07/12/2019 01:09   DG Chest Portable 1 View  Result Date: 07/04/2019 CLINICAL DATA:  Altered mental status. EXAM: PORTABLE CHEST 1 VIEW COMPARISON:  October 23, 2018 FINDINGS: Normal cardiomediastinal silhouette. An endotracheal tube tip projects 5.1 cm above the carina. An enteric tube projects on the left upper abdomen in the expected location of the gastric lumen. Chronic mild hyperinflation with shallow lung inflation. A complex thick walled peripherally calcified pneumatocele is redemonstrated in the left upper lobe with similar radiographic appearance compared to June 2020. Probable bilateral nipple shadows project on the fifth anterior ribs; no pulmonary nodularity was present in this region on the remote April 25, 2016 CT chest examination. Aortic knob calcified atherosclerosis and ectasia. Telemetry leads. No pneumonia or pulmonary edema. No obvious pleural effusion or pneumothorax. No free subdiaphragmatic air. IMPRESSION: Support device placement; a 1.6 cm advancement of the endotracheal tube could be considered. 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 Once the patient has recovered from his current acute illness, outpatient CT chest without intravenous contrast recommended to better assess the stability of the pneumatocele compared to 2017. Probable bilateral nipple shadows. No pneumonia or obvious pleural effusion or pulmonary edema. Thoracic aortic calcified atherosclerosis and ectasia. Electronically Signed   By: Revonda Humphrey   On: 06/27/2019 20:54   ECHOCARDIOGRAM COMPLETE  Result Date: 07/12/2019    ECHOCARDIOGRAM REPORT   Patient Name:   Jared Woodward Date of Exam: 07/12/2019 Medical Rec #:  [id number]         Height:       68.0 in Accession #:    [id number]        Weight:       121.3 lb Date  of Birth:  06-09-1940          BSA:          1.652 m Patient Age:    79 years          BP:           128/78 mmHg Patient Gender: M                 HR:           86 bpm. Exam Location:  Inpatient Procedure: 2D Echo, Cardiac Doppler and Color Doppler Indications:    Stroke 434.91/I163.9  History:        Patient has no prior history of Echocardiogram examinations.                 Risk Factors:Hypertension, Dyslipidemia and Former Smoker. CKD.  Sonographer:    Clayton Lefort RDCS (AE) Referring Phys: [id number]  1. Left ventricular ejection fraction, by estimation, is 65 to 70%. The left ventricle has normal function. The left ventricle has no regional wall motion abnormalities. There is moderate left ventricular hypertrophy. Left ventricular diastolic parameters are consistent with Grade I diastolic dysfunction (impaired relaxation).  2. There is moderately elevated pulmonary artery systolic pressure. The estimated right ventricular systolic pressure is 38 mmHg+ RAP (on vent).  3. The aortic valve is tricuspid. Aortic valve regurgitation is mild to moderate. Mild aortic valve sclerosis is present, with no evidence of aortic valve stenosis.  4. The inferior vena cava is normal in size with <50% respiratory variability, suggesting right atrial pressure of 8 mmHg.  5. The mitral valve is grossly normal. Trivial mitral valve regurgitation. FINDINGS  Left Ventricle: Left ventricular ejection fraction, by estimation, is 65 to 70%. The left ventricle has normal function. The left ventricle has no regional wall motion abnormalities. The left ventricular internal cavity size was normal in size. There is  moderate left ventricular hypertrophy. Left ventricular diastolic parameters are consistent with Grade I diastolic dysfunction (impaired relaxation). Indeterminate filling pressures. Right Ventricle: The right ventricular size is normal. No increase in right ventricular wall thickness. Right ventricular systolic  function is normal. Left Atrium: Left atrial size was normal in size. Right Atrium: Right atrial size was normal in size. Pericardium: There is no evidence of pericardial effusion. Mitral Valve: The mitral valve is grossly normal. Trivial mitral valve regurgitation. Tricuspid Valve: The tricuspid valve is grossly normal. Tricuspid valve regurgitation is mild. Aortic Valve: The aortic valve is tricuspid. Aortic valve regurgitation is mild to moderate. Mild aortic valve sclerosis is present, with no evidence of aortic valve stenosis. Pulmonic Valve: The pulmonic valve was grossly normal. Pulmonic valve regurgitation is not visualized. Aorta: The aortic root and ascending aorta are structurally normal, with no evidence of dilitation. Venous: The inferior vena cava is normal in size with less than 50% respiratory variability, suggesting right atrial pressure of 8 mmHg. IAS/Shunts: No atrial level shunt detected by color flow Doppler.  LEFT VENTRICLE PLAX 2D LVIDd:         4.26 cm  Diastology LVIDs:         2.47 cm  LV e' lateral:   8.92 cm/s LV PW:         1.08 cm  LV E/e' lateral: 9.6 LV IVS:        1.43 cm  LV e' medial:    5.33 cm/s LVOT diam:     2.20 cm  LV E/e' medial:  16.1 LV SV:         76 LV SV Index:   46 LVOT Area:     3.80 cm  RIGHT VENTRICLE             IVC RV Basal diam:  2.35 cm     IVC diam: 1.39 cm RV S prime:     16.80 cm/s TAPSE (M-mode): 2.4 cm LEFT ATRIUM             Index       RIGHT ATRIUM  Index LA diam:        2.30 cm 1.39 cm/m  RA Area:     13.80 cm LA Vol (A2C):   15.3 ml 9.26 ml/m  RA Volume:   34.40 ml  20.82 ml/m LA Vol (A4C):   18.1 ml 10.96 ml/m LA Biplane Vol: 17.8 ml 10.77 ml/m  AORTIC VALVE AV Area (Vmax):    2.40 cm AV Area (Vmean):   2.39 cm AV Area (VTI):     2.46 cm AV Vmax:           176.00 cm/s AV Vmean:          106.000 cm/s AV VTI:            0.309 m AV Peak Grad:      12.4 mmHg AV Mean Grad:      6.0 mmHg LVOT Vmax:         111.00 cm/s LVOT Vmean:         66.600 cm/s LVOT VTI:          0.200 m LVOT/AV VTI ratio: 0.65  AORTA Ao Root diam: 2.70 cm Ao Asc diam:  3.10 cm MITRAL VALVE                TRICUSPID VALVE MV Area (PHT): 3.77 cm     TR Peak grad:   37.9 mmHg MV Peak grad:  3.9 mmHg     TR Vmax:        308.00 cm/s MV Mean grad:  2.0 mmHg MV Vmax:       0.99 m/s     SHUNTS MV Vmean:      66.0 cm/s    Systemic VTI:  0.20 m MV Decel Time: 201 msec     Systemic Diam: 2.20 cm MV E velocity: 85.70 cm/s MV A velocity: 102.00 cm/s MV E/A ratio:  0.84 Lyman Bishop MD Electronically signed by Lyman Bishop MD Signature Date/Time: 07/12/2019/12:41:24 PM    Final    CT HEAD CODE STROKE WO CONTRAST  Addendum Date: 07/07/2019   ADDENDUM REPORT: 07/18/2019 20:30 ADDENDUM: These results were called by telephone at the time of interpretation on 07/22/2019 at 8:30 pm to provider Dr. Lorraine Lax, who verbally acknowledged these results. Electronically Signed   By: Kellie Simmering DO   On: 07/26/2019 20:30   Result Date: 07/24/2019 CLINICAL DATA:  Code stroke. Neuro deficit, acute, stroke suspected. EXAM: CT HEAD WITHOUT CONTRAST TECHNIQUE: Contiguous axial images were obtained from the base of the skull through the vertex without intravenous contrast. COMPARISON:  No pertinent prior studies available for comparison. FINDINGS: Brain: There is a large acute parenchymal hemorrhage with prominent surrounding edema centered within the superior left temporal lobe and left temporoparietal junction. The dominant portion of the hemorrhage measures 7.4 x 4.5 x 4.6 cm (AP x TV x CC). There is also small volume intraventricular extension into the left lateral ventricle (series 5, image 37). There is significant mass effect with partial effacement of the left lateral and third ventricles. 11 mm rightward midline shift. No definite effacement of the basal cisterns on the current study. There is also small volume curvilinear hyperdensity along the inferior aspect of the hemorrhage, which may reflect  subarachnoid extension. Vascular: No definite hyperdense vessel. Skull: Normal. Negative for fracture or focal lesion. Sinuses/Orbits: Visualized orbits demonstrate no acute abnormality. No significant paranasal sinus disease or mastoid effusion at the imaged levels. IMPRESSION: Large 7.4 x 4.5 x 4.6 cm acute parenchymal hemorrhage with prominent  surrounding edema centered within the superior left temporal lobe and left temporoparietal junction. Marked mass effect with partial effacement of the left lateral and third ventricles. 11 mm rightward midline shift. Small volume intraventricular extension into the left lateral ventricle. There is also probable small volume subarachnoid extension of hemorrhage along the inferior aspect of the hematoma. Electronically Signed: By: Kellie Simmering DO On: 07/07/2019 20:21   [place] EKG SITE RITE  Result Date: 07/12/2019 If Site Rite image not attached, placement could not be confirmed due to current cardiac rhythm.   PHYSICAL EXAM  Temp:  [96.2 F (35.7 C)-99.3 F (37.4 C)] 99.3 F (37.4 C) (03/16 0800) Pulse Rate:  [25-86] 81 (03/16 1000) Resp:  [11-20] 12 (03/16 1000) BP: (96-188)/(45-114) 127/66 (03/16 1000) SpO2:  [84 %-100 %] 100 % (03/16 1000) FiO2 (%):  [40 %] 40 % (03/16 0752) Weight:  [55 kg] 55 kg (03/15 2331)  General - Well nourished, well developed, intubated on sedation.  Ophthalmologic - fundi not visualized due to noncooperation.  Cardiovascular - Regular rate and rhythm.  Neuro - intubated on sedation, eyes closed, not following commands. With forced eye opening, eyes in mid position, not blinking to visual threat, doll's eyes present but sluggish, not tracking, PERRL. Corneal reflex weak on the left but diminished on the right, gag and cough strong and sensitive. Breathing over the vent.  Facial symmetry not able to test due to ET tube.  Tongue protrusion not cooperative. On pain stimulation, spontaneous purposeful movement of LUE able to  against gravity as well as LLE but not able to against gravity, however no movement of RUE and RLE. DTR diminished and no babinski. Sensation, coordination and gait not tested.   ASSESSMENT/PLAN [name] is a 79 y.o. male with history of hypertension,MGUS, essential tremor, mental retardation who was found unresponsive, bagged and paced by EMS, presenting non-verbal with R sided hemiplegia.   Stroke:  large L temporoparietal hemorrhage w/ IVH and SAH likely secondary to hypertensive vs underlying structural lesion   CT head large L superior L temporal lobe and L temporoparietal  jxn IPH w/ marked mass effect w/ partial effacement L lateral and 3rd ventricles. 25mm R midline shift, small IVH L ventricle. Probable small SAH.   CTA head L parietal IPH same w/ worsening edema, stable midline shift. Anterior displacement L MCA branches d/t mass effect.   Repeat CT pending in am   2D Echo EF 65-70%. No source of embolus   LDL pending   HgbA1c pending   SCDs for VTE prophylaxis  aspirin 81 mg daily prior to admission, now on No antithrombotic given hemorrhage   Therapy recommendations:  pending   Disposition:  pending   Cytotoxic Cerebral Edema Induced Hypernatremia  NSG consulted (jones) no role for surgical intervention  CT head midline shift 11 mm, out of proportion for fresh ICH, concerning for underlying structural lesion  On 3% hypertonic saline protocol  Na 140->143->150   Goal Na 150-155  Check Na q6h  Acute Respiratory Failure   secondary to stroke  Intubated for airway protection  Sedated   CCM onboard  Hypertensive Emergency  BP as high as 188/88 on arrival  Home meds:  norvasc 5  Now on cleviprex gtt . SBP goal < 140 . Long-term BP goal normotensive  Dysphagia . Secondary to stroke . NPO . Speech on board   Other Stroke Risk Factors  Advanced age  Former Cigarette smoker, quit 31 yrs ago  Other  Active Problems  Mental  retardation   Essential tremor on mysoline, propranolol  MGUS  Anemia of chronic dz 10.9->9.9->10.5  CKD IIIb, Cre 2.18->2.2->1.9  Hospital day # 1  This patient is critically ill due to large left temporal ICH, IVH with midline shift, hypertensive emergency and at significant risk of neurological worsening, death form hematoma expansion, hydrocephalus, cerebral edema, brain herniation, seizure. This patient's care requires constant monitoring of vital signs, hemodynamics, respiratory and cardiac monitoring, review of multiple databases, neurological assessment, discussion with family, other specialists and medical decision making of high complexity. I spent 40 minutes of neurocritical care time in the care of this patient.  I had long discussion with daughter over the phone, updated pt current condition, treatment plan and potential prognosis, and answered all the questions.  She expressed understanding and appreciation.   Rosalin Hawking, MD PhD Stroke Neurology 07/12/2019 7:44 PM  To contact Stroke Continuity provider, please refer to [email or web address] After hours, contact General Neurology

## 2019-07-12 NOTE — Progress Notes (Signed)
Consent for PICC needed.  Attempted to contact niece Maurine Minister, no answer to home number listed.  Person answering cell number listed stated that she was not available and to contact Raeford Razor.  Unable to get an answer at either numbers listed.  Will attempt to contact again later.

## 2019-07-12 NOTE — Progress Notes (Signed)
  Echocardiogram 2D Echocardiogram has been performed.  Jared Woodward 07/12/2019, 12:07 PM

## 2019-07-12 NOTE — Progress Notes (Signed)
SLP Cancellation Note  Patient Details Name: Jared Woodward MRN: [id number] DOB: [birth year]   Cancelled treatment:       Reason Eval/Treat Not Completed: Patient not medically ready (on the vent). Will follow up.    Osie Bond., M.A. [facility] Pager [phone] Office [phone]  07/12/2019, 8:01 AM

## 2019-07-13 ENCOUNTER — Inpatient Hospital Stay (HOSPITAL_COMMUNITY): Payer: Medicare Other

## 2019-07-13 DIAGNOSIS — Z978 Presence of other specified devices: Secondary | ICD-10-CM

## 2019-07-13 DIAGNOSIS — E440 Moderate protein-calorie malnutrition: Secondary | ICD-10-CM | POA: Diagnosis present

## 2019-07-13 DIAGNOSIS — J9600 Acute respiratory failure, unspecified whether with hypoxia or hypercapnia: Secondary | ICD-10-CM | POA: Diagnosis present

## 2019-07-13 DIAGNOSIS — I610 Nontraumatic intracerebral hemorrhage in hemisphere, subcortical: Secondary | ICD-10-CM

## 2019-07-13 DIAGNOSIS — I161 Hypertensive emergency: Secondary | ICD-10-CM

## 2019-07-13 LAB — CBC
HCT: 30.5 % — ABNORMAL LOW (ref 39.0–52.0)
Hemoglobin: 9.7 g/dL — ABNORMAL LOW (ref 13.0–17.0)
MCH: 33.3 pg (ref 26.0–34.0)
MCHC: 31.8 g/dL (ref 30.0–36.0)
MCV: 104.8 fL — ABNORMAL HIGH (ref 80.0–100.0)
Platelets: 166 10*3/uL (ref 150–400)
RBC: 2.91 MIL/uL — ABNORMAL LOW (ref 4.22–5.81)
RDW: 14.5 % (ref 11.5–15.5)
WBC: 9.3 10*3/uL (ref 4.0–10.5)
nRBC: 0 % (ref 0.0–0.2)

## 2019-07-13 LAB — GLUCOSE, CAPILLARY
Glucose-Capillary: 133 mg/dL — ABNORMAL HIGH (ref 70–99)
Glucose-Capillary: 137 mg/dL — ABNORMAL HIGH (ref 70–99)
Glucose-Capillary: 141 mg/dL — ABNORMAL HIGH (ref 70–99)
Glucose-Capillary: 174 mg/dL — ABNORMAL HIGH (ref 70–99)

## 2019-07-13 LAB — BASIC METABOLIC PANEL
Anion gap: 10 (ref 5–15)
BUN: 18 mg/dL (ref 8–23)
CO2: 22 mmol/L (ref 22–32)
Calcium: 8.2 mg/dL — ABNORMAL LOW (ref 8.9–10.3)
Chloride: 126 mmol/L — ABNORMAL HIGH (ref 98–111)
Creatinine, Ser: 1.96 mg/dL — ABNORMAL HIGH (ref 0.61–1.24)
GFR calc Af Amer: 37 mL/min — ABNORMAL LOW (ref >60)
GFR calc non Af Amer: 32 mL/min — ABNORMAL LOW (ref >60)
Glucose, Bld: 145 mg/dL — ABNORMAL HIGH (ref 70–99)
Potassium: 4.5 mmol/L (ref 3.5–5.1)
Sodium: 158 mmol/L — ABNORMAL HIGH (ref 135–145)

## 2019-07-13 LAB — LIPID PANEL
Cholesterol: 117 mg/dL (ref 0–200)
HDL: 27 mg/dL — ABNORMAL LOW (ref >40)
LDL Cholesterol: 43 mg/dL (ref 0–99)
Total CHOL/HDL Ratio: 4.3 RATIO
Triglycerides: 236 mg/dL — ABNORMAL HIGH (ref <150)
VLDL: 47 mg/dL — ABNORMAL HIGH (ref 0–40)

## 2019-07-13 LAB — HEMOGLOBIN A1C
Hgb A1c MFr Bld: 5.8 % — ABNORMAL HIGH (ref 4.8–5.6)
Mean Plasma Glucose: 119.76 mg/dL

## 2019-07-13 LAB — SODIUM
Sodium: 152 mmol/L — ABNORMAL HIGH (ref 135–145)
Sodium: 163 mmol/L (ref 135–145)
Sodium: 162 mmol/L (ref 135–145)

## 2019-07-13 MED ORDER — METOPROLOL TARTRATE 25 MG/10 ML ORAL SUSPENSION
25.0000 mg | Freq: Two times a day (BID) | ORAL | Status: DC
  Administered 2019-07-13: 25 mg
  Filled 2019-07-13 (×2): qty 10

## 2019-07-13 MED ORDER — AMLODIPINE BESYLATE 5 MG PO TABS
5.0000 mg | ORAL_TABLET | Freq: Every day | ORAL | Status: DC
  Filled 2019-07-13: qty 1

## 2019-07-13 MED ORDER — PROPRANOLOL HCL 20 MG PO TABS: 20.0000 mg | ORAL_TABLET | Freq: Two times a day (BID) | ORAL | Status: DC

## 2019-07-13 MED ORDER — AMLODIPINE BESYLATE 10 MG PO TABS
10.0000 mg | ORAL_TABLET | Freq: Every day | ORAL | Status: DC
  Administered 2019-07-14 – 2019-07-16 (×3): 10 mg
  Filled 2019-07-13 (×3): qty 1

## 2019-07-13 MED ORDER — SODIUM CHLORIDE 0.9 % IV SOLN: INTRAVENOUS | Status: DC

## 2019-07-13 MED ORDER — VITAL HIGH PROTEIN PO LIQD
1000.0000 mL | ORAL | Status: DC
  Administered 2019-07-13 – 2019-07-14 (×2): 1000 mL

## 2019-07-13 MED ORDER — PANTOPRAZOLE SODIUM 40 MG PO TBEC: 40.0000 mg | DELAYED_RELEASE_TABLET | Freq: Every day | ORAL | Status: DC

## 2019-07-13 MED ORDER — AMLODIPINE BESYLATE 5 MG PO TABS
5.0000 mg | ORAL_TABLET | Freq: Every day | ORAL | Status: DC
  Administered 2019-07-13: 5 mg

## 2019-07-13 MED ORDER — PANTOPRAZOLE SODIUM 40 MG PO PACK
40.0000 mg | PACK | Freq: Every day | ORAL | Status: DC
  Administered 2019-07-13 – 2019-07-16 (×4): 40 mg
  Filled 2019-07-13 (×4): qty 20

## 2019-07-13 MED ORDER — METOPROLOL TARTRATE 25 MG/10 ML ORAL SUSPENSION
50.0000 mg | Freq: Two times a day (BID) | ORAL | Status: DC
  Administered 2019-07-13 – 2019-07-16 (×6): 50 mg
  Filled 2019-07-13 (×6): qty 20

## 2019-07-13 MED ORDER — PRO-STAT SUGAR FREE PO LIQD
30.0000 mL | Freq: Three times a day (TID) | ORAL | Status: DC
  Administered 2019-07-13 – 2019-07-16 (×10): 30 mL
  Filled 2019-07-13 (×10): qty 30

## 2019-07-13 NOTE — Progress Notes (Signed)
OT Cancellation Note  Patient Details Name: Jared Woodward MRN: [id number] DOB: June 24, 1940   Cancelled Treatment:    Reason Eval/Treat Not Completed: Medical issues which prohibited therapy; pt remains intubated/sedated. Spoke with RN and noted per most recent critical care MD note pt with poor prognosis at this time. Will sign off from acute OT. Should pt status change please re-consult. Thank you.  Lou Cal, OT Acute Rehabilitation Services Pager [phone] Office [phone]   Raymondo Band 07/13/2019, 9:37 AM

## 2019-07-13 NOTE — Progress Notes (Signed)
NAME:  Jared Woodward, MRN:  [id number], DOB:  1941-02-23, LOS: 2 ADMISSION DATE:  07/04/2019, CONSULTATION DATE:  06/28/2019 REFERRING MD: [place], CHIEF COMPLAINT:  AMS  Brief History    79 year old man with hx of HTN, anemia of chronic disease, HLD, cognitive impairment, CKD stage IV, found unresponsive by family in bathroom after dinner.    EMS called, bagged and cv pacing while en route to hospital (given versed by EMS).    Hypertensive on arrival 213-086 systolic   Cleviprex started - BP controlled.  History of present illness   Arrival to ED, R sided hemiplegia.   Awake but non verbal on initial arrival (per neuro note) Intubated for airway protection.   Past Medical History  HTN Glaucoma Amaurosis fugax HLD,  ACD CKD IV Cognitive impairment MGUS  Essential tremor  Significant Hospital Events    Consults:  Neurology primary PCCM consult  Procedures:  LUE PICC 3/16 >   Significant Diagnostic Tests:  CXR 07/23/2019 > no acute process.  Chronic mild hyperinflation and thick-walled peripherally calcified pneumatocele in the left upper lobe, which could be infectious,inflammatory or malignant. Recommend outpatient follow up. CT angio 07/12/19 > Unchanged size of large left parietal lobe IPH with slight worsening of surrounding edema and unchanged 11 mm rightward midline shift. No aneurysm or vascular malformation.  Anterior displacement of the left MCA branches due to mass effect from the intraparenchymal hematoma. Echo 3/16 > EF 65-70%, G1DD.  Micro Data:   Antimicrobials:    Interim history/subjective:  No acute events. Remains unresponsive.  Objective   Blood pressure 112/83, pulse 98, temperature 98.3 F (36.8 C), temperature source Oral, resp. rate 12, weight 55 kg, SpO2 100 %.    Vent Mode: PRVC FiO2 (%):  [40 %] 40 % Set Rate:  [12 bmp] 12 bmp Vt Set:  [580 mL] 580 mL PEEP:  [5 cmH20] 5 cmH20 Plateau Pressure:  [16 cmH20-19 cmH20] 18 cmH20    Intake/Output Summary (Last 24 hours) at 07/13/2019 0839 Last data filed at 07/13/2019 0548 Gross per 24 hour  Intake 2135.34 ml  Output 1600 ml  Net 535.34 ml   Filed Weights   07/22/2019 2331  Weight: 55 kg    Examination: General: Adult male, in NAD. HENT: [place]/AT.  ETT in place. Lungs: CTAB. Cardiovascular: RRR no mgr. Abdomen: BS x 4, S/NT/ND. Extremities: Moves left side to noxious stimuli. Neuro: not responsive.  Assessment & Plan:   Left Hemorrhagic stroke with IVH and SAH - felt to be 2/2 HTN vs underlying structural lesion. Cytotoxic edema. - Stroke workup / management per neuro. - No surgical plans per neurosurgery as poor prognosis for meaningful recovery. - Continue 3% NS.  Hypertensive emergency. - Continue cleviprex and norvasc as needed for goal SBP < 140 for now.  Intubation for airway protection - 2/2 above. - Continue full vent support. - No weaning 2/2 mental status. - Will need GOC discussions if does not improve over next 3 - 4 days as would need trach if family wishes for continued aggressive measures. - Bronchial hygiene. - Follow CXR.  ? Chronic LUL pneumatocele. - Outpatient CT if has miraculous recovery from this stroke.  Best practice:  Diet: Tube feeds. Pain/Anxiety/Delirium protocol (if indicated): on propofol  VAP protocol (if indicated): yes  DVT prophylaxis: held, SCDs GI prophylaxis: protonix Glucose control:  Mobility: bed Code Status: Full  Family Communication: Per primary. Disposition: ICU  CC time: 35 min.   Montey Hora, [place] -  [facility] 07/13/2019, 8:47 AM

## 2019-07-13 NOTE — Progress Notes (Signed)
PT Cancellation/Discharge Note  Patient Details Name: Jared Woodward MRN: [id number] DOB: [birth year]   Cancelled Treatment:    Reason Eval/Treat Not Completed: PT screened, no needs identified, will sign off  Noted remains intubated and sedated. Has 11 mm shift. Spoke with RN and agrees with PT signing off at this time. Please reorder if pt becomes appropriate for activity.   Arby Barrette, PT Pager [phone]  Rexanne Mano 07/13/2019, 9:37 AM

## 2019-07-13 NOTE — Progress Notes (Signed)
STROKE TEAM PROGRESS NOTE   INTERVAL HISTORY RN at bedside. Pt still intubated, barely open eyes on pain, but purposefully moving left UE and LE, eyes shut to against forced opening. However, CT showed increased cerebral edema and MLS. On 3% saline with Na 158. On trickle feeds.     Vitals:   07/13/19 0530 07/13/19 0545 07/13/19 0743 07/13/19 0841  BP: 131/78 118/78 112/83   Pulse: (!) 104 95 98   Resp: 14 12 12    Temp:      TempSrc:      SpO2: 100% 100% 100% 100%  Weight:       CBC:  Recent Labs  Lab 07/08/2019 1957 07/13/2019 2005 07/12/19 1014 07/13/19 0445  WBC 7.8   < > 8.3 9.3  NEUTROABS 5.1  --   --   --   HGB 10.7*   < > 10.5* 9.7*  HCT 33.0*   < > 31.5* 30.5*  MCV 100.3*   < > 98.1 104.8*  PLT 192   < > 183 166   < > = values in this interval not displayed.   Basic Metabolic Panel:  Recent Labs  Lab 07/12/19 1014 07/12/19 1808 07/12/19 2259 07/13/19 0445  NA 147*   < > 152* 158*  K 4.9  --   --  4.5  CL 114*  --   --  126*  CO2 19*  --   --  22  GLUCOSE 125*  --   --  145*  BUN 20  --   --  18  CREATININE 1.90*  --   --  1.96*  CALCIUM 8.7*  --   --  8.2*   < > = values in this interval not displayed.   Lipid Panel:     Component Value Date/Time   CHOL 117 07/13/2019 0445   CHOL 133 02/17/2017 1052   TRIG 236 (H) 07/13/2019 0445   HDL 27 (L) 07/13/2019 0445   HDL 46 02/17/2017 1052   CHOLHDL 4.3 07/13/2019 0445   VLDL 47 (H) 07/13/2019 0445   LDLCALC 43 07/13/2019 0445   LDLCALC 72 02/17/2017 1052   HgbA1c:  Lab Results  Component Value Date   HGBA1C 5.8 (H) 07/13/2019   Urine Drug Screen:     Component Value Date/Time   LABOPIA NONE DETECTED 07/12/2019 0004   COCAINSCRNUR NONE DETECTED 07/12/2019 0004   LABBENZ POSITIVE (A) 07/12/2019 0004   AMPHETMU NONE DETECTED 07/12/2019 0004   THCU NONE DETECTED 07/12/2019 0004   LABBARB POSITIVE (A) 07/12/2019 0004    Alcohol Level     Component Value Date/Time   ETH <10 07/09/2019 1957     IMAGING past 24 hours CT HEAD WO CONTRAST  Result Date: 07/13/2019 CLINICAL DATA:  Stroke follow-up EXAM: CT HEAD WITHOUT CONTRAST TECHNIQUE: Contiguous axial images were obtained from the base of the skull through the vertex without intravenous contrast. COMPARISON:  Yesterday FINDINGS: Brain: Large irregularly-shaped hematoma in the left cerebrum with irregular shape limiting reproducible measurement. The high-density component is not clearly changed but there is a low-density medial component (that is distinguishable given lining by high-density clot) that is increased. There is prominent leftward mediastinal shift which measures up to 11 mm. Shift has increased by 2 mm. No entrapment or visible ACA territory infarct. Vascular: Atherosclerotic calcification Skull: No acute finding Sinuses/Orbits: No acute finding IMPRESSION: Extensive left cerebral hemorrhage with increased low-density component. Midline shift has increased by 2 mm to 11 mm. Electronically Signed  By: Monte Fantasia M.D.   On: 07/13/2019 06:31   ECHOCARDIOGRAM COMPLETE  Result Date: 07/12/2019    ECHOCARDIOGRAM REPORT   Patient Name:   [name] Date of Exam: 07/12/2019 Medical Rec #:  [id number]         Height:       68.0 in Accession #:    [id number]        Weight:       121.3 lb Date of Birth:  December 18, 1940          BSA:          1.652 m Patient Age:    79 years          BP:           128/78 mmHg Patient Gender: M                 HR:           86 bpm. Exam Location:  Inpatient Procedure: 2D Echo, Cardiac Doppler and Color Doppler Indications:    Stroke 434.91/I163.9  History:        Patient has no prior history of Echocardiogram examinations.                 Risk Factors:Hypertension, Dyslipidemia and Former Smoker. CKD.  Sonographer:    Clayton Lefort RDCS (AE) Referring Phys: [id number] [place]  1. Left ventricular ejection fraction, by estimation, is 65 to 70%. The left ventricle has normal function. The left  ventricle has no regional wall motion abnormalities. There is moderate left ventricular hypertrophy. Left ventricular diastolic parameters are consistent with Grade I diastolic dysfunction (impaired relaxation).  2. There is moderately elevated pulmonary artery systolic pressure. The estimated right ventricular systolic pressure is 38 mmHg+ RAP (on vent).  3. The aortic valve is tricuspid. Aortic valve regurgitation is mild to moderate. Mild aortic valve sclerosis is present, with no evidence of aortic valve stenosis.  4. The inferior vena cava is normal in size with <50% respiratory variability, suggesting right atrial pressure of 8 mmHg.  5. The mitral valve is grossly normal. Trivial mitral valve regurgitation. FINDINGS  Left Ventricle: Left ventricular ejection fraction, by estimation, is 65 to 70%. The left ventricle has normal function. The left ventricle has no regional wall motion abnormalities. The left ventricular internal cavity size was normal in size. There is  moderate left ventricular hypertrophy. Left ventricular diastolic parameters are consistent with Grade I diastolic dysfunction (impaired relaxation). Indeterminate filling pressures. Right Ventricle: The right ventricular size is normal. No increase in right ventricular wall thickness. Right ventricular systolic function is normal. Left Atrium: Left atrial size was normal in size. Right Atrium: Right atrial size was normal in size. Pericardium: There is no evidence of pericardial effusion. Mitral Valve: The mitral valve is grossly normal. Trivial mitral valve regurgitation. Tricuspid Valve: The tricuspid valve is grossly normal. Tricuspid valve regurgitation is mild. Aortic Valve: The aortic valve is tricuspid. Aortic valve regurgitation is mild to moderate. Mild aortic valve sclerosis is present, with no evidence of aortic valve stenosis. Pulmonic Valve: The pulmonic valve was grossly normal. Pulmonic valve regurgitation is not visualized.  Aorta: The aortic root and ascending aorta are structurally normal, with no evidence of dilitation. Venous: The inferior vena cava is normal in size with less than 50% respiratory variability, suggesting right atrial pressure of 8 mmHg. IAS/Shunts: No atrial level shunt detected by color flow Doppler.  LEFT VENTRICLE PLAX 2D LVIDd:  4.26 cm  Diastology LVIDs:         2.47 cm  LV e' lateral:   8.92 cm/s LV PW:         1.08 cm  LV E/e' lateral: 9.6 LV IVS:        1.43 cm  LV e' medial:    5.33 cm/s LVOT diam:     2.20 cm  LV E/e' medial:  16.1 LV SV:         76 LV SV Index:   46 LVOT Area:     3.80 cm  RIGHT VENTRICLE             IVC RV Basal diam:  2.35 cm     IVC diam: 1.39 cm RV S prime:     16.80 cm/s TAPSE (M-mode): 2.4 cm LEFT ATRIUM             Index       RIGHT ATRIUM           Index LA diam:        2.30 cm 1.39 cm/m  RA Area:     13.80 cm LA Vol (A2C):   15.3 ml 9.26 ml/m  RA Volume:   34.40 ml  20.82 ml/m LA Vol (A4C):   18.1 ml 10.96 ml/m LA Biplane Vol: 17.8 ml 10.77 ml/m  AORTIC VALVE AV Area (Vmax):    2.40 cm AV Area (Vmean):   2.39 cm AV Area (VTI):     2.46 cm AV Vmax:           176.00 cm/s AV Vmean:          106.000 cm/s AV VTI:            0.309 m AV Peak Grad:      12.4 mmHg AV Mean Grad:      6.0 mmHg LVOT Vmax:         111.00 cm/s LVOT Vmean:        66.600 cm/s LVOT VTI:          0.200 m LVOT/AV VTI ratio: 0.65  AORTA Ao Root diam: 2.70 cm Ao Asc diam:  3.10 cm MITRAL VALVE                TRICUSPID VALVE MV Area (PHT): 3.77 cm     TR Peak grad:   37.9 mmHg MV Peak grad:  3.9 mmHg     TR Vmax:        308.00 cm/s MV Mean grad:  2.0 mmHg MV Vmax:       0.99 m/s     SHUNTS MV Vmean:      66.0 cm/s    Systemic VTI:  0.20 m MV Decel Time: 201 msec     Systemic Diam: 2.20 cm MV E velocity: 85.70 cm/s MV A velocity: 102.00 cm/s MV E/A ratio:  0.84 Lyman Bishop MD Electronically signed by Lyman Bishop MD Signature Date/Time: 07/12/2019/12:41:24 PM    Final    [place] EKG SITE RITE  Result  Date: 07/12/2019 If Site Rite image not attached, placement could not be confirmed due to current cardiac rhythm.   PHYSICAL EXAM   Temp:  [98.3 F (36.8 C)-99.6 F (37.6 C)] 98.3 F (36.8 C) (03/17 0400) Pulse Rate:  [68-106] 98 (03/17 0743) Resp:  [12-28] 12 (03/17 0743) BP: (109-158)/(60-98) 112/83 (03/17 0743) SpO2:  [99 %-100 %] 100 % (03/17 0841) FiO2 (%):  [40 %] 40 % (03/17 0841)  General - Well nourished, well developed, intubated on sedation.  Ophthalmologic - fundi not visualized due to noncooperation.  Cardiovascular - Regular rate and rhythm.  Neuro - intubated on sedation, eyes closed, not following commands. However, he tries to keep eyes shut against forced eye opening, eyes in mid position, not blinking to visual threat, doll's eyes present, not tracking, pupils 52mm, not reactive to light. Corneal reflex weak on the left but diminished on the right, gag and cough present. Breathing over the vent.  Facial symmetry not able to test due to ET tube.  Tongue protrusion not cooperative. On pain stimulation, spontaneous purposeful movement of LUE able to against gravity as well as LLE, however no movement of RUE and RLE, dense hemiplegia. DTR diminished and no babinski. Sensation, coordination and gait not tested.   ASSESSMENT/PLAN [name] is a 78 y.o. male with history of hypertension,MGUS, essential tremor, mental retardation who was found unresponsive, bagged and paced by EMS, presenting non-verbal with R sided hemiplegia.   Stroke:  large L temporoparietal hemorrhage w/ IVH and SAH likely secondary to hypertensive vs underlying structural lesion   CT head large L superior L temporal lobe and L temporoparietal  jxn IPH w/ marked mass effect w/ partial effacement L lateral and 3rd ventricles. 26mm R midline shift, small IVH L ventricle. Probable small SAH.   CTA head L parietal IPH same w/ worsening edema, stable midline shift. Anterior displacement L MCA  branches d/t mass effect.   Repeat CT 3/17 extensive L cerebral hemorrhage and 81mm midline shift   Consider MRI with and without contrast if Cre improves   2D Echo EF 65-70%. No source of embolus   LDL 43  HgbA1c 5.8  SCDs for VTE prophylaxis  aspirin 81 mg daily prior to admission, now on No antithrombotic given hemorrhage   Therapy recommendations: signed off, reconsult when pt able to participate  Disposition:  pending   Cytotoxic Cerebral Edema Induced Hypernatremia  NSG consulted (jones) no role for surgical intervention  CT head midline shift 11 mm, out of proportion for fresh ICH, concerning for underlying structural lesion  Consider MRI with and without contrast if Cre improves   On 3% hypertonic saline protocol  Has PICC  Na 140->143->150->152->158   Goal Na 150-155  Check Na q6h  Acute Respiratory Failure   secondary to stroke  Intubated for airway protection  Sedated   CCM onboard  Hypertensivemergency  BP as high as 188/88 on arrival  Home meds:  norvasc 5  Taper off cleviprex gtt as able  Resume home norvasc and propranolol . SBP goal < 160  . Long-term BP goal normotensive  Dysphagia . Secondary to stroke . NPO . Speech on board . On trickle feeds now @ 20   Other Stroke Risk Factors  Advanced age  Former Cigarette smoker, quit 31 yrs ago  Other Active Problems  Mental retardation   Essential tremor on mysoline, propranolol  MGUS  ? Chronic LUL pneumatocele, OP CT recommended  Anemia of chronic dz 10.9->9.9->10.5->9.7  CKD IIIb, Cre 2.18->2.2->1.9->1.96  Hospital day # 2  This patient is critically ill due to large left temporal ICH, IVH with midline shift, hypertensive emergency and at significant risk of neurological worsening, death form hematoma expansion, hydrocephalus, cerebral edema, brain herniation, seizure. This patient's care requires constant monitoring of vital signs, hemodynamics, respiratory and  cardiac monitoring, review of multiple databases, neurological assessment, discussion with family, other specialists and medical decision making of high  complexity. I spent 40 minutes of neurocritical care time in the care of this patient. I discussed with CCM Dr. Charlene Brooke, MD PhD Stroke Neurology 07/13/2019 9:36 AM  To contact Stroke Continuity provider, please refer to [email or web address] After hours, contact General Neurology

## 2019-07-13 NOTE — Progress Notes (Signed)
Initial Nutrition Assessment  DOCUMENTATION CODES:   Non-severe (moderate) malnutrition in context of chronic illness  INTERVENTION:   Initiate Vital High Protein @ 20 ml/hr (480 ml/day) via OG tube 30 ml Prostat TID  Provides: 780 kcal, 87 grams protein, and 401 ml free water.  TF regimen, cleviprex, and propofol at current rate providing 2264 total kcal/day    NUTRITION DIAGNOSIS:   Moderate Malnutrition related to chronic illness as evidenced by mild fat depletion, mild muscle depletion.  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   Pt discussed during ICU rounds and with RN.  Neurosurgery states no plans for surgery, poor prognosis.  Pt on 3% and cleviprex. Intubated for airway protection.   Patient is currently intubated on ventilator support MV: 6.8 L/min Temp (24hrs), Avg:99 F (37.2 C), Min:98.3 F (36.8 C), Max:100 F (37.8 C)  Propofol: 1.7 ml/hr provides: 44 kcal  Cleviprex: 30 ml/hr provides: 1440 kcal  Medications reviewed and include: senokot-s Hypertonic saline Labs reviewed: Na 158 (H with hypertonic saline), TG: 236 (H)    NUTRITION - FOCUSED PHYSICAL EXAM:    Most Recent Value  Orbital Region  Mild depletion  Upper Arm Region  Mild depletion  Thoracic and Lumbar Region  Mild depletion  Buccal Region  Unable to assess  Temple Region  Mild depletion  Clavicle Bone Region  Moderate depletion  Clavicle and Acromion Bone Region  Moderate depletion  Scapular Bone Region  Unable to assess  Dorsal Hand  Unable to assess  Patellar Region  Mild depletion  Anterior Thigh Region  Mild depletion  Posterior Calf Region  Mild depletion   Edema (RD Assessment)  None  Hair  Reviewed  Eyes  Unable to assess  Mouth  Unable to assess  Skin  Reviewed  Nails  Unable to assess       Diet Order:   Diet Order            Diet NPO time specified  Diet effective now              EDUCATION NEEDS:   No education needs have been identified at this time  Skin:  Skin Assessment: Reviewed RN Assessment  Last BM:  unknown  Height:   Ht Readings from Last 1 Encounters:  06/13/19 5\' 8"  (1.727 m)    Weight:   Wt Readings from Last 1 Encounters:  07/16/2019 55 kg    Ideal Body Weight:  70 kg  BMI:  Body mass index is 18.44 kg/m.  Estimated Nutritional Needs:   Kcal:  1487  Protein:  85-100 grams  Fluid:  > 1.5 L/day  Lockie Pares., RD, LDN, CNSC See AMiON for contact information

## 2019-07-13 NOTE — Progress Notes (Signed)
SLP Cancellation Note  Patient Details Name: Jared Woodward MRN: [id number] DOB: 04-13-1941   Cancelled treatment:       Reason Eval/Treat Not Completed: Medical issues which prohibited therapy (on vent). Will continue to follow.    Osie Bond., M.A. [facility] Pager [phone] Office [phone]  07/13/2019, 8:08 AM

## 2019-07-14 ENCOUNTER — Inpatient Hospital Stay (HOSPITAL_COMMUNITY): Payer: Medicare Other

## 2019-07-14 DIAGNOSIS — I611 Nontraumatic intracerebral hemorrhage in hemisphere, cortical: Secondary | ICD-10-CM

## 2019-07-14 LAB — CBC
HCT: 27.5 % — ABNORMAL LOW (ref 39.0–52.0)
Hemoglobin: 11.8 g/dL — ABNORMAL LOW (ref 13.0–17.0)
MCH: 45.6 pg — ABNORMAL HIGH (ref 26.0–34.0)
MCHC: 37.1 g/dL — ABNORMAL HIGH (ref 30.0–36.0)
MCV: 106.2 fL — ABNORMAL HIGH (ref 80.0–100.0)
Platelets: 153 10*3/uL (ref 150–400)
RBC: 2.59 MIL/uL — ABNORMAL LOW (ref 4.22–5.81)
RDW: 15.9 % — ABNORMAL HIGH (ref 11.5–15.5)
WBC: 10.8 10*3/uL — ABNORMAL HIGH (ref 4.0–10.5)
nRBC: 0.9 % — ABNORMAL HIGH (ref 0.0–0.2)

## 2019-07-14 LAB — BASIC METABOLIC PANEL
BUN: 29 mg/dL — ABNORMAL HIGH (ref 8–23)
CO2: 20 mmol/L — ABNORMAL LOW (ref 22–32)
Calcium: 8.1 mg/dL — ABNORMAL LOW (ref 8.9–10.3)
Chloride: >130 mmol/L (ref 98–111)
Creatinine, Ser: 2.12 mg/dL — ABNORMAL HIGH (ref 0.61–1.24)
GFR calc Af Amer: 34 mL/min — ABNORMAL LOW (ref >60)
GFR calc non Af Amer: 29 mL/min — ABNORMAL LOW (ref >60)
Glucose, Bld: 145 mg/dL — ABNORMAL HIGH (ref 70–99)
Potassium: 4 mmol/L (ref 3.5–5.1)
Sodium: 160 mmol/L — ABNORMAL HIGH (ref 135–145)

## 2019-07-14 LAB — GLUCOSE, CAPILLARY
Glucose-Capillary: 124 mg/dL — ABNORMAL HIGH (ref 70–99)
Glucose-Capillary: 139 mg/dL — ABNORMAL HIGH (ref 70–99)
Glucose-Capillary: 161 mg/dL — ABNORMAL HIGH (ref 70–99)
Glucose-Capillary: 117 mg/dL — ABNORMAL HIGH (ref 70–99)
Glucose-Capillary: 113 mg/dL — ABNORMAL HIGH (ref 70–99)
Glucose-Capillary: 112 mg/dL — ABNORMAL HIGH (ref 70–99)

## 2019-07-14 LAB — PHOSPHORUS: Phosphorus: 3.4 mg/dL (ref 2.5–4.6)

## 2019-07-14 LAB — MAGNESIUM: Magnesium: 1.7 mg/dL (ref 1.7–2.4)

## 2019-07-14 LAB — SODIUM
Sodium: 161 mmol/L (ref 135–145)
Sodium: 162 mmol/L (ref 135–145)

## 2019-07-14 NOTE — Progress Notes (Signed)
CRITICAL VALUE ALERT  Critical Value:  Cl >130  Date & Time Notied: 07/14/2019 1100  Provider Notified: Frederik Pear, CCM  Orders Received/Actions taken: Stop Normal Saline infusion

## 2019-07-14 NOTE — Progress Notes (Signed)
Spoke with lab in regards to missing lab results from 2330 03/17.  I personally collected and sent the NA specimen around 2330, soon after I sent the specimen,  I received a call from lab stating a NA was due, I informed them at that time I had just sent it via tube station.   There are currently no lab results form that specimen, the lab technician stated "the tube must have ate it".

## 2019-07-14 NOTE — Progress Notes (Signed)
STROKE TEAM PROGRESS NOTE   INTERVAL HISTORY RN at bedside. On cleviprex and propofol.  Pt still intubated, barely open eyes on pain, but purposefully moving left UE and LE, eyes shut to against forced opening. However, CT showed increased cerebral edema and MLS. On 3% saline on hold currently due to Na 162, monitoring.      Vitals:   07/14/19 0530 07/14/19 0545 07/14/19 0600 07/14/19 0615  BP: 133/70 135/68 131/69 135/71  Pulse: 87 91 91 87  Resp: 13 13 14 12   Temp:      TempSrc:      SpO2: 100% 100% 100% 100%  Weight:       CBC:  Recent Labs  Lab 07/22/2019 1957 07/08/2019 2005 07/12/19 1014 07/13/19 0445  WBC 7.8   < > 8.3 9.3  NEUTROABS 5.1  --   --   --   HGB 10.7*   < > 10.5* 9.7*  HCT 33.0*   < > 31.5* 30.5*  MCV 100.3*   < > 98.1 104.8*  PLT 192   < > 183 166   < > = values in this interval not displayed.   Basic Metabolic Panel:  Recent Labs  Lab 07/12/19 1014 07/12/19 1808 07/13/19 0445 07/13/19 0445 07/13/19 1015 07/13/19 1742  NA 147*   < > 158*   < > 163* 162*  K 4.9  --  4.5  --   --   --   CL 114*  --  126*  --   --   --   CO2 19*  --  22  --   --   --   GLUCOSE 125*  --  145*  --   --   --   BUN 20  --  18  --   --   --   CREATININE 1.90*  --  1.96*  --   --   --   CALCIUM 8.7*  --  8.2*  --   --   --    < > = values in this interval not displayed.   Lipid Panel:     Component Value Date/Time   CHOL 117 07/13/2019 0445   CHOL 133 02/17/2017 1052   TRIG 236 (H) 07/13/2019 0445   HDL 27 (L) 07/13/2019 0445   HDL 46 02/17/2017 1052   CHOLHDL 4.3 07/13/2019 0445   VLDL 47 (H) 07/13/2019 0445   LDLCALC 43 07/13/2019 0445   LDLCALC 72 02/17/2017 1052   HgbA1c:  Lab Results  Component Value Date   HGBA1C 5.8 (H) 07/13/2019   Urine Drug Screen:     Component Value Date/Time   LABOPIA NONE DETECTED 07/12/2019 0004   COCAINSCRNUR NONE DETECTED 07/12/2019 0004   LABBENZ POSITIVE (A) 07/12/2019 0004   AMPHETMU NONE DETECTED 07/12/2019 0004   THCU NONE DETECTED 07/12/2019 0004   LABBARB POSITIVE (A) 07/12/2019 0004    Alcohol Level     Component Value Date/Time   ETH <10 07/08/2019 1957    IMAGING past 24 hours No results found.  PHYSICAL EXAM   Temp:  [98.2 F (36.8 C)-99.5 F (37.5 C)] 98.2 F (36.8 C) (03/18 0400) Pulse Rate:  [44-110] 87 (03/18 0615) Resp:  [12-116] 12 (03/18 0615) BP: (99-158)/(53-82) 135/71 (03/18 0615) SpO2:  [99 %-100 %] 100 % (03/18 0615) FiO2 (%):  [40 %] 40 % (03/18 0335)  General - Well nourished, well developed, intubated on sedation.  Ophthalmologic - fundi not visualized due to noncooperation.  Cardiovascular -  Regular rate and rhythm.  Neuro - intubated on sedation, eyes closed, not following commands. Midline conjugate gaze, not blinking to visual threat, doll's eyes present, not tracking, pupils pinpoint and sluggish. Cannot test corneals due to non-cooperation he resists eye opening. , gag and cough present. Breathing over the vent.  Facial symmetry not able to test due to ET tube.  Tongue protrusion not cooperative. On pain stimulation, spontaneous purposeful movement of LUE able to against gravity as well as LLE, however no movement of RUE and RLE, dense hemiplegia. DTR diminished and toes equivocal. Sensation, coordination and gait not tested.   ASSESSMENT/PLAN [name] is a 79 y.o. male with history of hypertension,MGUS, essential tremor, mental retardation who was found unresponsive, bagged and paced by EMS, presenting non-verbal with R sided hemiplegia.   Stroke:  large L temporoparietal hemorrhage w/ IVH and SAH likely secondary to hypertensive vs underlying structural lesion   CT head large L superior L temporal lobe and L temporoparietal  jxn IPH w/ marked mass effect w/ partial effacement L lateral and 3rd ventricles. 25mm R midline shift, small IVH L ventricle. Probable small SAH.   CTA head L parietal IPH same w/ worsening edema, stable midline shift.  Anterior displacement L MCA branches d/t mass effect.   Repeat CT 3/17 extensive L cerebral hemorrhage and 58mm midline shift   Consider MRI with and without contrast if Cre improves   2D Echo EF 65-70%. No source of embolus   LDL 43  HgbA1c 5.8  SCDs for VTE prophylaxis  aspirin 81 mg daily prior to admission, now on No antithrombotic given hemorrhage   Therapy recommendations: signed off, reconsult when pt able to participate  Disposition:  pending   Cytotoxic Cerebral Edema Induced Hypernatremia  NSG consulted (jones) no role for surgical intervention  CT head midline shift 11 mm, out of proportion for fresh ICH, concerning for underlying structural lesion  Consider MRI with and without contrast if Cre improves   On 3% hypertonic saline protocol  Has PICC  Na 140->143->150->152->158 ->163->162  Goal Na 150-155  Check Na q6h  Acute Respiratory Failure   secondary to stroke  Intubated for airway protection  Sedated   CCM onboard  Hypertensivemergency  BP as high as 188/88 on arrival  Home meds:  norvasc 5  Taper off cleviprex gtt as able  Resume home norvasc and propranolol . SBP goal < 160  . Long-term BP goal normotensive  Dysphagia . Secondary to stroke . NPO . Speech on board . On trickle feeds now @ 20   Other Stroke Risk Factors  Advanced age  Former Cigarette smoker, quit 31 yrs ago  Other Active Problems  Mental retardation   Essential tremor on mysoline, propranolol  MGUS  ? Chronic LUL pneumatocele, OP CT recommended  Anemia of chronic dz 10.9->9.9->10.5->9.7  CKD IIIb, Cre 2.18->2.2->1.9->1.96  Hospital day # 3  This patient is critically ill due to large left temporal ICH, IVH with midline shift, hypertensive emergency and at significant risk of neurological worsening, death form hematoma expansion, hydrocephalus, cerebral edema, brain herniation, seizure. This patient's care requires constant monitoring of vital  signs, hemodynamics, respiratory and cardiac monitoring, review of multiple databases, neurological assessment, discussion with family, other specialists and medical decision making of high complexity. I spent 30 minutes of neurocritical care time in the care of this patient. I discussed with CCM Dr. Judieth Keens, MD Stroke Neurology 07/14/2019 8:09 AM  To contact Stroke Continuity  provider, please refer to [email or web address] After hours, contact General Neurology

## 2019-07-14 NOTE — Progress Notes (Signed)
NAME:  Jared Woodward, MRN:  [id number], DOB:  1941/02/03, LOS: 3 ADMISSION DATE:  07/10/2019, CONSULTATION DATE:  07/14/2019 REFERRING MD: Aroor, CHIEF COMPLAINT:  AMS  Brief History   79 year old man with hx of HTN, anemia of chronic disease, HLD, cognitive impairment, CKD stage IV, found unresponsive by family in bathroom after dinner admitted with acute respiratory failure, hypertensive crisis >> found to have [place].   History of present illness   Arrival to ED, R sided hemiplegia.   Awake but non verbal on initial arrival (per neuro note) Intubated for airway protection.   Past Medical History  HTN Glaucoma Amaurosis fugax HLD,  ACD CKD IV Cognitive impairment MGUS  Essential tremor  Significant Hospital Events    Consults:  Neurology primary PCCM consult  Procedures:  LUE PICC 3/16 >   Significant Diagnostic Tests:  CXR 07/10/2019 > no acute process.  Chronic mild hyperinflation and thick-walled peripherally calcified pneumatocele in the left upper lobe, which could be infectious,inflammatory or malignant. Recommend outpatient follow up. CT angio 07/12/19 > Unchanged size of large left parietal lobe IPH with slight worsening of surrounding edema and unchanged 11 mm rightward midline shift. No aneurysm or vascular malformation.  Anterior displacement of the left MCA branches due to mass effect from the intraparenchymal hematoma. Echo 3/16 > EF 65-70%, G1DD.  Micro Data:   Antimicrobials:    Interim history/subjective:  + 1.6 L I/O, Net + 2.4 L I/O.  Afebrile CXR reviewed Labs pending this am  Objective   Blood pressure 135/71, pulse 87, temperature 98.2 F (36.8 C), temperature source Axillary, resp. rate 12, weight 55 kg, SpO2 100 %.    Vent Mode: PRVC FiO2 (%):  [40 %] 40 % Set Rate:  [12 bmp] 12 bmp Vt Set:  [580 mL] 580 mL PEEP:  [5 cmH20] 5 cmH20 Pressure Support:  [12 cmH20] 12 cmH20 Plateau Pressure:  [19 cmH20] 19 cmH20   Intake/Output Summary (Last  24 hours) at 07/14/2019 0838 Last data filed at 07/14/2019 0600 Gross per 24 hour  Intake 2199.65 ml  Output 725 ml  Net 1474.65 ml   Filed Weights   07/14/2019 2331  Weight: 55 kg    Examination: General: adult male, intubated.  HENT: [place]/AT.  ETT Lungs: Diminished at bases. + secretions via ETT.  Cardiovascular: SR on telemetry. No edema. Warm and well perfused. . Abdomen: soft, non tender, non distended.  Neuro: On 20 mcg/kg/min propofol: non eye opening, + cough/gag. PERRL, pinpoint.  Withdraws on the left side with stimulation.   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  felt to be 2/2 HTN vs underlying structural lesion. Plan:  - Secondary stroke work up per Neuro - Goal Na 150-155.  Q6 hour Na levels. 3 % currently on hold  Hypertensive emergency. - Goal SBP < 140 - Continue norvasc & metoprolol. Norvasc has been increased this am  - Cleviprex as needed.   Intubation for airway protection - 2/2 above. Plan - Continue full vent support with lung protective strategies, neurological exam excludes SBT/Extubation.   CKD, III - Baseline Cr: Plan: - Continue supportive care and minimize nephrotoxic agents as able  ? Chronic LUL pneumatocele. - Outpatient CT if neurological recovery.  Essential tremor - Home meds: Mysoline and propranolol (currently on hold)  Best practice:  Diet: Tube feeds. Pain/Anxiety/Delirium protocol (if indicated): on propofol  VAP protocol (if indicated): yes  DVT prophylaxis: held, SCDs GI prophylaxis: protonix Glucose control: < 160 on labs.  No SSI  Mobility: bed Code Status: Full  Family Communication: Per primary. Disposition: ICU  CC time: [place], ACNP [facility]

## 2019-07-15 ENCOUNTER — Inpatient Hospital Stay (HOSPITAL_COMMUNITY): Payer: Medicare Other

## 2019-07-15 LAB — CBC
HCT: 26.2 % — ABNORMAL LOW (ref 39.0–52.0)
HCT: 24.9 % — ABNORMAL LOW (ref 39.0–52.0)
Hemoglobin: 8.6 g/dL — ABNORMAL LOW (ref 13.0–17.0)
Hemoglobin: 7.7 g/dL — ABNORMAL LOW (ref 13.0–17.0)
MCH: 34.1 pg — ABNORMAL HIGH (ref 26.0–34.0)
MCH: 32.5 pg (ref 26.0–34.0)
MCHC: 32.8 g/dL (ref 30.0–36.0)
MCHC: 30.9 g/dL (ref 30.0–36.0)
MCV: 104 fL — ABNORMAL HIGH (ref 80.0–100.0)
MCV: 105.1 fL — ABNORMAL HIGH (ref 80.0–100.0)
Platelets: 168 10*3/uL (ref 150–400)
Platelets: ADEQUATE 10*3/uL (ref 150–400)
RBC: 2.52 MIL/uL — ABNORMAL LOW (ref 4.22–5.81)
RBC: 2.37 MIL/uL — ABNORMAL LOW (ref 4.22–5.81)
RDW: 15.6 % — ABNORMAL HIGH (ref 11.5–15.5)
RDW: 15.8 % — ABNORMAL HIGH (ref 11.5–15.5)
WBC: 5.3 10*3/uL (ref 4.0–10.5)
WBC: 4.6 10*3/uL (ref 4.0–10.5)
nRBC: 0.4 % — ABNORMAL HIGH (ref 0.0–0.2)
nRBC: 0.4 % — ABNORMAL HIGH (ref 0.0–0.2)

## 2019-07-15 LAB — BASIC METABOLIC PANEL
BUN: 46 mg/dL — ABNORMAL HIGH (ref 8–23)
CO2: 23 mmol/L (ref 22–32)
Calcium: 8.2 mg/dL — ABNORMAL LOW (ref 8.9–10.3)
Chloride: >130 mmol/L (ref 98–111)
Creatinine, Ser: 2.18 mg/dL — ABNORMAL HIGH (ref 0.61–1.24)
GFR calc Af Amer: 33 mL/min — ABNORMAL LOW (ref >60)
GFR calc non Af Amer: 28 mL/min — ABNORMAL LOW (ref >60)
Glucose, Bld: 132 mg/dL — ABNORMAL HIGH (ref 70–99)
Potassium: 4 mmol/L (ref 3.5–5.1)
Sodium: 162 mmol/L (ref 135–145)

## 2019-07-15 LAB — URINALYSIS, ROUTINE W REFLEX MICROSCOPIC
Bilirubin Urine: NEGATIVE
Glucose, UA: NEGATIVE mg/dL
Hgb urine dipstick: NEGATIVE
Ketones, ur: NEGATIVE mg/dL
Leukocytes,Ua: NEGATIVE
Nitrite: NEGATIVE
Protein, ur: 30 mg/dL — AB
Specific Gravity, Urine: 1.018 (ref 1.005–1.030)
pH: 5 (ref 5.0–8.0)

## 2019-07-15 LAB — GLUCOSE, CAPILLARY
Glucose-Capillary: 102 mg/dL — ABNORMAL HIGH (ref 70–99)
Glucose-Capillary: 110 mg/dL — ABNORMAL HIGH (ref 70–99)
Glucose-Capillary: 150 mg/dL — ABNORMAL HIGH (ref 70–99)
Glucose-Capillary: 107 mg/dL — ABNORMAL HIGH (ref 70–99)
Glucose-Capillary: 104 mg/dL — ABNORMAL HIGH (ref 70–99)
Glucose-Capillary: 179 mg/dL — ABNORMAL HIGH (ref 70–99)

## 2019-07-15 LAB — MAGNESIUM: Magnesium: 1.9 mg/dL (ref 1.7–2.4)

## 2019-07-15 LAB — POCT I-STAT 7, (LYTES, BLD GAS, ICA,H+H)
Bicarbonate: 24.4 mmol/L (ref 20.0–28.0)
Calcium, Ion: 1.3 mmol/L (ref 1.15–1.40)
HCT: 27 % — ABNORMAL LOW (ref 39.0–52.0)
Hemoglobin: 9.2 g/dL — ABNORMAL LOW (ref 13.0–17.0)
O2 Saturation: 100 %
Potassium: 3.6 mmol/L (ref 3.5–5.1)
Sodium: 163 mmol/L (ref 135–145)
TCO2: 26 mmol/L (ref 22–32)
pCO2 arterial: 39.4 mmHg (ref 32.0–48.0)
pH, Arterial: 7.4 (ref 7.350–7.450)
pO2, Arterial: 262 mmHg — ABNORMAL HIGH (ref 83.0–108.0)

## 2019-07-15 LAB — SODIUM
Sodium: 163 mmol/L (ref 135–145)
Sodium: 162 mmol/L (ref 135–145)
Sodium: 161 mmol/L (ref 135–145)

## 2019-07-15 LAB — PHOSPHORUS: Phosphorus: 3.8 mg/dL (ref 2.5–4.6)

## 2019-07-15 LAB — TRIGLYCERIDES: Triglycerides: 133 mg/dL (ref <150)

## 2019-07-15 MED ORDER — MIDAZOLAM HCL 2 MG/2ML IJ SOLN: 2.0000 mg | INTRAMUSCULAR | Status: DC | PRN

## 2019-07-15 MED ORDER — SODIUM CHLORIDE 0.9 % IV SOLN
2.0000 g | INTRAVENOUS | Status: DC
  Administered 2019-07-15 – 2019-07-16 (×2): 2 g via INTRAVENOUS
  Filled 2019-07-15 (×2): qty 2

## 2019-07-15 MED ORDER — FREE WATER: 300.0000 mL | Freq: Four times a day (QID) | Status: DC

## 2019-07-15 MED ORDER — VANCOMYCIN HCL IN DEXTROSE 1-5 GM/200ML-% IV SOLN
1000.0000 mg | Freq: Once | INTRAVENOUS | Status: AC
  Administered 2019-07-15: 1000 mg via INTRAVENOUS
  Filled 2019-07-15: qty 200

## 2019-07-15 MED ORDER — FREE WATER
200.0000 mL | Freq: Four times a day (QID) | Status: DC
  Administered 2019-07-15 – 2019-07-16 (×4): 200 mL

## 2019-07-15 MED ORDER — VANCOMYCIN HCL 750 MG/150ML IV SOLN: 750.0000 mg | INTRAVENOUS | Status: DC

## 2019-07-15 MED ORDER — VANCOMYCIN HCL 750 MG/150ML IV SOLN
750.0000 mg | INTRAVENOUS | Status: DC
  Filled 2019-07-15: qty 150

## 2019-07-15 MED ORDER — FENTANYL CITRATE (PF) 100 MCG/2ML IJ SOLN
25.0000 ug | INTRAMUSCULAR | Status: DC | PRN
  Administered 2019-07-15: 50 ug via INTRAVENOUS
  Filled 2019-07-15 (×2): qty 2

## 2019-07-15 MED ORDER — FENTANYL CITRATE (PF) 100 MCG/2ML IJ SOLN
25.0000 ug | INTRAMUSCULAR | Status: DC | PRN
  Administered 2019-07-15: 25 ug via INTRAVENOUS

## 2019-07-15 NOTE — Progress Notes (Signed)
NAME:  Jared Woodward, MRN:  [id number], DOB:  1940-10-04, LOS: 4 ADMISSION DATE:  07/10/2019, CONSULTATION DATE:  07/10/2019 REFERRING MD: Aroor, CHIEF COMPLAINT:  AMS  Brief History   79 year old man with hx of HTN, anemia of chronic disease, HLD, cognitive impairment, CKD stage IV, found unresponsive by family in bathroom after dinner admitted with acute respiratory failure, hypertensive crisis >> found to have [place].   History of present illness   Arrival to ED, R sided hemiplegia.   Awake but non verbal on initial arrival (per neuro note) Intubated for airway protection.   Past Medical History  HTN Glaucoma Amaurosis fugax HLD,  ACD CKD IV Cognitive impairment MGUS  Essential tremor  Significant Hospital Events    Consults:  Neurology primary PCCM consult  Procedures:  LUE PICC 3/16 >   Significant Diagnostic Tests:  CXR 07/14/2019 > no acute process.  Chronic mild hyperinflation and thick-walled peripherally calcified pneumatocele in the left upper lobe, which could be infectious,inflammatory or malignant. Recommend outpatient follow up. CT angio 07/12/19 > Unchanged size of large left parietal lobe IPH with slight worsening of surrounding edema and unchanged 11 mm rightward midline shift. No aneurysm or vascular malformation.  Anterior displacement of the left MCA branches due to mass effect from the intraparenchymal hematoma. Echo 3/16 > EF 65-70%, G1DD.  Micro Data:   Antimicrobials:    Interim history/subjective:  + 1 L I/O, Net + 3.5 L I/O Febrile this am, 101.1 Hemodynamics stable, Cleviprex weaned off yesterday afternoon.  Propofol weaned off yesterday  Objective   Blood pressure (!) 142/66, pulse 76, temperature (!) 101.1 F (38.4 C), temperature source Oral, resp. rate 15, height 5\' 8"  (1.727 m), weight 55.4 kg, SpO2 100 %.    Vent Mode: PRVC FiO2 (%):  [40 %] 40 % Set Rate:  [12 bmp] 12 bmp Vt Set:  [580 mL] 580 mL PEEP:  [5 cmH20] 5 cmH20 Pressure  Support:  [10 cmH20] 10 cmH20 Plateau Pressure:  [18 cmH20-23 cmH20] 18 cmH20   Intake/Output Summary (Last 24 hours) at 07/15/2019 0810 Last data filed at 07/15/2019 0600 Gross per 24 hour  Intake 1143.78 ml  Output 300 ml  Net 843.78 ml   Filed Weights   06/30/2019 2331 07/15/19 0500  Weight: 55 kg 55.4 kg    Examination: General: adult male, intubated.  HENT: [place]/AT.  ETT Lungs: Diminished at bases. + secretions via ETT, purulent  Cardiovascular: SR on telemetry. No edema. Warm and well perfused. . Abdomen: soft, non tender, non distended.  Neuro: non eye opening, + cough/gag. PERRL, pinpoint.  Withdraws on the left side with stimulation. Right side flaccid. Does not follow commands.   Assessment & Plan:  Fever -Suspected source: Tracheobronchitis vs pneumonia.  -CXR pending -UA pending -Abx: Vanc/Cefepime started empirically.   -MRSA Nares pending for rapid deescalation of abx.   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  felt to be 2/2 HTN vs underlying structural lesion. Plan:  - Secondary stroke work up per Neuro - Goal Na 150-155.  Q6 hour Na levels. 3 % currently on hold since 3/17.  Remains hypernatremic, follow up 1200 Na.   Hypertensive emergency. - Goal SBP < 140 - Controlled with norvasc & metoprolol  Intubation for airway protection - 2/2 above. Plan - Continue full vent support with lung protective strategies, neurological exam and purulent secretions  excludes extubation.   CKD, III - Baseline Cr:  2.1-2.4 in 2020 Plan: - Continue supportive care and minimize nephrotoxic agents as able  ? Chronic LUL pneumatocele. - Outpatient CT if neurological recovery.  Essential tremor - Home meds: Mysoline and propranolol (currently on hold)  Best practice:  Diet: Tube feeds. Pain/Anxiety/Delirium protocol (if indicated):PRN available VAP protocol (if indicated): yes  DVT prophylaxis: held, SCDs GI  prophylaxis: protonix Glucose control: < 160 on labs.  No SSI  Mobility: bed Code Status: Full  Family Communication: Per primary. Disposition: ICU  CC time: [place], ACNP [facility]

## 2019-07-15 NOTE — Progress Notes (Signed)
SLP Cancellation Note  Patient Details Name: [name] MRN: [id number] DOB: 06/08/1940   Cancelled treatment:       Reason Eval/Treat Not Completed: Medical issues which prohibited therapy. Pt remains ventilated, sedated. Will sign off for cognitive evaluation for now. Please reorder when ready.   Osie Bond., M.A. [facility] Pager [phone] Office [phone]  Talbert Nan 07/15/2019, 8:13 AM

## 2019-07-15 NOTE — Progress Notes (Signed)
Pt back on full vent support due to decreased minute ventilation.

## 2019-07-15 NOTE — Progress Notes (Signed)
STROKE TEAM PROGRESS NOTE   INTERVAL HISTORY RN at bedside. No family at bedside. Neuro stable. On cleviprex and propofol.  Pt still intubated, barely open eyes on pain, but purposefully moving left UE and LE, eyes shut to against forced opening. However, CT showed increased cerebral edema and MLS. On 3% saline on hold currently due to Na 163 this morning, monitoring. Repeat CT in the morning.  Vitals:   07/15/19 0500 07/15/19 0600 07/15/19 0700 07/15/19 0800  BP: 125/66 129/67 (!) 142/66   Pulse: 76 67 76   Resp: 13 14 15    Temp:    98.2 F (36.8 C)  TempSrc:    Axillary  SpO2: 100% 100% 100%   Weight: 55.4 kg     Height:       CBC:  Recent Labs  Lab 07/27/2019 1957 07/04/2019 2005 07/14/19 1433 07/15/19 0532  WBC 7.8   < > 10.8* 5.3  NEUTROABS 5.1  --   --   --   HGB 10.7*   < > 11.8* 8.6*  HCT 33.0*   < > 27.5* 26.2*  MCV 100.3*   < > 106.2* 104.0*  PLT 192   < > 153 168   < > = values in this interval not displayed.   Basic Metabolic Panel:  Recent Labs  Lab 07/14/19 0916 07/14/19 1433 07/15/19 0144 07/15/19 0532  NA 160*   < > 163* 162*  K 4.0  --   --  4.0  CL >130*  --   --  >130*  CO2 20*  --   --  23  GLUCOSE 145*  --   --  132*  BUN 29*  --   --  46*  CREATININE 2.12*  --   --  2.18*  CALCIUM 8.1*  --   --  8.2*  MG 1.7  --   --  1.9  PHOS 3.4  --   --  3.8   < > = values in this interval not displayed.   Lipid Panel:     Component Value Date/Time   CHOL 117 07/13/2019 0445   CHOL 133 02/17/2017 1052   TRIG 133 07/15/2019 0144   HDL 27 (L) 07/13/2019 0445   HDL 46 02/17/2017 1052   CHOLHDL 4.3 07/13/2019 0445   VLDL 47 (H) 07/13/2019 0445   LDLCALC 43 07/13/2019 0445   LDLCALC 72 02/17/2017 1052   HgbA1c:  Lab Results  Component Value Date   HGBA1C 5.8 (H) 07/13/2019   Urine Drug Screen:     Component Value Date/Time   LABOPIA NONE DETECTED 07/12/2019 0004   COCAINSCRNUR NONE DETECTED 07/12/2019 0004   LABBENZ POSITIVE (A) 07/12/2019  0004   AMPHETMU NONE DETECTED 07/12/2019 0004   THCU NONE DETECTED 07/12/2019 0004   LABBARB POSITIVE (A) 07/12/2019 0004    Alcohol Level     Component Value Date/Time   ETH <10 07/21/2019 1957    IMAGING past 24 hours No results found.  PHYSICAL EXAM   Temp:  [98.2 F (36.8 C)-101.1 F (38.4 C)] 98.2 F (36.8 C) (03/19 0800) Pulse Rate:  [43-118] 76 (03/19 0700) Resp:  [12-26] 15 (03/19 0700) BP: (98-155)/(55-87) 142/66 (03/19 0700) SpO2:  [100 %] 100 % (03/19 0700) FiO2 (%):  [40 %] 40 % (03/19 0916) Weight:  [55.4 kg] 55.4 kg (03/19 0500)  General - Well nourished, well developed, intubated on sedation.  Ophthalmologic - fundi not visualized due to noncooperation.  Cardiovascular - Regular rate and  rhythm.  Neuro - intubated on sedation, eyes closed, not following commands. Midline conjugate gaze, not blinking to visual threat, doll's eyes present, not tracking, pupils pinpoint and sluggish. Cannot test corneals due to non-cooperation he resists eye opening. , gag and cough present. Breathing over the vent.  Facial symmetry not able to test due to ET tube.  Tongue protrusion not cooperative. On pain stimulation, spontaneous purposeful movement of LUE able to against gravity as well as LLE, 1/5 movement of RUE and RLE on pain. DTR diminished and toes equivocal. Sensation, coordination and gait not tested.   ASSESSMENT/PLAN [name] is a 79 y.o. male with history of hypertension,MGUS, essential tremor, mental retardation who was found unresponsive, bagged and paced by EMS, presenting non-verbal with R sided hemiplegia.   Stroke:  large L temporoparietal hemorrhage w/ IVH and SAH likely secondary to hypertensive vs underlying structural lesion   CT head large L superior L temporal lobe and L temporoparietal  jxn IPH w/ marked mass effect w/ partial effacement L lateral and 3rd ventricles. 6mm R midline shift, small IVH L ventricle. Probable small SAH.   CTA  head L parietal IPH same w/ worsening edema, stable midline shift. Anterior displacement L MCA branches d/t mass effect.   Repeat CT 3/17 extensive L cerebral hemorrhage and 84mm midline shift   Consider MRI with and without contrast if Cre improves   Repeat CT in the morning 07/16/2019  2D Echo EF 65-70%. No source of embolus   LDL 43  HgbA1c 5.8  SCDs for VTE prophylaxis  aspirin 81 mg daily prior to admission, now on No antithrombotic given hemorrhage   Therapy recommendations: signed off, reconsult when pt able to participate  Disposition:  pending   Cytotoxic Cerebral Edema Induced Hypernatremia  NSG consulted (jones) no role for surgical intervention  CT head midline shift 11 mm, out of proportion for fresh ICH, concerning for underlying structural lesion  Consider MRI with and without contrast if Cre improves   Repeat CT Head morning 07/16/2019  On 3% hypertonic saline protocol  Has PICC  Na 140->143->150->152->158 ->163->162  Goal Na 150-155  Check Na q6h  Acute Respiratory Failure   secondary to stroke  Intubated for airway protection  Sedated   CCM onboard  Hypertensivemergency  BP as high as 188/88 on arrival  Home meds:  norvasc 5  Taper off cleviprex gtt as able  Resume home norvasc and propranolol . SBP goal < 160  . Long-term BP goal normotensive  Dysphagia . Secondary to stroke . NPO . Speech on board . On trickle feeds now @ 20   Other Stroke Risk Factors  Advanced age  Former Cigarette smoker, quit 31 yrs ago  Other Active Problems  Mental retardation   Essential tremor on mysoline, propranolol(currently on hold)  MGUS  ? Chronic LUL pneumatocele, OP CT recommended  Anemia of chronic dz 10.9->9.9->10.5->9.7-11.8-8.6  CKD IIIb, Cre 2.18->2.2->1.9->1.96-2.18  Febrile 101.1, infectious workup pending (07/15/2019), Abx started empirically  Hospital day # 4  This patient is critically ill due to large left  temporal ICH, IVH with midline shift, hypertensive emergency and at significant risk of neurological worsening, death form hematoma expansion, hydrocephalus, cerebral edema, brain herniation, seizure. This patient's care requires constant monitoring of vital signs, hemodynamics, respiratory and cardiac monitoring, review of multiple databases, neurological assessment, discussion with family, other specialists and medical decision making of high complexity. I spent 30 minutes of neurocritical care time in the care of this  patient. I discussed with CCM Dr. Judieth Keens, MD Stroke Neurology 07/15/2019 9:34 AM  To contact Stroke Continuity provider, please refer to [email or web address] After hours, contact General Neurology

## 2019-07-15 NOTE — Procedures (Signed)
Endotracheal Tube Exchange Procedure Note Jared Woodward [id number] [birth year]  Procedure: Intubation Indications: Respiratory insufficiency  Procedure Details Consent: Unable to obtain consent because of emergent medical necessity. Time Out: Verified patient identification, verified procedure, site/side was marked, verified correct patient position, special equipment/implants available, medications/allergies/relevent history reviewed, required imaging and test results available.  Performed  Maximum sterile technique was used including gloves, hand hygiene and mask.  3  Patient given Fentanyl 50 mcg prior to procedure. MAC 3 visualized ETT between vocal cords Bougie passed through ETT and ETT removed. Replaced with 7.5 ETT. Color change present on CO2 detector. Patient connected to vent with appropriate inspired and expired tidal volumes  Evaluation Hemodynamic Status: BP stable throughout; O2 sats: stable throughout Patient's Current Condition: stable Complications: No apparent complications Patient did tolerate procedure well. Chest X-ray ordered to verify placement.  CXR: pending.   Jared Woodward Rodman Pickle 07/15/2019

## 2019-07-15 NOTE — Progress Notes (Signed)
Pharmacy Antibiotic Note  Jared Woodward is a 79 y.o. male admitted on 06/29/2019 with pneumonia.  Pharmacy has been consulted for vancomycin and cefepime dosing.  Patient is now back on full vent support with fevers with suspected pneumoniae. WBC today is 5.3, Tmax 101.5. Renal function is poor, but stable with CrCl ~22 ml/min.   Plan: - Start cefepime 2g IV q24hr - Give vancomycin loading dose 1g IV x1 followed by 750 mg IV q48hr, Scr used 2.18, Est AUC 407, Goal AUC 400-550 - Monitor renal function and vancomycin levels as indicated - F/u MRSA PCR, cultures and sensitivities for de-escalation  Height: 5\' 8"  (172.7 cm) Weight: 122 lb 2.2 oz (55.4 kg) IBW/kg (Calculated) : 68.4  Temp (24hrs), Avg:99.8 F (37.7 C), Min:98.2 F (36.8 C), Max:101.1 F (38.4 C)  Recent Labs  Lab 07/08/2019 1957 07/21/2019 1957 06/27/2019 2005 07/12/19 1014 07/13/19 0445 07/14/19 0916 07/14/19 1433 07/15/19 0532  WBC 7.8  --   --  8.3 9.3  --  10.8* 5.3  CREATININE 2.18*   < > 2.20* 1.90* 1.96* 2.12*  --  2.18*   < > = values in this interval not displayed.    Estimated Creatinine Clearance: 22.6 mL/min (A) (by C-G formula based on SCr of 2.18 mg/dL (H)).    Allergies  Allergen Reactions  . Lisinopril Cough    Antimicrobials this admission: Vancomycin 3/19 >>  Cefepime 3/19 >>   Dose adjustments this admission:  Microbiology results:  3/19 Sputum: sent  3/19 MRSA PCR: sent  Thank you for allowing pharmacy to be a part of this patient's care.  Agnes Lawrence, PharmD PGY1 Pharmacy Resident

## 2019-07-16 ENCOUNTER — Inpatient Hospital Stay (HOSPITAL_COMMUNITY): Payer: Medicare Other

## 2019-07-16 DIAGNOSIS — R509 Fever, unspecified: Secondary | ICD-10-CM

## 2019-07-16 DIAGNOSIS — J9601 Acute respiratory failure with hypoxia: Secondary | ICD-10-CM

## 2019-07-16 DIAGNOSIS — J9602 Acute respiratory failure with hypercapnia: Secondary | ICD-10-CM

## 2019-07-16 LAB — BASIC METABOLIC PANEL
Anion gap: 11 (ref 5–15)
BUN: 56 mg/dL — ABNORMAL HIGH (ref 8–23)
CO2: 24 mmol/L (ref 22–32)
Calcium: 8.5 mg/dL — ABNORMAL LOW (ref 8.9–10.3)
Chloride: 126 mmol/L — ABNORMAL HIGH (ref 98–111)
Creatinine, Ser: 2 mg/dL — ABNORMAL HIGH (ref 0.61–1.24)
GFR calc Af Amer: 36 mL/min — ABNORMAL LOW (ref >60)
GFR calc non Af Amer: 31 mL/min — ABNORMAL LOW (ref >60)
Glucose, Bld: 129 mg/dL — ABNORMAL HIGH (ref 70–99)
Potassium: 3.6 mmol/L (ref 3.5–5.1)
Sodium: 161 mmol/L (ref 135–145)

## 2019-07-16 LAB — GLUCOSE, CAPILLARY
Glucose-Capillary: 105 mg/dL — ABNORMAL HIGH (ref 70–99)
Glucose-Capillary: 104 mg/dL — ABNORMAL HIGH (ref 70–99)
Glucose-Capillary: 134 mg/dL — ABNORMAL HIGH (ref 70–99)

## 2019-07-16 LAB — CBC
HCT: 28.4 % — ABNORMAL LOW (ref 39.0–52.0)
HCT: 29.1 % — ABNORMAL LOW (ref 39.0–52.0)
Hemoglobin: 9 g/dL — ABNORMAL LOW (ref 13.0–17.0)
Hemoglobin: 9.3 g/dL — ABNORMAL LOW (ref 13.0–17.0)
MCH: 32.6 pg (ref 26.0–34.0)
MCH: 32.5 pg (ref 26.0–34.0)
MCHC: 31.7 g/dL (ref 30.0–36.0)
MCHC: 32 g/dL (ref 30.0–36.0)
MCV: 102.9 fL — ABNORMAL HIGH (ref 80.0–100.0)
MCV: 101.7 fL — ABNORMAL HIGH (ref 80.0–100.0)
Platelets: 109 10*3/uL — ABNORMAL LOW (ref 150–400)
Platelets: 103 10*3/uL — ABNORMAL LOW (ref 150–400)
RBC: 2.76 MIL/uL — ABNORMAL LOW (ref 4.22–5.81)
RBC: 2.86 MIL/uL — ABNORMAL LOW (ref 4.22–5.81)
RDW: 15.8 % — ABNORMAL HIGH (ref 11.5–15.5)
RDW: 15.6 % — ABNORMAL HIGH (ref 11.5–15.5)
WBC: 5.5 10*3/uL (ref 4.0–10.5)
WBC: 6.5 10*3/uL (ref 4.0–10.5)
nRBC: 0 % (ref 0.0–0.2)
nRBC: 0 % (ref 0.0–0.2)

## 2019-07-16 LAB — PHOSPHORUS: Phosphorus: 4 mg/dL (ref 2.5–4.6)

## 2019-07-16 LAB — SODIUM: Sodium: 160 mmol/L — ABNORMAL HIGH (ref 135–145)

## 2019-07-16 LAB — MAGNESIUM: Magnesium: 2 mg/dL (ref 1.7–2.4)

## 2019-07-16 MED ORDER — GLYCOPYRROLATE 1 MG PO TABS
1.0000 mg | ORAL_TABLET | ORAL | Status: DC | PRN
  Filled 2019-07-16: qty 1

## 2019-07-16 MED ORDER — MORPHINE 100MG IN NS 100ML (1MG/ML) PREMIX INFUSION
1.0000 mg/h | INTRAVENOUS | Status: DC
  Administered 2019-07-16: 5 mg/h via INTRAVENOUS
  Administered 2019-07-17 (×3): 8 mg/h via INTRAVENOUS
  Filled 2019-07-16 (×3): qty 100

## 2019-07-16 MED ORDER — HALOPERIDOL 0.5 MG PO TABS
0.5000 mg | ORAL_TABLET | ORAL | Status: DC | PRN
  Filled 2019-07-16: qty 1

## 2019-07-16 MED ORDER — MORPHINE BOLUS VIA INFUSION
4.0000 mg | Freq: Once | INTRAVENOUS | Status: AC
  Administered 2019-07-16: 14:00:00 4 mg via INTRAVENOUS
  Filled 2019-07-16: qty 4

## 2019-07-16 MED ORDER — ACETAMINOPHEN 650 MG RE SUPP: 650.0000 mg | Freq: Four times a day (QID) | RECTAL | Status: DC | PRN

## 2019-07-16 MED ORDER — GLYCOPYRROLATE 0.2 MG/ML IJ SOLN
0.2000 mg | INTRAMUSCULAR | Status: DC | PRN
  Administered 2019-07-16: 0.2 mg via INTRAVENOUS
  Filled 2019-07-16: qty 1

## 2019-07-16 MED ORDER — ONDANSETRON 4 MG PO TBDP: 4.0000 mg | ORAL_TABLET | Freq: Four times a day (QID) | ORAL | Status: DC | PRN

## 2019-07-16 MED ORDER — VITAL HIGH PROTEIN PO LIQD: 1000.0000 mL | ORAL | Status: DC

## 2019-07-16 MED ORDER — GLYCOPYRROLATE 0.2 MG/ML IJ SOLN: 0.2000 mg | INTRAMUSCULAR | Status: DC | PRN

## 2019-07-16 MED ORDER — ONDANSETRON HCL 4 MG/2ML IJ SOLN: 4.0000 mg | Freq: Four times a day (QID) | INTRAMUSCULAR | Status: DC | PRN

## 2019-07-16 MED ORDER — BIOTENE DRY MOUTH MT LIQD: 15.0000 mL | OROMUCOSAL | Status: DC | PRN

## 2019-07-16 MED ORDER — ACETAMINOPHEN 325 MG PO TABS: 650.0000 mg | ORAL_TABLET | Freq: Four times a day (QID) | ORAL | Status: DC | PRN

## 2019-07-16 MED ORDER — SCOPOLAMINE 1 MG/3DAYS TD PT72
1.0000 | MEDICATED_PATCH | TRANSDERMAL | Status: DC
  Administered 2019-07-16: 14:00:00 1.5 mg via TRANSDERMAL
  Filled 2019-07-16: qty 1

## 2019-07-16 MED ORDER — POLYVINYL ALCOHOL 1.4 % OP SOLN
1.0000 [drp] | Freq: Four times a day (QID) | OPHTHALMIC | Status: DC | PRN
  Filled 2019-07-16: qty 15

## 2019-07-16 MED ORDER — HALOPERIDOL LACTATE 2 MG/ML PO CONC: 0.5000 mg | ORAL | Status: DC | PRN

## 2019-07-16 MED ORDER — HALOPERIDOL LACTATE 5 MG/ML IJ SOLN: 0.5000 mg | INTRAMUSCULAR | Status: DC | PRN

## 2019-07-16 NOTE — Progress Notes (Signed)
Patient transferred to 6N16. Family at bedside and 6N RN. Family has patient's belongings.

## 2019-07-16 NOTE — Progress Notes (Addendum)
NAME:  Jared Woodward, MRN:  [id number], DOB:  1941/04/11, LOS: 5 ADMISSION DATE:  07/20/2019, CONSULTATION DATE:  07/10/2019 REFERRING MD: Aroor, CHIEF COMPLAINT:  AMS  Brief History   79 year old man with hx of HTN, anemia of chronic disease, HLD, cognitive impairment, CKD stage IV, found unresponsive by family in bathroom after dinner admitted with acute respiratory failure, hypertensive crisis >> found to have L  [place].   History of present illness   Arrival to ED, R sided hemiplegia.   Awake but non verbal on initial arrival (per neuro note) Intubated for airway protection.   Past Medical History  HTN Glaucoma Amaurosis fugax HLD,  ACD CKD IV Cognitive impairment MGUS  Essential tremor  Significant Hospital Events    Consults:  Neurology primary PCCM consult  Procedures:  LUE PICC 3/16 >  ET  3/15, exchanged over Bougie 3/19   Significant Diagnostic Tests:  CXR 07/16/2019 > no acute process.  Chronic mild hyperinflation and thick-walled peripherally calcified pneumatocele in the left upper lobe  CT angio 07/12/19 > Unchanged size of large left parietal lobe IPH with slight worsening of surrounding edema and unchanged 11 mm rightward midline shift. No aneurysm or vascular malformation.  Anterior displacement of the left MCA branches due to mass effect from the intraparenchymal hematoma. Echo 3/16 > EF 65-70%, G1DD. CT heat 3/20 : Unchanged appearance of massive hematoma centered in the left hemisphere with worsening rightward midline shift, now measuring 14 mm, previously 11 mm.  Micro Data:  3/15 RVP  Neg for COVID 19 /flu 3/15 MRSA PCR  Neg 3/19  ET  Abundant wbc, gnr > e coli >>>  Antimicrobials:  Cefepime 3/19 >>> Vanc  3/19 only    Scheduled Meds: . amLODipine  10 mg Per Tube Daily  . chlorhexidine gluconate (MEDLINE KIT)  15 mL Mouth Rinse BID  . Chlorhexidine Gluconate Cloth  6 each Topical Daily  . feeding supplement (PRO-STAT SUGAR FREE 64)  30 mL Per  Tube TID  . feeding supplement (VITAL HIGH PROTEIN)  1,000 mL Per Tube Q24H  . free water  200 mL Per Tube Q6H  . mouth rinse  15 mL Mouth Rinse 10 times per day  . metoprolol tartrate  50 mg Per Tube BID  . pantoprazole sodium  40 mg Per Tube Daily  . propofol  30 mg Intravenous Once  . senna-docusate  1 tablet Per Tube BID   Continuous Infusions: . ceFEPime (MAXIPIME) IV Stopped (07/15/19 1051)  . clevidipine Stopped (07/14/19 1657)   PRN Meds:.acetaminophen **OR** acetaminophen (TYLENOL) oral liquid 160 mg/5 mL **OR** acetaminophen, fentaNYL (SUBLIMAZE) injection, fentaNYL (SUBLIMAZE) injection, midazolam, midazolam, sodium chloride flush  Interim history/subjective:  No longer req sedation/ no fever  Objective   Blood pressure (!) 145/81, pulse 75, temperature 99.3 F (37.4 C), temperature source Oral, resp. rate 13, height 5' 8"  (1.727 m), weight 55.4 kg, SpO2 100 %.    Vent Mode: PRVC FiO2 (%):  [40 %] 40 % Set Rate:  [12 bmp] 12 bmp Vt Set:  [580 mL] 580 mL PEEP:  [5 cmH20] 5 cmH20 Pressure Support:  [12 cmH20] 12 cmH20 Plateau Pressure:  [16 cmH20-19 cmH20] 16 cmH20   Intake/Output Summary (Last 24 hours) at 07/16/2019 1039 Last data filed at 07/16/2019 0504 Gross per 24 hour  Intake 1481.4 ml  Output 875 ml  Net 606.4 ml   Filed Weights   07/26/2019 2331 07/15/19 0500 07/16/19 0500  Weight: 55 kg 55.4  kg 55.4 kg    Examination:  Acutely ill on vent/et in place/ og also No jvd  Neck supple Lungs with distant bs  bilaterally RRR no s3 or or sign murmur Abd soft, tol tf    Extr warm with no edema or clubbing noted - pas in place  Neuro  No resp to verbal, apneic on weaning attempt   pCXR 3/19 p new et No def pna/ et ok   Assessment & Plan:   Fever -Suspected source: Tracheobronchitis vs pneumonia.  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  felt to be 2/2 HTN vs underlying structural lesion. Plan:  - Secondary stroke work up per Neuro - Goal Na 150-155.  Q6 hour Na levels. 3 % currently on hold since 3/17 with Na 161 this am (no change) >>  Given midline shift reluctant to replace water more aggressively unless Neuro approves   Hypertensive emergency. - Goal SBP < 140 - Controlled with norvasc & metoprolol  Acute resp failure/ vent dep Original Intubation for airway protection due to [place]  >>> apneic this am, no need to attempt any more weaning unless mental status improves   CKD, III - Baseline Cr: 2.1-2.4 in 2020 Lab Results  Component Value Date   CREATININE 2.00 (H) 07/16/2019   CREATININE 2.18 (H) 07/15/2019   CREATININE 2.12 (H) 07/14/2019   CREATININE 2.10 (H) 03/17/2019   CREATININE 2.2 (H) 01/09/2015   CREATININE 2.4 (H) 01/05/2014   CREATININE 2.4 (H) 12/31/2012   Plan: - Continue supportive care and minimize nephrotoxic agents    ? Chronic LUL pneumatocele. - Outpatient CT if neurological recovery.  Essential tremor - Home meds: Mysoline and propranolol (currently on hold)  Best practice:  Diet: Tube feeds. Pain/Anxiety/Delirium protocol (if indicated):PRN available VAP protocol (if indicated): yes  DVT prophylaxis: held, SCDs GI prophylaxis: protonix Glucose control: < 160 on labs.  No SSI  Mobility: bed Code Status: Full  Family Communication: Per primary. Disposition: ICU    The patient is critically ill with multiple organ systems failure and requires high complexity decision making for assessment and support, frequent evaluation and titration of therapies, application of advanced monitoring technologies and extensive interpretation of multiple databases. Critical Care Time devoted to patient care services described in this note is 40 minutes.    Christinia Gully, MD Pulmonary and [place]  [phone] After 6:00 PM or weekends, use Beeper [phone]  After 7:00 pm call Elink  [phone]

## 2019-07-16 NOTE — Progress Notes (Signed)
Patient compassionately extubated per MD order at family's request.

## 2019-07-16 NOTE — Progress Notes (Signed)
Pt taken to CT and back with no distress noted.

## 2019-07-16 NOTE — Plan of Care (Signed)
Family at bedside. Decided on comfort care measures. Will put in order and proceed with terminal extubation.   Rosalin Hawking, MD PhD Stroke Neurology 07/16/2019 1:48 PM

## 2019-07-16 NOTE — Progress Notes (Signed)
Patient placed on comfort measures, Morphine gtt given per MD order and compassionately extubated by RT with family at bedside per family's wishes. PRNs given for patient comfort, patient resting comfortable at this time.  Candy Sledge, RN

## 2019-07-16 NOTE — Progress Notes (Signed)
STROKE TEAM PROGRESS NOTE   INTERVAL HISTORY RN at bedside. No family at bedside. Repeat CT in the morning showed worsening cerebral edema. His Na 161 today, on free water. Increased TF. Discussed with niece and pt aunt over the phone. They shared with me that pt has mental retardation and intelligence is low. He is "free spirit" and not want to be kept in no functional state. They would like comfort care measures but niece will come to [facility] soon to initiate the process.   Vitals:   07/16/19 0310 07/16/19 0400 07/16/19 0500 07/16/19 0600  BP:  (!) 142/79 (!) 154/77 (!) 145/81  Pulse:  66 76 75  Resp:  13 13 13   Temp:  98.7 F (37.1 C)    TempSrc:  Oral    SpO2: 100% 100% 100% 100%  Weight:   55.4 kg   Height:       CBC:  Recent Labs  Lab 07/05/2019 1957 07/21/2019 2005 07/16/19 0115 07/16/19 0459  WBC 7.8   < > 5.5 6.5  NEUTROABS 5.1  --   --   --   HGB 10.7*   < > 9.0* 9.3*  HCT 33.0*   < > 28.4* 29.1*  MCV 100.3*   < > 102.9* 101.7*  PLT 192   < > 109* 103*   < > = values in this interval not displayed.   Basic Metabolic Panel:  Recent Labs  Lab 07/15/19 0532 07/15/19 1227 07/15/19 1427 07/15/19 1817 07/16/19 0115 07/16/19 0459  NA 162*   < > 163*   < > 160* 161*  K 4.0  --  3.6  --   --  3.6  CL >130*  --   --   --   --  126*  CO2 23  --   --   --   --  24  GLUCOSE 132*  --   --   --   --  129*  BUN 46*  --   --   --   --  56*  CREATININE 2.18*  --   --   --   --  2.00*  CALCIUM 8.2*  --   --   --   --  8.5*  MG 1.9  --   --   --   --  2.0  PHOS 3.8  --   --   --   --  4.0   < > = values in this interval not displayed.   Lipid Panel:     Component Value Date/Time   CHOL 117 07/13/2019 0445   CHOL 133 02/17/2017 1052   TRIG 133 07/15/2019 0144   HDL 27 (L) 07/13/2019 0445   HDL 46 02/17/2017 1052   CHOLHDL 4.3 07/13/2019 0445   VLDL 47 (H) 07/13/2019 0445   LDLCALC 43 07/13/2019 0445   LDLCALC 72 02/17/2017 1052   HgbA1c:  Lab Results  Component Value  Date   HGBA1C 5.8 (H) 07/13/2019   Urine Drug Screen:     Component Value Date/Time   LABOPIA NONE DETECTED 07/12/2019 0004   COCAINSCRNUR NONE DETECTED 07/12/2019 0004   LABBENZ POSITIVE (A) 07/12/2019 0004   AMPHETMU NONE DETECTED 07/12/2019 0004   THCU NONE DETECTED 07/12/2019 0004   LABBARB POSITIVE (A) 07/12/2019 0004    Alcohol Level     Component Value Date/Time   ETH <10 07/01/2019 1957    IMAGING past 24 hours CT HEAD WO CONTRAST  Result Date: 07/16/2019 CLINICAL DATA:  Stroke follow-up. Intracranial hemorrhage. EXAM: CT HEAD WITHOUT CONTRAST TECHNIQUE: Contiguous axial images were obtained from the base of the skull through the vertex without intravenous contrast. COMPARISON:  Head CT 07/13/2019 FINDINGS: Brain: Unchanged appearance of massive hematoma centered in the hemisphere. Rightward midline shift has worsened, now measuring 14 mm, previously 11 mm. There is persistent rightward subfalcine herniation of the cingulate gyrus. Basal cisterns remain patent. Unchanged mass effect on the lateral ventricles without hydrocephalus. No new site of hemorrhage. Edema within the left hemisphere is unchanged. Vascular: No hyperdense vessel or unexpected calcification. Skull: Normal. Negative for fracture or focal lesion. Sinuses/Orbits: No acute finding. Other: None. IMPRESSION: Unchanged appearance of massive hematoma centered in the left hemisphere with worsening rightward midline shift, now measuring 14 mm, previously 11 mm. Electronically Signed   By: Ulyses Jarred M.D.   On: 07/16/2019 04:26   DG CHEST PORT 1 VIEW  Result Date: 07/15/2019 CLINICAL DATA:  Found unresponsive admitted for intraparenchymal hemorrhage. EXAM: PORTABLE CHEST 1 VIEW COMPARISON:  07/15/2019 FINDINGS: Endotracheal tube has been repositioned, tip 5.2 centimeters above the carina. LEFT-sided PICC line tip overlies the superior vena cava. Nasogastric tube is in place with tip beyond the gastroesophageal junction  off the image. There are pleural calcifications, LEFT greater than RIGHT. No new consolidations or pleural effusions. Minimal atelectasis at the RIGHT lung base. Prominent nipple shadows on today's exam. IMPRESSION: Repositioned endotracheal tube, tip 5.2 centimeters above the carina. Electronically Signed   By: Nolon Nations M.D.   On: 07/15/2019 14:53   DG CHEST PORT 1 VIEW  Result Date: 07/15/2019 CLINICAL DATA:  Fever EXAM: PORTABLE CHEST 1 VIEW COMPARISON:  07/14/2019 FINDINGS: There is an endotracheal tube with the tip 9 cm above the carina. There is a left-sided subclavian central venous catheter with the tip projecting over the SVC. There is a nasogastric tube coursing below the diaphragm. There are is calcified left upper lung pleural plaque with adjacent bullous disease. There is no focal consolidation. There is no pleural effusion or pneumothorax. The heart and mediastinal contours are unremarkable. There is no acute osseous abnormality. IMPRESSION: Support lines and tubing in satisfactory position. No focal consolidation. Electronically Signed   By: Kathreen Devoid   On: 07/15/2019 09:46    PHYSICAL EXAM   Temp:  [98.1 F (36.7 C)-99.6 F (37.6 C)] 98.7 F (37.1 C) (03/20 0400) Pulse Rate:  [42-92] 75 (03/20 0600) Resp:  [12-22] 13 (03/20 0600) BP: (119-154)/(56-90) 145/81 (03/20 0600) SpO2:  [100 %] 100 % (03/20 0600) FiO2 (%):  [40 %] 40 % (03/20 0310) Weight:  [55.4 kg] 55.4 kg (03/20 0500)  General - Well nourished, well developed, intubated off sedation.  Ophthalmologic - fundi not visualized due to noncooperation.  Cardiovascular - Regular rate and rhythm.  Neuro - intubated off sedation, eyes closed, not following commands. Eyes midline, spontaneous rolling, not blinking to visual threat, doll's eyes present, not tracking, pupils 16mm and sluggish to light. Left eye positive for corneal but right corneal reflex absent , gag and cough present. Breathing over the vent.   Facial symmetry not able to test due to ET tube.  Tongue protrusion not cooperative. On pain stimulation, mild spontaneous purposeful movement of LUE and LLE, extension posturing of RUE and slight withdraw of RLE on pain. DTR diminished and toes equivocal. Sensation, coordination and gait not tested.   ASSESSMENT/PLAN Mr. BROGAN MARTIS is a 79 y.o. male with history of hypertension,MGUS, essential tremor, mental retardation who was found  unresponsive, bagged and paced by EMS, presenting non-verbal with R sided hemiplegia.   Stroke:  large L temporoparietal hemorrhage w/ IVH and SAH likely secondary to hypertensive vs underlying structural lesion   CT head large L superior L temporal lobe and L temporoparietal  jxn IPH w/ marked mass effect w/ partial effacement L lateral and 3rd ventricles. 69mm R midline shift, small IVH L ventricle. Probable small SAH.   CTA head L parietal IPH same w/ worsening edema, stable midline shift. Anterior displacement L MCA branches d/t mass effect.   Repeat CT 3/17 extensive L cerebral hemorrhage and 4mm midline shift   Repeat CT - 07/16/2019 - Unchanged appearance of massive hematoma centered in the left hemisphere with worsening rightward midline shift, now measuring 22mm, previously 11 mm.  2D Echo EF 65-70%. No source of embolus   LDL 43  HgbA1c 5.8  SCDs for VTE prophylaxis  aspirin 81 mg daily prior to admission, now on No antithrombotic given hemorrhage   Therapy recommendations: signed off, reconsult when pt able to participate  Disposition:  pending   Discussed with pt niece and aunt over the phone, they shared with me that pt is "free spirit" and not want to be kept in non functional state. They are leaning towards comfort care measures.  Cytotoxic Cerebral Edema Induced Hypernatremia  NSG consulted (jones) no role for surgical intervention  CT head midline shift 11 mm, out of proportion for fresh ICH, concerning for underlying  structural lesion  Repeat CT Head 07/16/2019 - Unchanged appearance of massive hematoma centered in the left hemisphere with worsening rightward midline shift, now measuring 79mm, previously 11 mm.  3% hypertonic saline protocol - on hold due to high Na  Has PICC  Na 140->143->150->152->158 ->163->162->163->160->161  Goal Na 150-155  Check Na q6h  Family considering comfort care measure soon  Acute Respiratory Failure   secondary to stroke  Intubated for airway protection  Off sedation   CCM onboard  Less responsive than 3 days ago  Hypertensivemergency  BP as high as 188/88 on arrival  Home meds:  norvasc 5  Off cleviprex  Resumed home norvasc  On metoprolol . SBP goal < 160  . Long-term BP goal normotensive  Suspected Pneumonia   Cefepime and Vancomycin started 07/15/19  On cefepime now  Temperature - 101.1->98.7  WBCs - 6.5  U/A - bacteria - rare;  Protein - 30  Dysphagia . Secondary to stroke . NPO . Speech on board . On trickle feeds @ 20 -> increased to 50cc   Other Stroke Risk Factors  Advanced age  Former Cigarette smoker, quit 31 yrs ago  Other Active Problems  Mental retardation   Essential tremor on mysoline, propranolol(currently on hold)  MGUS  ? Chronic LUL pneumatocele, OP CT recommended  Anemia of chronic dz 10.9->9.9->10.5->9.7-11.8-8.6-10.7->9.0->9.3 (macrocytic - check B12 and folate levels)  CKD IIIb, Cre 2.18->2.2->1.9->1.96-2.18->2.0  Mild thrombocytopenia - 192->109->103  Hospital day # 5  This patient is critically ill due to large left temporal ICH, IVH with midline shift, hypertensive emergency and at significant risk of neurological worsening, death form hematoma expansion, hydrocephalus, cerebral edema, brain herniation, seizure. This patient's care requires constant monitoring of vital signs, hemodynamics, respiratory and cardiac monitoring, review of multiple databases, neurological assessment, discussion  with family, other specialists and medical decision making of high complexity. I spent 40 minutes of neurocritical care time in the care of this patient. I had long discussion with pt niece and aunt over the  phone, updated pt current condition, treatment plan and poor prognosis, and answered all the questions. They expressed understanding and appreciation. They shared with me that pt has mental retardation and intelligence is low. He is "free spirit" and not want to be kept in no functional state. They would like comfort care measures but niece will come to [facility] soon to initiate the process.  Rosalin Hawking, MD PhD Stroke Neurology 07/16/2019 11:38 AM  To contact Stroke Continuity provider, please refer to [email or web address] After hours, contact General Neurology

## 2019-07-17 LAB — CULTURE, RESPIRATORY W GRAM STAIN

## 2019-07-18 DIAGNOSIS — J189 Pneumonia, unspecified organism: Secondary | ICD-10-CM | POA: Diagnosis present

## 2019-07-18 DIAGNOSIS — D696 Thrombocytopenia, unspecified: Secondary | ICD-10-CM | POA: Diagnosis present

## 2019-07-18 DIAGNOSIS — G936 Cerebral edema: Secondary | ICD-10-CM | POA: Diagnosis present

## 2019-07-18 DIAGNOSIS — D472 Monoclonal gammopathy: Secondary | ICD-10-CM | POA: Diagnosis present

## 2019-07-18 DIAGNOSIS — N183 Chronic kidney disease, stage 3 unspecified: Secondary | ICD-10-CM | POA: Diagnosis present

## 2019-07-18 DIAGNOSIS — I69391 Dysphagia following cerebral infarction: Secondary | ICD-10-CM

## 2019-07-18 NOTE — Plan of Care (Signed)
  Problem: Education: Goal: Knowledge of disease or condition will improve Outcome: Not Progressing Goal: Knowledge of secondary prevention will improve Outcome: Not Progressing Goal: Knowledge of patient specific risk factors addressed and post discharge goals established will improve Outcome: Not Progressing Goal: Individualized Educational Video(s) Outcome: Not Progressing

## 2019-07-18 NOTE — Progress Notes (Signed)
   07-Aug-2019 2300  Attending Bouse  Attending Physician Notified Y  Attending Physician (First and Last Name) Dr. Lorraine Lax  Will the above attending physician sign death certificate? Other (Comment) (Dr. Lorraine Lax)  Post Mortem Checklist  Date of Death 08/07/19  Time of Death August 09, 2318  Pronounced By Cyndi Bender, RN & Lanice Shirts, RN  Next of kin notified Yes  Name of next of kin notified of death Jared Woodward (niece)  Contact Person's Relationship to Patient Other (Comment) (niece)  Contact Person's Phone Number [phone]  Contact Person's address  ([address] [place],  [address])  Family Communication Notes Niece to call back with funeral home information.  Was the patient a No Code Blue or a Limited Code Blue? Yes  Did the patient die unattended? No  Patient restrained? Not applicable  Height 5\' 8"  (1.727 m)  Weight 55.4 kg  Body preparation complete Y  [place] [facility]  Notification Date 07/18/19  Notification Time [place] Donor Service Number [id number]  Is patient a potential donor? Y  Donation Type Tissue  Eye prep completed Yes  Autopsy  Autopsy requested by N/A  Patient Belongings/Medications Returned  Patient belongings from bedside/safe/pharmacy returned  Yes  Valuables returned to? Jared Woodward  Specify valuables returned family will pick up valuables later  Dead on Arrival (Emergency Department)  Patient dead on arrival? No  Notifications  Patient Placement notified that Post Mortem checklist is complete Yes  Patient Placement notified body transferred Transported to [employer]  Is this a medical examiner's case? [place] home name/address/phone # [place] - [address] [place]  Planned location of pickup [place]

## 2019-07-26 ENCOUNTER — Ambulatory Visit: Payer: Medicare Other

## 2019-07-28 NOTE — Death Summary Note (Signed)
Stroke Discharge Summary  Patient ID: tyner codner   MRN: [id number]      DOB: 04/12/1941  Date of Admission: 06/29/2019 Date of Discharge: 07/18/2019  Attending Physician:  Dr Rosalin Hawking, Stroke MD Consultant(s):    Collier Bullock, MD (pulmonary/intensive care) and Sherley Bounds, MD (neurosurgery) Patient's PCP:  [name], MD  DISCHARGE DIAGNOSIS:  Principal Problem:   ICH (intracerebral hemorrhage) ([place]) L temporoparietal w/ SAH and IVH Active Problems:   Anemia of chronic disease   Intellectual disability   Benign essential tremor   HLD (hyperlipidemia)   Vitamin B12 deficiency   Acute respiratory failure ([place])   Hypertensive emergency   Malnutrition of moderate degree   Cerebral edema ([place])   Suspected Pneumonia   Dysphagia due to recent stroke   CKD (chronic kidney disease), stage III   Thrombocytopenia (HCC)   MGUS (monoclonal gammopathy of unknown significance)   LABORATORY STUDIES CBC    Component Value Date/Time   WBC 6.5 07/16/2019 0459   RBC 2.86 (L) 07/16/2019 0459   HGB 9.3 (L) 07/16/2019 0459   HGB 10.1 (L) 03/17/2019 0840   HGB 10.9 (L) 07/05/2018 1352   HGB 9.8 (L) 01/09/2015 0934   HCT 29.1 (L) 07/16/2019 0459   HCT 30.8 (L) 07/05/2018 1352   HCT 29.7 (L) 01/09/2015 0934   PLT 103 (L) 07/16/2019 0459   PLT 204 03/17/2019 0840   PLT 225 07/05/2018 1352   MCV 101.7 (H) 07/16/2019 0459   MCV 96 07/05/2018 1352   MCV 95.8 01/09/2015 0934   MCH 32.5 07/16/2019 0459   MCHC 32.0 07/16/2019 0459   RDW 15.6 (H) 07/16/2019 0459   RDW 13.0 07/05/2018 1352   RDW 13.7 01/09/2015 0934   LYMPHSABS 2.4 07/21/2019 1957   LYMPHSABS 1.2 01/22/2015 1047   LYMPHSABS 1.3 01/09/2015 0934   MONOABS 0.3 07/22/2019 1957   MONOABS 0.2 01/09/2015 0934   EOSABS 0.0 07/15/2019 1957   EOSABS 0.1 01/22/2015 1047   BASOSABS 0.0 06/29/2019 1957   BASOSABS 0.0 01/22/2015 1047   BASOSABS 0.0 01/09/2015 0934   CMP    Component Value Date/Time   NA 161  (HH) 07/16/2019 0459   NA 140 07/05/2018 1352   NA 142 01/09/2015 0934   K 3.6 07/16/2019 0459   K 4.6 01/09/2015 0934   CL 126 (H) 07/16/2019 0459   CO2 24 07/16/2019 0459   CO2 31 (H) 01/09/2015 0934   GLUCOSE 129 (H) 07/16/2019 0459   GLUCOSE 108 01/09/2015 0934   BUN 56 (H) 07/16/2019 0459   BUN 26 07/05/2018 1352   BUN 30.7 (H) 01/09/2015 0934   CREATININE 2.00 (H) 07/16/2019 0459   CREATININE 2.10 (H) 03/17/2019 0840   CREATININE 2.2 (H) 01/09/2015 0934   CALCIUM 8.5 (L) 07/16/2019 0459   CALCIUM 9.1 01/09/2015 0934   PROT 6.7 07/13/2019 1957   PROT 6.0 (L) 01/09/2015 0934   ALBUMIN 3.3 (L) 07/21/2019 1957   ALBUMIN 3.3 (L) 01/09/2015 0934   AST 21 07/08/2019 1957   AST 15 03/17/2019 0840   AST 12 01/09/2015 0934   ALT 21 07/16/2019 1957   ALT 13 03/17/2019 0840   ALT 7 01/09/2015 0934   ALKPHOS 80 07/05/2019 1957   ALKPHOS 42 01/09/2015 0934   BILITOT 0.4 07/18/2019 1957   BILITOT 0.3 03/17/2019 0840   BILITOT 0.27 01/09/2015 0934   GFRNONAA 31 (L) 07/16/2019 0459   GFRNONAA 30 (L) 03/17/2019 0840   GFRNONAA 31 (L)  08/31/2012 1145   GFRAA 36 (L) 07/16/2019 0459   GFRAA 34 (L) 03/17/2019 0840   GFRAA 36 (L) 08/31/2012 1145   COAGS Lab Results  Component Value Date   INR 1.0 07/12/2019   Lipid Panel    Component Value Date/Time   CHOL 117 07/13/2019 0445   CHOL 133 02/17/2017 1052   TRIG 133 07/15/2019 0144   HDL 27 (L) 07/13/2019 0445   HDL 46 02/17/2017 1052   CHOLHDL 4.3 07/13/2019 0445   VLDL 47 (H) 07/13/2019 0445   LDLCALC 43 07/13/2019 0445   LDLCALC 72 02/17/2017 1052   HgbA1C  Lab Results  Component Value Date   HGBA1C 5.8 (H) 07/13/2019   Urinalysis    Component Value Date/Time   COLORURINE YELLOW 07/15/2019 1013   APPEARANCEUR CLEAR 07/15/2019 1013   LABSPEC 1.018 07/15/2019 1013   PHURINE 5.0 07/15/2019 1013   GLUCOSEU NEGATIVE 07/15/2019 1013   HGBUR NEGATIVE 07/15/2019 1013   BILIRUBINUR NEGATIVE 07/15/2019 1013   KETONESUR  NEGATIVE 07/15/2019 1013   PROTEINUR 30 (A) 07/15/2019 1013   UROBILINOGEN 0.2 12/12/2014 1818   NITRITE NEGATIVE 07/15/2019 1013   LEUKOCYTESUR NEGATIVE 07/15/2019 1013   Urine Drug Screen     Component Value Date/Time   LABOPIA NONE DETECTED 07/12/2019 0004   COCAINSCRNUR NONE DETECTED 07/12/2019 0004   LABBENZ POSITIVE (A) 07/12/2019 0004   AMPHETMU NONE DETECTED 07/12/2019 0004   THCU NONE DETECTED 07/12/2019 0004   LABBARB POSITIVE (A) 07/12/2019 0004    Alcohol Level    Component Value Date/Time   ETH <10 06/27/2019 1957   SIGNIFICANT DIAGNOSTIC STUDIES CT ANGIO HEAD W OR WO CONTRAST  Result Date: 07/12/2019 CLINICAL DATA:  Intracranial hemorrhage. EXAM: CT ANGIOGRAPHY HEAD TECHNIQUE: Multidetector CT imaging of the head was performed using the standard protocol during bolus administration of intravenous contrast. Multiplanar CT image reconstructions and MIPs were obtained to evaluate the vascular anatomy. CONTRAST:  44mL OMNIPAQUE IOHEXOL 350 MG/ML SOLN COMPARISON:  None. FINDINGS: Allowing for differences in slice selection, large intraparenchymal hematoma centered in the left parietal lobe is unchanged size. Surrounding edema has worsened slightly. At the level of the foramina of [place], rightward midline shift measures 11 mm, unchanged. There is persistent narrowing of the cerebral aqueduct. POSTERIOR CIRCULATION: --Vertebral arteries: Normal V4 segments. --Posterior inferior cerebellar arteries (PICA): Patent origins from the vertebral arteries. --Anterior inferior cerebellar arteries (AICA): Patent origins from the basilar artery. --Basilar artery: Normal. --Superior cerebellar arteries: Normal. --Posterior cerebral arteries: Normal. There are bilateral posterior communicating arteries (p-comm) that partially supply the PCAs. ANTERIOR CIRCULATION: --Intracranial internal carotid arteries: Normal. --Anterior cerebral arteries (ACA): Normal. Both A1 segments are present. Patent  anterior communicating artery (a-comm). --Middle cerebral arteries (MCA): Left MCA branches are displaced anteriorly due to mass effect from the left parietal lobe hemorrhage. Venous sinuses: As permitted by contrast timing, patent. Anatomic variants: None IMPRESSION: 1. Unchanged size of large left parietal lobe intraparenchymal hematoma with slight worsening of surrounding edema and unchanged 11 mm rightward midline shift. 2. No aneurysm or vascular malformation. 3. Anterior displacement of the left MCA branches due to mass effect from the intraparenchymal hematoma. Electronically Signed   By: Ulyses Jarred M.D.   On: 07/12/2019 01:09   CT HEAD WO CONTRAST  Result Date: 07/16/2019 CLINICAL DATA:  Stroke follow-up. Intracranial hemorrhage. EXAM: CT HEAD WITHOUT CONTRAST TECHNIQUE: Contiguous axial images were obtained from the base of the skull through the vertex without intravenous contrast. COMPARISON:  Head CT 07/13/2019 FINDINGS:  Brain: Unchanged appearance of massive hematoma centered in the hemisphere. Rightward midline shift has worsened, now measuring 14 mm, previously 11 mm. There is persistent rightward subfalcine herniation of the cingulate gyrus. Basal cisterns remain patent. Unchanged mass effect on the lateral ventricles without hydrocephalus. No new site of hemorrhage. Edema within the left hemisphere is unchanged. Vascular: No hyperdense vessel or unexpected calcification. Skull: Normal. Negative for fracture or focal lesion. Sinuses/Orbits: No acute finding. Other: None. IMPRESSION: Unchanged appearance of massive hematoma centered in the left hemisphere with worsening rightward midline shift, now measuring 14 mm, previously 11 mm. Electronically Signed   By: Ulyses Jarred M.D.   On: 07/16/2019 04:26   CT HEAD WO CONTRAST  Result Date: 07/13/2019 CLINICAL DATA:  Stroke follow-up EXAM: CT HEAD WITHOUT CONTRAST TECHNIQUE: Contiguous axial images were obtained from the base of the skull  through the vertex without intravenous contrast. COMPARISON:  Yesterday FINDINGS: Brain: Large irregularly-shaped hematoma in the left cerebrum with irregular shape limiting reproducible measurement. The high-density component is not clearly changed but there is a low-density medial component (that is distinguishable given lining by high-density clot) that is increased. There is prominent leftward mediastinal shift which measures up to 11 mm. Shift has increased by 2 mm. No entrapment or visible ACA territory infarct. Vascular: Atherosclerotic calcification Skull: No acute finding Sinuses/Orbits: No acute finding IMPRESSION: Extensive left cerebral hemorrhage with increased low-density component. Midline shift has increased by 2 mm to 11 mm. Electronically Signed   By: Monte Fantasia M.D.   On: 07/13/2019 06:31   DG CHEST PORT 1 VIEW  Result Date: 07/15/2019 CLINICAL DATA:  Found unresponsive admitted for intraparenchymal hemorrhage. EXAM: PORTABLE CHEST 1 VIEW COMPARISON:  07/15/2019 FINDINGS: Endotracheal tube has been repositioned, tip 5.2 centimeters above the carina. LEFT-sided PICC line tip overlies the superior vena cava. Nasogastric tube is in place with tip beyond the gastroesophageal junction off the image. There are pleural calcifications, LEFT greater than RIGHT. No new consolidations or pleural effusions. Minimal atelectasis at the RIGHT lung base. Prominent nipple shadows on today's exam. IMPRESSION: Repositioned endotracheal tube, tip 5.2 centimeters above the carina. Electronically Signed   By: Nolon Nations M.D.   On: 07/15/2019 14:53   DG CHEST PORT 1 VIEW  Result Date: 07/15/2019 CLINICAL DATA:  Fever EXAM: PORTABLE CHEST 1 VIEW COMPARISON:  07/14/2019 FINDINGS: There is an endotracheal tube with the tip 9 cm above the carina. There is a left-sided subclavian central venous catheter with the tip projecting over the SVC. There is a nasogastric tube coursing below the diaphragm. There  are is calcified left upper lung pleural plaque with adjacent bullous disease. There is no focal consolidation. There is no pleural effusion or pneumothorax. The heart and mediastinal contours are unremarkable. There is no acute osseous abnormality. IMPRESSION: Support lines and tubing in satisfactory position. No focal consolidation. Electronically Signed   By: Kathreen Devoid   On: 07/15/2019 09:46   DG Chest Port 1 View  Result Date: 07/14/2019 CLINICAL DATA:  79 year old male with history of acute respiratory failure. EXAM: PORTABLE CHEST 1 VIEW COMPARISON:  Chest x-ray 07/04/2019. FINDINGS: An endotracheal tube is in place with tip 8.0 cm above the carina. There is a left upper extremity PICC with tip terminating in the distal superior vena cava. A nasogastric tube is seen extending into the stomach, however, the tip of the nasogastric tube extends below the lower margin of the image. Lung volumes are normal. Chronic area of architectural distortion with  peripheral pleural calcification in the left mid to upper lung, similar to prior examinations, better demonstrated on prior chest CT 04/25/2016, likely sequela of remote prior necrotizing pneumonia. No consolidative airspace disease. No pleural effusions. No definite suspicious appearing pulmonary nodules or masses are noted. No evidence of pulmonary edema. No pneumothorax. Heart size is upper limits of normal. Upper mediastinal contours are within normal limits. Aortic atherosclerosis. IMPRESSION: 1. Support apparatus, as above. 2. No radiographic evidence of acute cardiopulmonary disease. Electronically Signed   By: Vinnie Langton M.D.   On: 07/14/2019 08:19   DG Chest Portable 1 View  Result Date: 07/02/2019 CLINICAL DATA:  Altered mental status. EXAM: PORTABLE CHEST 1 VIEW COMPARISON:  October 23, 2018 FINDINGS: Normal cardiomediastinal silhouette. An endotracheal tube tip projects 5.1 cm above the carina. An enteric tube projects on the left upper  abdomen in the expected location of the gastric lumen. Chronic mild hyperinflation with shallow lung inflation. A complex thick walled peripherally calcified pneumatocele is redemonstrated in the left upper lobe with similar radiographic appearance compared to June 2020. Probable bilateral nipple shadows project on the fifth anterior ribs; no pulmonary nodularity was present in this region on the remote April 25, 2016 CT chest examination. Aortic knob calcified atherosclerosis and ectasia. Telemetry leads. No pneumonia or pulmonary edema. No obvious pleural effusion or pneumothorax. No free subdiaphragmatic air. IMPRESSION: Support device placement; a 1.6 cm advancement of the endotracheal tube could be considered. 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 Once the patient has recovered from his current acute illness, outpatient CT chest without intravenous contrast recommended to better assess the stability of the pneumatocele compared to 2017. Probable bilateral nipple shadows. No pneumonia or obvious pleural effusion or pulmonary edema. Thoracic aortic calcified atherosclerosis and ectasia. Electronically Signed   By: Revonda Humphrey   On: 07/27/2019 20:54   ECHOCARDIOGRAM COMPLETE  Result Date: 07/12/2019    ECHOCARDIOGRAM REPORT   Patient Name:   ANGELITO HOPPING Date of Exam: 07/12/2019 Medical Rec #:  [id number]         Height:       68.0 in Accession #:    [id number]        Weight:       121.3 lb Date of Birth:  30-Sep-1940          BSA:          1.652 m Patient Age:    39 years          BP:           128/78 mmHg Patient Gender: M                 HR:           86 bpm. Exam Location:  Inpatient Procedure: 2D Echo, Cardiac Doppler and Color Doppler Indications:    Stroke 434.91/I163.9  History:        Patient has no prior history of Echocardiogram examinations.                 Risk  Factors:Hypertension, Dyslipidemia and Former Smoker. CKD.  Sonographer:    Clayton Lefort RDCS (AE) Referring Phys: [id number] [place]  1. Left ventricular ejection fraction, by estimation, is 65 to 70%. The left ventricle has normal function. The left ventricle has no regional wall motion abnormalities. There is moderate left ventricular hypertrophy. Left ventricular diastolic parameters are consistent with  Grade I diastolic dysfunction (impaired relaxation).  2. There is moderately elevated pulmonary artery systolic pressure. The estimated right ventricular systolic pressure is 38 mmHg+ RAP (on vent).  3. The aortic valve is tricuspid. Aortic valve regurgitation is mild to moderate. Mild aortic valve sclerosis is present, with no evidence of aortic valve stenosis.  4. The inferior vena cava is normal in size with <50% respiratory variability, suggesting right atrial pressure of 8 mmHg.  5. The mitral valve is grossly normal. Trivial mitral valve regurgitation. FINDINGS  Left Ventricle: Left ventricular ejection fraction, by estimation, is 65 to 70%. The left ventricle has normal function. The left ventricle has no regional wall motion abnormalities. The left ventricular internal cavity size was normal in size. There is  moderate left ventricular hypertrophy. Left ventricular diastolic parameters are consistent with Grade I diastolic dysfunction (impaired relaxation). Indeterminate filling pressures. Right Ventricle: The right ventricular size is normal. No increase in right ventricular wall thickness. Right ventricular systolic function is normal. Left Atrium: Left atrial size was normal in size. Right Atrium: Right atrial size was normal in size. Pericardium: There is no evidence of pericardial effusion. Mitral Valve: The mitral valve is grossly normal. Trivial mitral valve regurgitation. Tricuspid Valve: The tricuspid valve is grossly normal. Tricuspid valve regurgitation is mild. Aortic Valve: The  aortic valve is tricuspid. Aortic valve regurgitation is mild to moderate. Mild aortic valve sclerosis is present, with no evidence of aortic valve stenosis. Pulmonic Valve: The pulmonic valve was grossly normal. Pulmonic valve regurgitation is not visualized. Aorta: The aortic root and ascending aorta are structurally normal, with no evidence of dilitation. Venous: The inferior vena cava is normal in size with less than 50% respiratory variability, suggesting right atrial pressure of 8 mmHg. IAS/Shunts: No atrial level shunt detected by color flow Doppler.  LEFT VENTRICLE PLAX 2D LVIDd:         4.26 cm  Diastology LVIDs:         2.47 cm  LV e' lateral:   8.92 cm/s LV PW:         1.08 cm  LV E/e' lateral: 9.6 LV IVS:        1.43 cm  LV e' medial:    5.33 cm/s LVOT diam:     2.20 cm  LV E/e' medial:  16.1 LV SV:         76 LV SV Index:   46 LVOT Area:     3.80 cm  RIGHT VENTRICLE             IVC RV Basal diam:  2.35 cm     IVC diam: 1.39 cm RV S prime:     16.80 cm/s TAPSE (M-mode): 2.4 cm LEFT ATRIUM             Index       RIGHT ATRIUM           Index LA diam:        2.30 cm 1.39 cm/m  RA Area:     13.80 cm LA Vol (A2C):   15.3 ml 9.26 ml/m  RA Volume:   34.40 ml  20.82 ml/m LA Vol (A4C):   18.1 ml 10.96 ml/m LA Biplane Vol: 17.8 ml 10.77 ml/m  AORTIC VALVE AV Area (Vmax):    2.40 cm AV Area (Vmean):   2.39 cm AV Area (VTI):     2.46 cm AV Vmax:           176.00 cm/s AV  Vmean:          106.000 cm/s AV VTI:            0.309 m AV Peak Grad:      12.4 mmHg AV Mean Grad:      6.0 mmHg LVOT Vmax:         111.00 cm/s LVOT Vmean:        66.600 cm/s LVOT VTI:          0.200 m LVOT/AV VTI ratio: 0.65  AORTA Ao Root diam: 2.70 cm Ao Asc diam:  3.10 cm MITRAL VALVE                TRICUSPID VALVE MV Area (PHT): 3.77 cm     TR Peak grad:   37.9 mmHg MV Peak grad:  3.9 mmHg     TR Vmax:        308.00 cm/s MV Mean grad:  2.0 mmHg MV Vmax:       0.99 m/s     SHUNTS MV Vmean:      66.0 cm/s    Systemic VTI:  0.20 m MV  Decel Time: 201 msec     Systemic Diam: 2.20 cm MV E velocity: 85.70 cm/s MV A velocity: 102.00 cm/s MV E/A ratio:  0.84 Lyman Bishop MD Electronically signed by Lyman Bishop MD Signature Date/Time: 07/12/2019/12:41:24 PM    Final    CT HEAD CODE STROKE WO CONTRAST  Addendum Date: 06/28/2019   ADDENDUM REPORT: 07/12/2019 20:30 ADDENDUM: These results were called by telephone at the time of interpretation on 07/18/2019 at 8:30 pm to provider Dr. Lorraine Lax, who verbally acknowledged these results. Electronically Signed   By: Kellie Simmering DO   On: 06/28/2019 20:30   Result Date: 07/08/2019 CLINICAL DATA:  Code stroke. Neuro deficit, acute, stroke suspected. EXAM: CT HEAD WITHOUT CONTRAST TECHNIQUE: Contiguous axial images were obtained from the base of the skull through the vertex without intravenous contrast. COMPARISON:  No pertinent prior studies available for comparison. FINDINGS: Brain: There is a large acute parenchymal hemorrhage with prominent surrounding edema centered within the superior left temporal lobe and left temporoparietal junction. The dominant portion of the hemorrhage measures 7.4 x 4.5 x 4.6 cm (AP x TV x CC). There is also small volume intraventricular extension into the left lateral ventricle (series 5, image 37). There is significant mass effect with partial effacement of the left lateral and third ventricles. 11 mm rightward midline shift. No definite effacement of the basal cisterns on the current study. There is also small volume curvilinear hyperdensity along the inferior aspect of the hemorrhage, which may reflect subarachnoid extension. Vascular: No definite hyperdense vessel. Skull: Normal. Negative for fracture or focal lesion. Sinuses/Orbits: Visualized orbits demonstrate no acute abnormality. No significant paranasal sinus disease or mastoid effusion at the imaged levels. IMPRESSION: Large 7.4 x 4.5 x 4.6 cm acute parenchymal hemorrhage with prominent surrounding edema centered  within the superior left temporal lobe and left temporoparietal junction. Marked mass effect with partial effacement of the left lateral and third ventricles. 11 mm rightward midline shift. Small volume intraventricular extension into the left lateral ventricle. There is also probable small volume subarachnoid extension of hemorrhage along the inferior aspect of the hematoma. Electronically Signed: By: Kellie Simmering DO On: 07/07/2019 20:21   [place] EKG SITE RITE  Result Date: 07/12/2019 If Site Rite image not attached, placement could not be confirmed due to current cardiac rhythm.     HISTORY OF PRESENT  ILLNESS MAVIN DYKE is a 79 y.o. male with past medical history significant for hypertension,MGUS, essential tremor, mental retardation presents to the emergency department after being found unresponsive by family.  He was last known well 07/09/2019 at 1750.  Patient had agonal breathing in route and was bagged and paced.  Patient responded to pacing and was sedated with Versed.  Blood pressure was greater than 578 systolic. On arrival to [facility] ER, patient awake but nonverbal.  Right-sided was plegic.  Stat CT head was obtained which showed a large left temporoparietal hemorrhage measuring approximately 50 cc with mild intraventricular hemorrhage.  11 mm midline shift with partial effacement of left lateral and third ventricles.  Patient was intubated for airway protection. NIHSS: 36. Baseline MRS 2. Intracerebral Hemorrhage (ICH) Score: 2   HOSPITAL COURSE Mr. GAVAN NORDBY is a 79 y.o. male with history of hypertension,MGUS, essential tremor,mental retardation who was found unresponsive, bagged and paced by EMS, presenting non-verbal with R sided hemiplegia.   Stroke:  large L temporoparietal hemorrhage w/ IVH and SAH likely secondary to hypertensive vs underlying structural lesion   CT head large L superior L temporal lobe and L temporoparietal  jxn IPH w/ marked mass effect w/ partial  effacement L lateral and 3rd ventricles. 31mm R midline shift, small IVH L ventricle. Probable small SAH.   CTA head L parietal IPH same w/ worsening edema, stable midline shift. Anterior displacement L MCA branches d/t mass effect.   Repeat CT 3/17 extensive L cerebral hemorrhage and 4mm midline shift   Repeat CT - 07/16/2019 - Unchanged appearance of massive hematoma centered in the left hemisphere with worsening rightward midline shift, now measuring 17mm, previously 11 mm.  2D Echo EF 65-70%. No source of embolus   LDL 43  HgbA1c 5.8  aspirin 81 mg daily prior to admission, now on No antithrombotic given hemorrhage   Discussed with pt niece and aunt over the phone, they shared with me that pt is "free spirit" and not want to be kept in non functional state.  Family opted for comfort care. Terminally extubated.  Cytotoxic Cerebral Edema Induced Hypernatremia  NSG consulted (jones) no role for surgical intervention  CT head midline shift 11 mm, out of proportion for fresh ICH, concerning for underlying structural lesion  Repeat CT Head 07/16/2019 - Unchanged appearance of massive hematoma centered in the left hemisphere with worsening rightward midline shift, now measuring 63mm, previously 11 mm.  3% hypertonic saline off  Has PICC -> removed  Na 140->143->150->152->158 ->163->162->163->160->161  Family requested comfort care measures  Acute Respiratory Failure   secondary to stroke  Intubated for airway protection  Off sedation   Terminally extubated 3/20 for comfort care measures  Treated with morphine drip, shallow breathing with decreased RR  Hypertensivemergency  BP as high as 188/88 on arrival  Home meds:  norvasc 5  Treated with IV cleviprex  Suspected Pneumonia   Cefepime and Vancomycin started 07/15/19  Treated with cefepime   Temperature - 101.1->98.7->100.8  WBCs - 6.5  U/A - bacteria - rare;  Protein - 30  Dysphagia Malnutrition    Secondary to stroke  NPO  Given TF and Prostat in ICU   Other Stroke Risk Factors  Advanced age  Former Cigarette smoker, quit 31 yrs ago  Other Active Problems  Mental retardation   Essential tremor on mysoline, on propranolol PTA  MGUS  Anemia of chronic dz, (macrocytic)  CKD IIIb  Mild thrombocytopenia  DISCHARGE   Death:  Jul 19, 2019 at July 21, 2318  Disposition:  Funeral home: Wynetta Emery Son in [place], [place], MSN, APRN, ANVP-BC, AGPCNP-BC Advanced Practice Stroke Nurse [place] for Schedule & Pager information 07/18/2019 5:03 PM

## 2019-07-28 NOTE — Progress Notes (Signed)
Placed a call to patients emergency contact (niece) Jared Woodward to inform that the patient had made significant changes in his breathing that appeared that death would be imminent. She was appreciative of update and stated she would get her things together and come back to the hospital. 2019/07/27 @ 2215 Cyndi Bender, RN

## 2019-07-28 NOTE — Progress Notes (Signed)
STROKE TEAM PROGRESS NOTE   INTERVAL HISTORY No family at bedside. Pt on comfort care measures with morphine drip, gradually increased to 8mg /h. He reclined in bed, in coma, shallow short breath, about 10 per min. No acute distress. Continue comfort care.   Vitals:   07/16/19 2000 07/16/19 2100 07/16/19 2217 07/23/19 0426  BP:   115/65 104/61  Pulse: 100 (!) 109 (!) 104 (!) 101  Resp: 11 10 10 11   Temp:   (!) 100.8 F (38.2 C) 99.8 F (37.7 C)  TempSrc:   Oral   SpO2: (!) 74% (!) 65% (!) 67% (!) 76%  Weight:      Height:       CBC:  Recent Labs  Lab 07/10/2019 1957 07/05/2019 2005 07/16/19 0115 07/16/19 0459  WBC 7.8   < > 5.5 6.5  NEUTROABS 5.1  --   --   --   HGB 10.7*   < > 9.0* 9.3*  HCT 33.0*   < > 28.4* 29.1*  MCV 100.3*   < > 102.9* 101.7*  PLT 192   < > 109* 103*   < > = values in this interval not displayed.   Basic Metabolic Panel:  Recent Labs  Lab 07/15/19 0532 07/15/19 1227 07/15/19 1427 07/15/19 1817 07/16/19 0115 07/16/19 0459  NA 162*   < > 163*   < > 160* 161*  K 4.0  --  3.6  --   --  3.6  CL >130*  --   --   --   --  126*  CO2 23  --   --   --   --  24  GLUCOSE 132*  --   --   --   --  129*  BUN 46*  --   --   --   --  56*  CREATININE 2.18*  --   --   --   --  2.00*  CALCIUM 8.2*  --   --   --   --  8.5*  MG 1.9  --   --   --   --  2.0  PHOS 3.8  --   --   --   --  4.0   < > = values in this interval not displayed.   Lipid Panel:     Component Value Date/Time   CHOL 117 07/13/2019 0445   CHOL 133 02/17/2017 1052   TRIG 133 07/15/2019 0144   HDL 27 (L) 07/13/2019 0445   HDL 46 02/17/2017 1052   CHOLHDL 4.3 07/13/2019 0445   VLDL 47 (H) 07/13/2019 0445   LDLCALC 43 07/13/2019 0445   LDLCALC 72 02/17/2017 1052   HgbA1c:  Lab Results  Component Value Date   HGBA1C 5.8 (H) 07/13/2019   Urine Drug Screen:     Component Value Date/Time   LABOPIA NONE DETECTED 07/12/2019 0004   COCAINSCRNUR NONE DETECTED 07/12/2019 0004   LABBENZ  POSITIVE (A) 07/12/2019 0004   AMPHETMU NONE DETECTED 07/12/2019 0004   THCU NONE DETECTED 07/12/2019 0004   LABBARB POSITIVE (A) 07/12/2019 0004    Alcohol Level     Component Value Date/Time   ETH <10 06/28/2019 1957    IMAGING past 24 hours No results found.  PHYSICAL EXAM   Temp:  [99.8 F (37.7 C)-100.8 F (38.2 C)] 99.8 F (37.7 C) (03/21 0426) Pulse Rate:  [84-109] 101 (03/21 0426) Resp:  [10-14] 11 (03/21 0426) BP: (104-148)/(61-76) 104/61 (03/21 0426) SpO2:  [54 %-100 %]  76 % (03/21 0426)  Exam limited due to comfort care measures.  Patient reclined in bed, not responsive, not open eyes on voice, eyes closed.  Shallow breathing with decreased respiratory rate.  No spontaneous movement of all extremities.   ASSESSMENT/PLAN Jared Woodward is a 79 y.o. male with history of hypertension,MGUS, essential tremor, mental retardation who was found unresponsive, bagged and paced by EMS, presenting non-verbal with R sided hemiplegia.   Stroke:  large L temporoparietal hemorrhage w/ IVH and SAH likely secondary to hypertensive vs underlying structural lesion   CT head large L superior L temporal lobe and L temporoparietal  jxn IPH w/ marked mass effect w/ partial effacement L lateral and 3rd ventricles. 62mm R midline shift, small IVH L ventricle. Probable small SAH.   CTA head L parietal IPH same w/ worsening edema, stable midline shift. Anterior displacement L MCA branches d/t mass effect.   Repeat CT 3/17 extensive L cerebral hemorrhage and 58mm midline shift   Repeat CT - 07/16/2019 - Unchanged appearance of massive hematoma centered in the left hemisphere with worsening rightward midline shift, now measuring 49mm, previously 11 mm.  2D Echo EF 65-70%. No source of embolus   LDL 43  HgbA1c 5.8  SCDs for VTE prophylaxis  aspirin 81 mg daily prior to admission, now on No antithrombotic given hemorrhage   Discussed with pt niece and aunt over the phone, they  shared with me that pt is "free spirit" and not want to be kept in non functional state.  Now on comfort care measures.  Cytotoxic Cerebral Edema Induced Hypernatremia  NSG consulted (jones) no role for surgical intervention  CT head midline shift 11 mm, out of proportion for fresh ICH, concerning for underlying structural lesion  Repeat CT Head 07/16/2019 - Unchanged appearance of massive hematoma centered in the left hemisphere with worsening rightward midline shift, now measuring 66mm, previously 11 mm.  3% hypertonic saline off  Has PICC -> removed  Na 140->143->150->152->158 ->163->162->163->160->161  Family requested comfort care measures  Acute Respiratory Failure   secondary to stroke  Intubated for airway protection  Off sedation   Terminally extubated yesterday for comfort care measures  Currently on morphine drip, shallow breathing with decreased RR  Hypertensivemergency  BP as high as 188/88 on arrival  Home meds:  norvasc 5  Off cleviprex  Suspected Pneumonia   Cefepime and Vancomycin started 07/15/19  On cefepime now  Temperature - 101.1->98.7->100.8  WBCs - 6.5  U/A - bacteria - rare;  Protein - 30  Dysphagia . Secondary to stroke . NPO . Complicated measures   Other Stroke Risk Factors  Advanced age  Former Cigarette smoker, quit 31 yrs ago  Other Active Problems  Mental retardation   Essential tremor on mysoline, propranolol(currently on hold)  MGUS  Anemia of chronic dz 10.9->9.9->10.5->9.7-11.8-8.6-10.7->9.0->9.3 (macrocytic - check B12 and folate levels)  CKD IIIb, Cre 2.18->2.2->1.9->1.96-2.18->2.0  Mild thrombocytopenia - 192->109->103  Hospital day # 6  Rosalin Hawking, MD PhD Stroke Neurology 07-19-19 6:07 PM  To contact Stroke Continuity provider, please refer to [email or web address] After hours, contact General Neurology

## 2019-07-28 NOTE — Progress Notes (Signed)
Discussed with Dr Erlinda Hong - pt extubated and focus is on comfort measures.  PCCM available prn

## 2019-07-28 NOTE — Progress Notes (Signed)
After patients passing 45 ml of Morphine 100mg /ml was wasted from his remaining IV drip. Charge RN Nellie Ragaas witnessed and assisted with waste in stericycle containment. 22-Jul-2019 @ [place] Cyndi Bender, RN

## 2019-07-28 NOTE — Progress Notes (Signed)
At 2318 entered room to check on patient as his niece had not arrived. I found his respirations to be more agonal than prior and only 6 breaths per minute. I stayed with the patient as he expired, holding his hand and softly speaking to him. He passed peaceful at 2320. Charge RN Nellie called to witness pronouncement of time of death.  Dr. Lorraine Lax called to inform of death,  he also came to the unit to sign the death certificate. At approximately [place] on 07/18/2019 I placed a second call to patients niece Kenney Houseman who had not yet arrived. She explained that she would not be able to return tonight so I informed her of the patient's passing. She was appreciative of care provided, information and updates given. She will call back with funeral home info after speaking to her mother. 07/18/2019 @ 0500 Cyndi Bender, RN

## 2019-07-28 DEATH — deceased

## 2020-03-16 ENCOUNTER — Ambulatory Visit: Payer: Medicare Other | Admitting: Oncology

## 2020-03-16 ENCOUNTER — Other Ambulatory Visit: Payer: Medicare Other

## 2020-08-05 IMAGING — DX DG LUMBAR SPINE COMPLETE 4+V
5 series · 5 of 5 positions shown · non-contrast
Comparison: Bone survey, 02/27/2011

CLINICAL DATA: Fall, initial encounter, falls; point tenderness on
spine with h/o paraproteinemia concern for lytic lesion, stated he
was walking out in parking lot and tried to step over parking block
and foot didn't make it over and lost balance and fell on left side
x 1 month ago, having pain in bilateral lower back and in midline
upper back, HTN, past smoker

EXAM:
LUMBAR SPINE - COMPLETE 4+ VIEW

[l-spine ap]
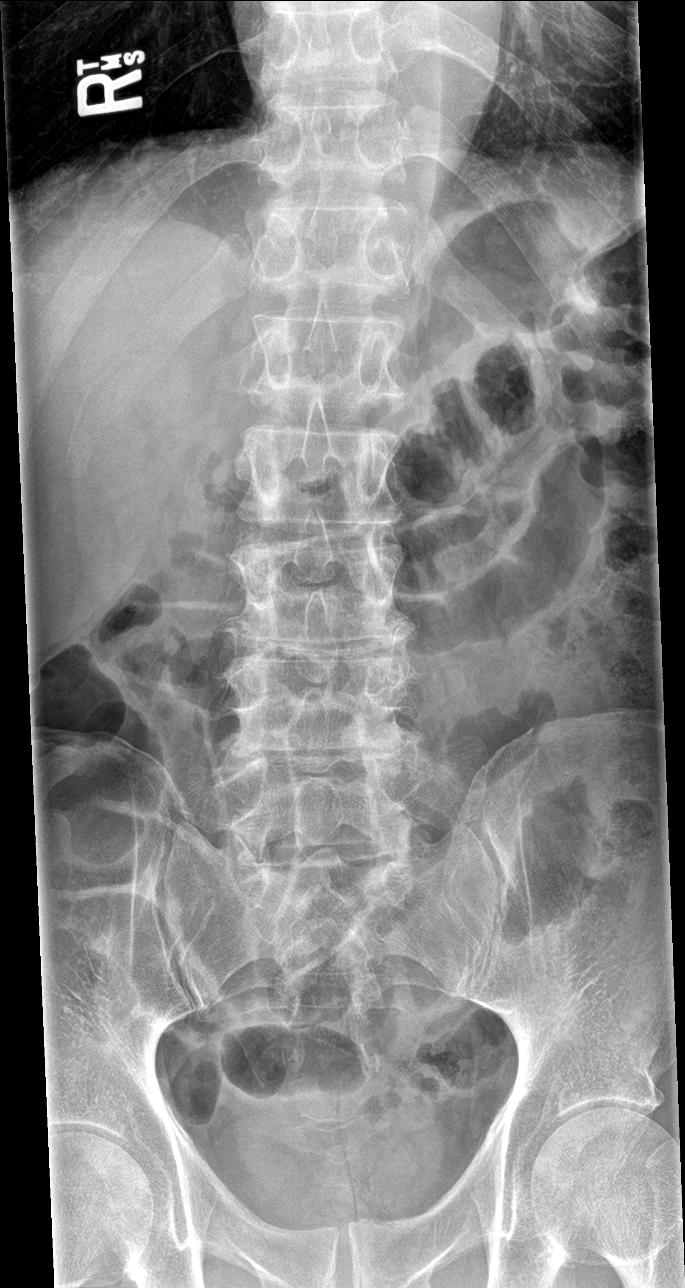

[l-spine obl (1 of 2)]
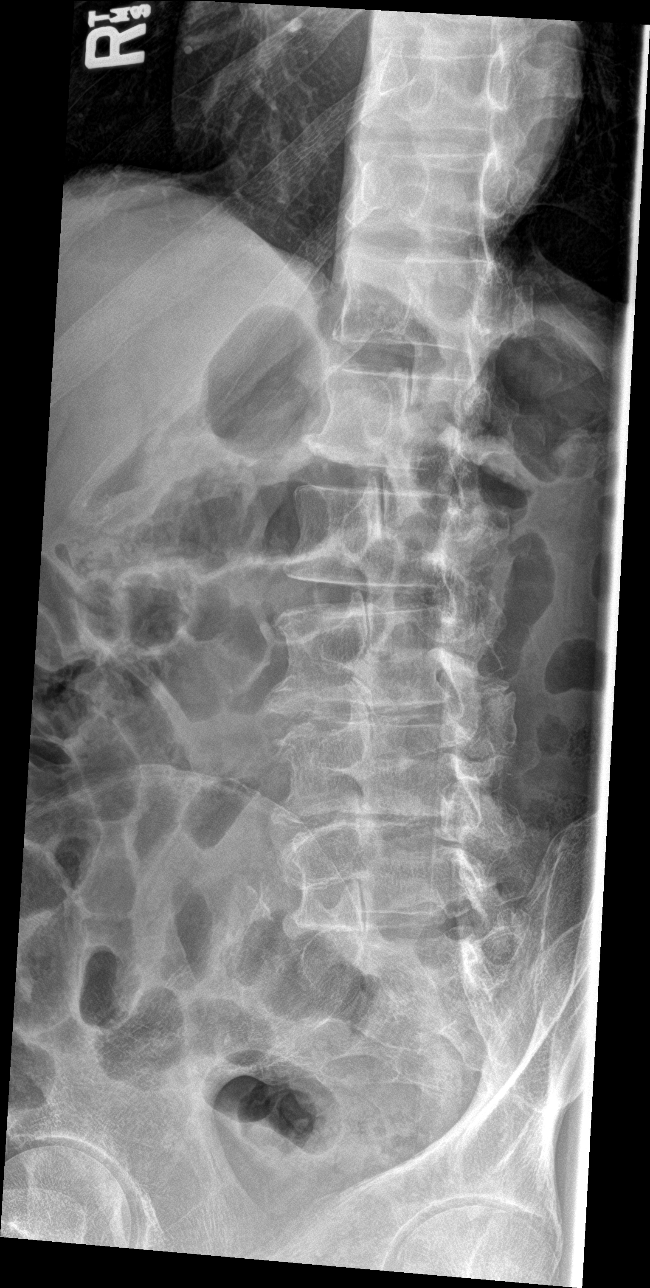

[l-spine obl (2 of 2)]
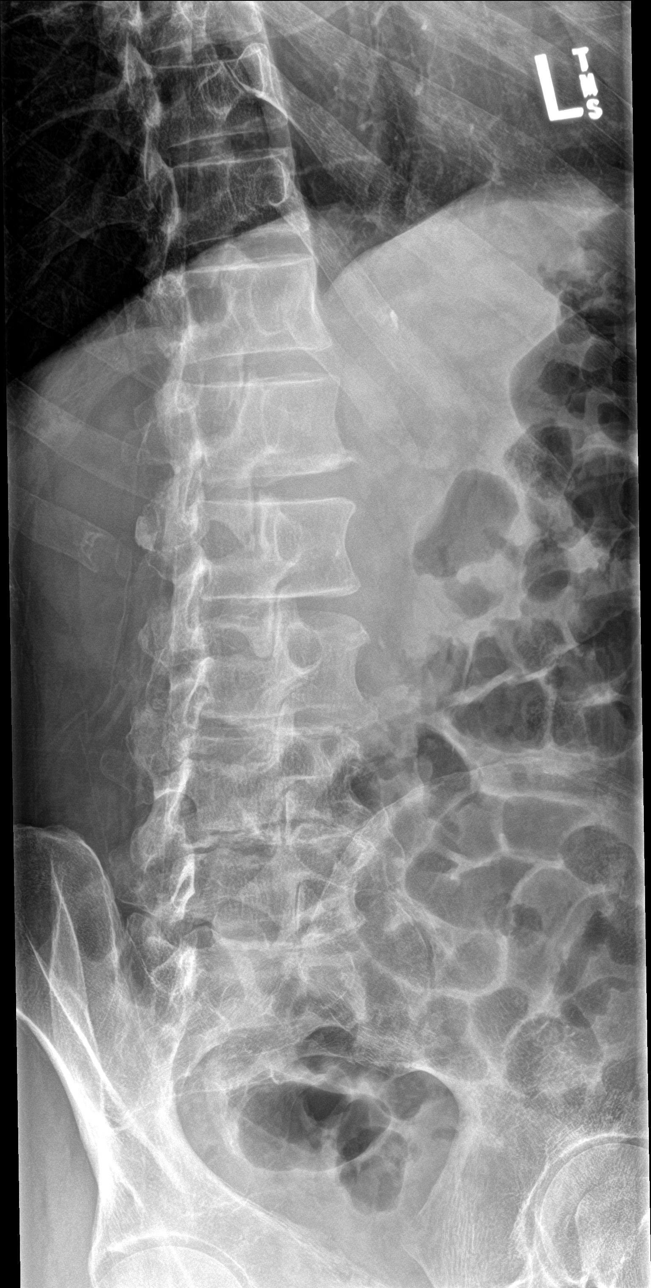

[l-spine lat]
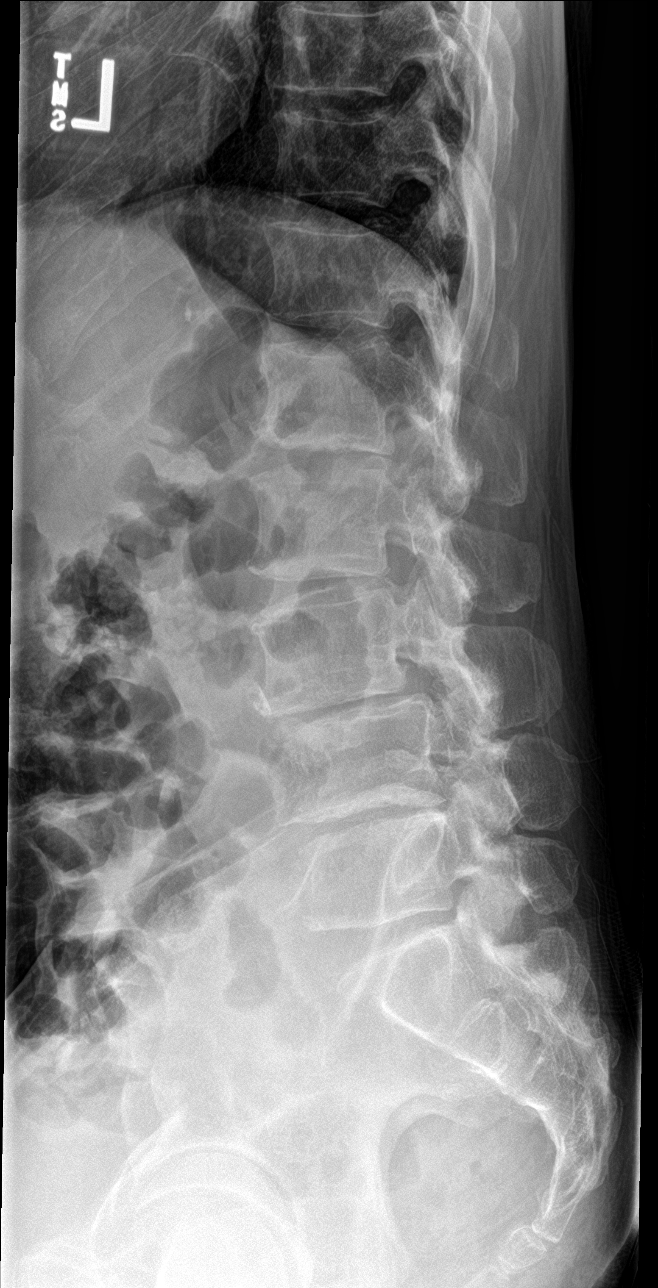

[l-spine spot]
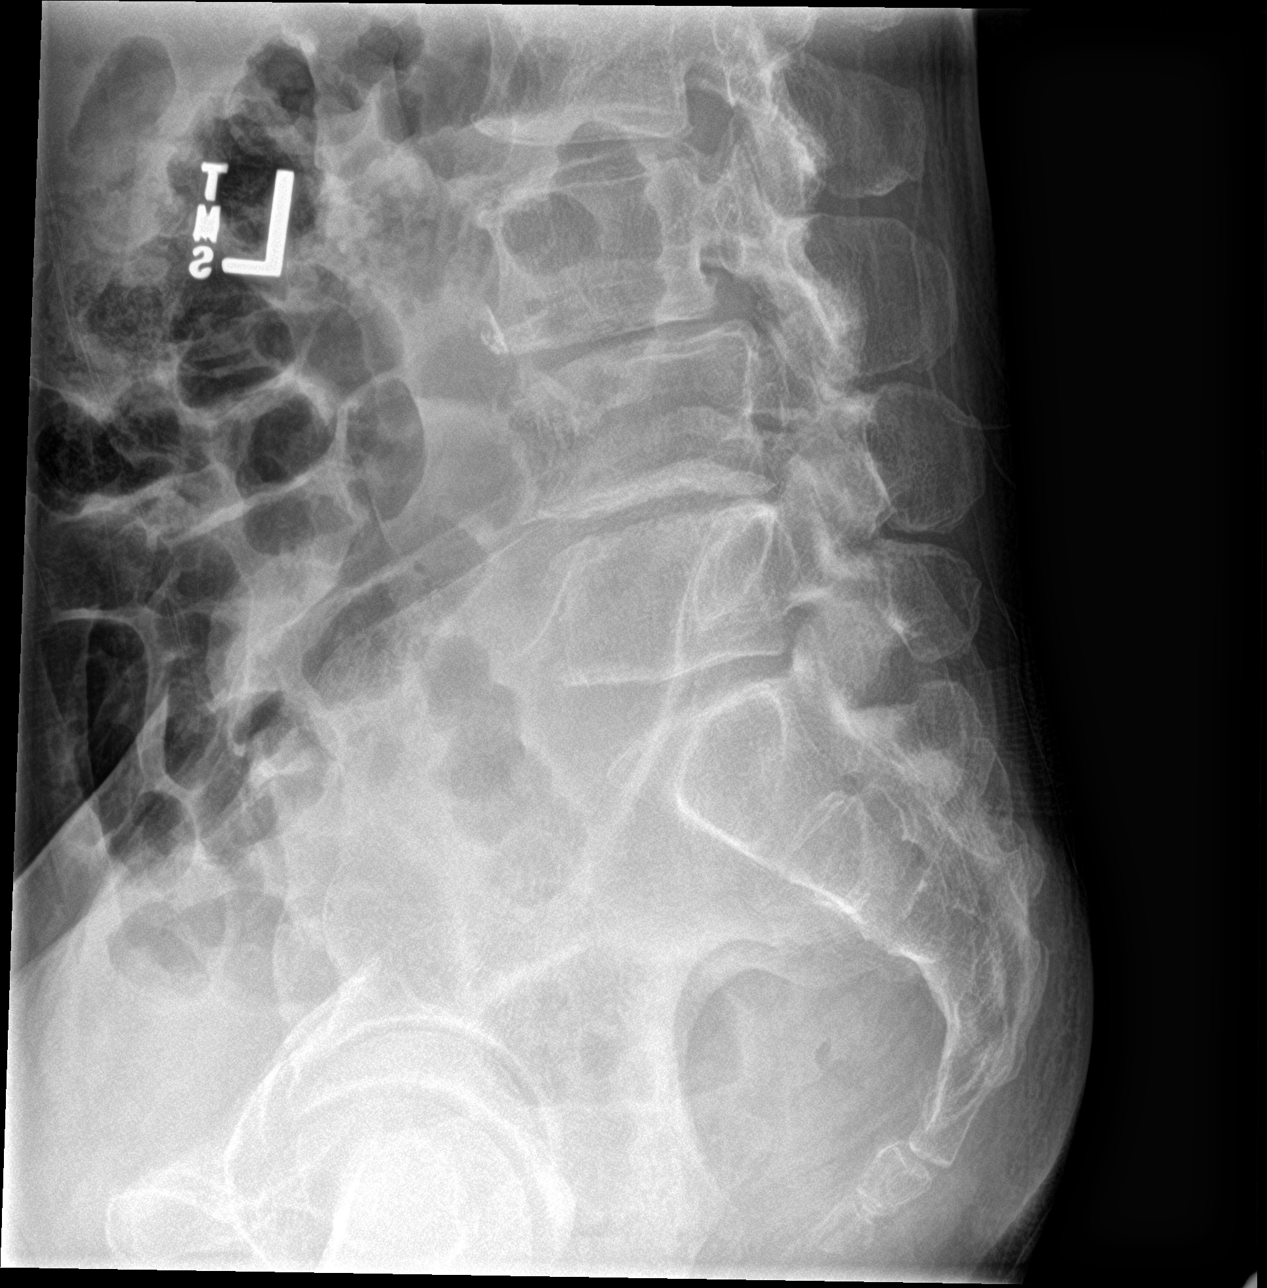

[5 of 5 positions shown; findings below may reference images not displayed]

FINDINGS: No fracture.  No bone lesion.

Grade 1 anterolisthesis of L3 on L4, stable from the prior study. No
other spondylolisthesis.

Moderate loss of disc height at L3-L4 and L4-L5. Mild loss of disc
height at L1-L2, L2-L3 and L5-S1. Disc degenerative changes have
mildly advanced compared to the prior exam. There are facet
degenerative changes along the mid to lower lumbar spine. Bones are
demineralized.

Soft tissues are unremarkable.
IMPRESSION: 1. No fracture or acute finding.
2. Degenerative changes as described which have mildly advanced
compared to the prior exam.
